# Patient Record
Sex: Male | Born: 1945
Health system: Southern US, Community
[De-identification: ages and names within clinical notes are randomized; demographics above are authoritative.]

## PROBLEM LIST (undated history)

## (undated) DIAGNOSIS — R269 Unspecified abnormalities of gait and mobility: Secondary | ICD-10-CM

## (undated) DIAGNOSIS — M4712 Other spondylosis with myelopathy, cervical region: Secondary | ICD-10-CM

## (undated) DIAGNOSIS — G20 Parkinson's disease: Secondary | ICD-10-CM

## (undated) DIAGNOSIS — G4700 Insomnia, unspecified: Secondary | ICD-10-CM

## (undated) DIAGNOSIS — F32A Depression, unspecified: Secondary | ICD-10-CM

## (undated) DIAGNOSIS — F4310 Post-traumatic stress disorder, unspecified: Secondary | ICD-10-CM

## (undated) DIAGNOSIS — F329 Major depressive disorder, single episode, unspecified: Secondary | ICD-10-CM

## (undated) DIAGNOSIS — M549 Dorsalgia, unspecified: Secondary | ICD-10-CM

## (undated) HISTORY — DX: Unspecified abnormalities of gait and mobility: R26.9

## (undated) HISTORY — DX: Post-traumatic stress disorder, unspecified: F43.10

## (undated) HISTORY — PX: SKIN CANCER EXCISION: SHX779

## (undated) HISTORY — DX: Other spondylosis with myelopathy, cervical region: M47.12

## (undated) HISTORY — PX: OTHER SURGICAL HISTORY: SHX169

## (undated) HISTORY — DX: Insomnia, unspecified: G47.00

## (undated) HISTORY — DX: Dorsalgia, unspecified: M54.9

## (undated) HISTORY — DX: Depression, unspecified: F32.A

## (undated) HISTORY — DX: Parkinson's disease: G20

## (undated) HISTORY — PX: SCALP LESION REMOVAL W/ FLAP AND SKIN GRAFT: SHX2376

## (undated) HISTORY — DX: Major depressive disorder, single episode, unspecified: F32.9

---

## 2003-09-30 ENCOUNTER — Other Ambulatory Visit: Payer: Self-pay

## 2006-12-11 ENCOUNTER — Ambulatory Visit: Payer: Self-pay | Admitting: General Surgery

## 2007-01-29 ENCOUNTER — Ambulatory Visit: Payer: Self-pay | Admitting: Specialist

## 2010-02-28 ENCOUNTER — Ambulatory Visit: Payer: Self-pay | Admitting: Orthopedic Surgery

## 2010-08-13 ENCOUNTER — Ambulatory Visit: Payer: Self-pay | Admitting: General Surgery

## 2010-08-27 ENCOUNTER — Ambulatory Visit: Payer: Self-pay | Admitting: General Surgery

## 2010-08-27 LAB — HM COLONOSCOPY

## 2014-01-06 ENCOUNTER — Encounter: Payer: Self-pay | Admitting: Neurology

## 2014-01-06 ENCOUNTER — Ambulatory Visit (INDEPENDENT_AMBULATORY_CARE_PROVIDER_SITE_OTHER): Payer: Non-veteran care | Admitting: Neurology

## 2014-01-06 VITALS — BP 163/79 | HR 66 | Ht 72.5 in | Wt 207.0 lb

## 2014-01-06 DIAGNOSIS — R269 Unspecified abnormalities of gait and mobility: Secondary | ICD-10-CM

## 2014-01-06 DIAGNOSIS — R209 Unspecified disturbances of skin sensation: Secondary | ICD-10-CM | POA: Diagnosis not present

## 2014-01-06 DIAGNOSIS — M4712 Other spondylosis with myelopathy, cervical region: Secondary | ICD-10-CM

## 2014-01-06 HISTORY — DX: Other spondylosis with myelopathy, cervical region: M47.12

## 2014-01-06 NOTE — Patient Instructions (Signed)

## 2014-01-06 NOTE — Progress Notes (Signed)
Reason for visit: Cervical myelopathy  Darren Thomas. is a 68 y.o. male  History of present illness:  Darren Thomas is a 68 year old right-handed white male with a history of a cervical myelopathy. The patient began having symptoms around 2011 with numbness in the right hand. The patient felt to have carpal tunnel syndrome, and he underwent a decompressive surgery for this without benefit. The sensory symptoms progressed to include all 4 extremities, and he also developed a gait disorder with spasticity in both legs. He eventually was found to have multilevel cervical spine disease, with cord compression and injury at the C4-5 level. The patient underwent a multilevel posterior laminectomy, but following surgery, his gait disorder and sensory symptoms did not resolve. The patient indicates that over time, he has had some gradual progression of some of the numbness in the hands and legs, and he continues to have an occasional shock sensations down the arms and legs. The patient will have episodes where the legs will take out and go into spasms. The patient denies issues controlling the bowels or the bladder, but he does have some chronic constipation issues. The patient has difficulty using his hands because of the numbness. The patient denies any falls, and he does not use a cane or a walker for ambulation. He comes to this office for an evaluation of the persistent symptoms. MRI evaluation done in April 2015 was brought for my review, and it does show evidence of spinal cord injury with high signal intensity at the C4-5 level within the spinal cord. The patient has had good decompression of the spinal canal.  Past Medical History  Diagnosis Date  . Back pain   . Cervical spondylosis with myelopathy 01/06/2014  . Abnormality of gait     Past Surgical History  Procedure Laterality Date  . C-spine      C-3-7 removeed    Family History  Problem Relation Age of Onset  . Cancer Mother   .  Heart Problems Father     Social history:  reports that he has quit smoking. His smoking use included Cigarettes. He smoked 1.00 pack per day. He has never used smokeless tobacco. He reports that he does not drink alcohol or use illicit drugs.  Medications:  No current outpatient prescriptions on file prior to visit.   No current facility-administered medications on file prior to visit.     No Known Allergies  ROS:  Out of a complete 14 system review of symptoms, the patient complains only of the following symptoms, and all other reviewed systems are negative.  Blurred vision Constipation Numbness, weakness  Blood pressure 163/79, pulse 66, height 6' 0.5" (1.842 m), weight 207 lb (93.895 kg).  Physical Exam  General: The patient is alert and cooperative at the time of the examination.  Eyes: Pupils are equal, round, and reactive to light. Discs are flat bilaterally.  Neck: The neck is supple, no carotid bruits are noted.  Respiratory: The respiratory examination is clear.  Cardiovascular: The cardiovascular examination reveals a regular rate and rhythm, no obvious murmurs or rubs are noted.  Skin: Extremities are without significant edema.  Neurologic Exam  Mental status: The patient is alert and oriented x 3 at the time of the examination. The patient has apparent normal recent and remote memory, with an apparently normal attention span and concentration ability.  Cranial nerves: Facial symmetry is not present. There is slight ptosis of the left eye. There is good sensation of  the face to pinprick and soft touch bilaterally. The strength of the facial muscles and the muscles to head turning and shoulder shrug are normal bilaterally. Speech is well enunciated, no aphasia or dysarthria is noted. Extraocular movements are full. Visual fields are full. The tongue is midline, and the patient has symmetric elevation of the soft palate. No obvious hearing deficits are  noted.  Motor: The motor testing reveals 5 over 5 strength of all 4 extremities. Good symmetric motor tone is noted throughout.  Sensory: Sensory testing is intact to pinprick, soft touch, vibration sensation, and position sense on all 4 extremities, with the exception that there is a stocking pattern pinprick sensory deficit in the distal third of the legs bilaterally. Vibration sensation is slightly decreased on the left hand relative to the right, symmetric in the feet. No evidence of extinction is noted.  Coordination: Cerebellar testing reveals good finger-nose-finger and heel-to-shin bilaterally.  Gait and station: Gait is slightly wide-based, with a spastic-type gait, left greater than right lower extremity. Tandem gait is unsteady. Romberg is negative. No drift is seen.  Reflexes: Deep tendon reflexes are symmetric, but the biceps reflexes are depressed bilaterally, triceps reflexes and lower extremity reflexes are elevated bilaterally. Toes are downgoing bilaterally.   Assessment/Plan:  One. Cervical myelopathy  2. Gait disorder  The patient does have evidence of spinal cord injury by MRI of the cervical spine. The patient likely has permanent deficits associated with sensory alteration on all 4 extremities, and spasticity in both legs. A small proportion of individuals will have some progression of symptoms following decompressive surgery. The patient may benefit from a low dose of baclofen for the spasticity in the legs. He does not have any true weakness of the extremities. The patient will undergo blood work looking for other etiologies of his sensory and motor complaints. The patient will followup through this office if needed. He gets his medical care through the [facility].  Jill Alexanders MD 01/08/2014 3:24 PM  [facility] [address] [place], [place] [address]  Phone [phone] Fax [phone]

## 2014-01-12 LAB — COPPER, SERUM: Copper: 88 ug/dL (ref 72–166)

## 2014-01-12 LAB — HIV ANTIBODY (ROUTINE TESTING W REFLEX): HIV-1/HIV-2 Ab: NONREACTIVE

## 2014-01-12 LAB — ANA W/REFLEX: Anti Nuclear Antibody(ANA): NEGATIVE

## 2014-01-12 LAB — HTLV-I/II ANTIBODIES, QUAL.: HTLV I/II Ab: NEGATIVE

## 2014-01-12 LAB — VITAMIN B12: VITAMIN B 12: 1277 pg/mL — AB (ref 211–946)

## 2014-01-12 LAB — LYME, TOTAL AB TEST/REFLEX: Lyme IgG/IgM Ab: <0.91 {ISR} (ref 0.00–0.90)

## 2014-01-12 LAB — RPR: RPR: NONREACTIVE

## 2014-01-12 LAB — ANGIOTENSIN CONVERTING ENZYME: ANGIO CONVERT ENZYME: 40 U/L (ref 14–82)

## 2014-01-12 LAB — METHYLMALONIC ACID, SERUM: METHYLMALONIC ACID: 164 nmol/L (ref 0–378)

## 2014-01-12 NOTE — Progress Notes (Signed)
Quick Note:  I called and gave him results of labs.(unremarkable). He verbalized understanding. ______

## 2014-01-25 ENCOUNTER — Ambulatory Visit: Payer: Non-veteran care | Admitting: Neurology

## 2014-03-29 ENCOUNTER — Encounter: Payer: Self-pay | Admitting: Neurology

## 2014-04-04 ENCOUNTER — Encounter: Payer: Self-pay | Admitting: Neurology

## 2014-05-24 DIAGNOSIS — L821 Other seborrheic keratosis: Secondary | ICD-10-CM | POA: Diagnosis not present

## 2014-05-24 DIAGNOSIS — D1800 Hemangioma unspecified site: Secondary | ICD-10-CM | POA: Diagnosis not present

## 2014-05-24 DIAGNOSIS — Z1283 Encounter for screening for malignant neoplasm of skin: Secondary | ICD-10-CM | POA: Diagnosis not present

## 2014-05-24 DIAGNOSIS — D179 Benign lipomatous neoplasm, unspecified: Secondary | ICD-10-CM | POA: Diagnosis not present

## 2014-05-24 DIAGNOSIS — L814 Other melanin hyperpigmentation: Secondary | ICD-10-CM | POA: Diagnosis not present

## 2014-05-24 DIAGNOSIS — I8310 Varicose veins of unspecified lower extremity with inflammation: Secondary | ICD-10-CM | POA: Diagnosis not present

## 2014-05-24 DIAGNOSIS — Z85828 Personal history of other malignant neoplasm of skin: Secondary | ICD-10-CM | POA: Diagnosis not present

## 2014-05-24 DIAGNOSIS — L578 Other skin changes due to chronic exposure to nonionizing radiation: Secondary | ICD-10-CM | POA: Diagnosis not present

## 2014-06-01 DIAGNOSIS — G629 Polyneuropathy, unspecified: Secondary | ICD-10-CM | POA: Diagnosis not present

## 2014-06-01 DIAGNOSIS — Z13220 Encounter for screening for lipoid disorders: Secondary | ICD-10-CM | POA: Diagnosis not present

## 2014-06-01 DIAGNOSIS — Z136 Encounter for screening for cardiovascular disorders: Secondary | ICD-10-CM | POA: Diagnosis not present

## 2014-06-01 DIAGNOSIS — Z9181 History of falling: Secondary | ICD-10-CM | POA: Diagnosis not present

## 2014-06-01 DIAGNOSIS — Z131 Encounter for screening for diabetes mellitus: Secondary | ICD-10-CM | POA: Diagnosis not present

## 2014-06-01 DIAGNOSIS — Z125 Encounter for screening for malignant neoplasm of prostate: Secondary | ICD-10-CM | POA: Diagnosis not present

## 2014-06-01 DIAGNOSIS — Z1389 Encounter for screening for other disorder: Secondary | ICD-10-CM | POA: Diagnosis not present

## 2014-06-01 DIAGNOSIS — R739 Hyperglycemia, unspecified: Secondary | ICD-10-CM | POA: Diagnosis not present

## 2014-09-12 DIAGNOSIS — J309 Allergic rhinitis, unspecified: Secondary | ICD-10-CM | POA: Diagnosis not present

## 2014-09-12 DIAGNOSIS — H6121 Impacted cerumen, right ear: Secondary | ICD-10-CM | POA: Diagnosis not present

## 2014-11-20 ENCOUNTER — Telehealth: Payer: Self-pay | Admitting: Family Medicine

## 2014-11-20 MED ORDER — FLUTICASONE PROPIONATE 50 MCG/ACT NA SUSP: 1.0000 | Freq: Every day | NASAL | Status: DC

## 2014-11-20 NOTE — Telephone Encounter (Signed)
Requesting 4M supply of Fluticasone sent to [facility].

## 2014-11-20 NOTE — Telephone Encounter (Signed)
Patient would like a 3 month supply sent to mail order pharmacy.

## 2014-11-21 NOTE — Telephone Encounter (Signed)
Refill Request.  

## 2014-11-22 MED ORDER — FLUTICASONE PROPIONATE 50 MCG/ACT NA SUSP: 2.0000 | Freq: Every day | NASAL | Status: DC

## 2014-11-22 NOTE — Addendum Note (Signed)
Addended by: Steele Sizer F on: 11/22/2014 10:03 AM   Modules accepted: Orders

## 2015-05-30 DIAGNOSIS — L812 Freckles: Secondary | ICD-10-CM | POA: Diagnosis not present

## 2015-05-30 DIAGNOSIS — L821 Other seborrheic keratosis: Secondary | ICD-10-CM | POA: Diagnosis not present

## 2015-05-30 DIAGNOSIS — D229 Melanocytic nevi, unspecified: Secondary | ICD-10-CM | POA: Diagnosis not present

## 2015-05-30 DIAGNOSIS — L820 Inflamed seborrheic keratosis: Secondary | ICD-10-CM | POA: Diagnosis not present

## 2015-05-30 DIAGNOSIS — Z85828 Personal history of other malignant neoplasm of skin: Secondary | ICD-10-CM | POA: Diagnosis not present

## 2015-05-30 DIAGNOSIS — L578 Other skin changes due to chronic exposure to nonionizing radiation: Secondary | ICD-10-CM | POA: Diagnosis not present

## 2015-05-30 DIAGNOSIS — Z1283 Encounter for screening for malignant neoplasm of skin: Secondary | ICD-10-CM | POA: Diagnosis not present

## 2015-05-30 DIAGNOSIS — I8312 Varicose veins of left lower extremity with inflammation: Secondary | ICD-10-CM | POA: Diagnosis not present

## 2015-05-30 DIAGNOSIS — I8311 Varicose veins of right lower extremity with inflammation: Secondary | ICD-10-CM | POA: Diagnosis not present

## 2015-09-10 ENCOUNTER — Other Ambulatory Visit: Payer: Self-pay | Admitting: Family Medicine

## 2015-09-11 NOTE — Telephone Encounter (Signed)
Patient requesting refill. 

## 2015-09-25 ENCOUNTER — Encounter: Payer: Commercial Managed Care - HMO | Admitting: Family Medicine

## 2016-02-05 ENCOUNTER — Encounter: Payer: Self-pay | Admitting: Family Medicine

## 2016-02-05 ENCOUNTER — Ambulatory Visit (INDEPENDENT_AMBULATORY_CARE_PROVIDER_SITE_OTHER): Payer: Commercial Managed Care - HMO | Admitting: Family Medicine

## 2016-02-05 VITALS — BP 116/42 | HR 51 | Temp 98.1°F | Wt 201.1 lb

## 2016-02-05 DIAGNOSIS — Z23 Encounter for immunization: Secondary | ICD-10-CM | POA: Diagnosis not present

## 2016-02-05 DIAGNOSIS — R0982 Postnasal drip: Secondary | ICD-10-CM

## 2016-02-05 DIAGNOSIS — J329 Chronic sinusitis, unspecified: Secondary | ICD-10-CM | POA: Diagnosis not present

## 2016-02-05 DIAGNOSIS — J3089 Other allergic rhinitis: Principal | ICD-10-CM

## 2016-02-05 DIAGNOSIS — J309 Allergic rhinitis, unspecified: Secondary | ICD-10-CM

## 2016-02-05 DIAGNOSIS — J302 Other seasonal allergic rhinitis: Secondary | ICD-10-CM | POA: Insufficient documentation

## 2016-02-05 MED ORDER — AMOXICILLIN-POT CLAVULANATE 875-125 MG PO TABS: 1.0000 | ORAL_TABLET | Freq: Two times a day (BID) | ORAL | 0 refills | Status: DC

## 2016-02-05 MED ORDER — B-12 1000 MCG SL SUBL: 1.0000 | SUBLINGUAL_TABLET | Freq: Every day | SUBLINGUAL | 0 refills | Status: DC

## 2016-02-05 MED ORDER — LORATADINE 10 MG PO TABS: 10.0000 mg | ORAL_TABLET | Freq: Every day | ORAL | 11 refills | Status: DC

## 2016-02-05 NOTE — Progress Notes (Signed)
Name: Darren Thomas.   MRN: [id number]    DOB: 04-30-1946   Date:02/05/2016       Progress Note  Subjective  Chief Complaint  Chief Complaint  Patient presents with  . Sinusitis    drainage    HPI  AR: he states he has been using flonase and loratadine every day, however over the past couple of months symptoms are getting worse, he has more post-nasal drainage through the day and has to constant clear his throat, and bothers him at night. The mucus is white, no fever, chills , change in appetite or facial pain   Patient Active Problem List   Diagnosis Date Noted  . Perennial allergic rhinitis with seasonal variation 02/05/2016  . Cervical spondylosis with myelopathy 01/06/2014    Past Surgical History:  Procedure Laterality Date  . C-spine     C-3-7 removeed    Family History  Problem Relation Age of Onset  . Cancer Mother   . Heart Problems Father     Social History   Social History  . Marital status: Married    Spouse name: N/A  . Number of children: 2  . Years of education: GED   Occupational History  . retired    Social History Main Topics  . Smoking status: Former Smoker    Packs/day: 1.00    Types: Cigarettes  . Smokeless tobacco: Never Used  . Alcohol use No     Comment: former  . Drug use: No  . Sexual activity: Not on file   Other Topics Concern  . Not on file   Social History Narrative  . No narrative on file     Current Outpatient Prescriptions:  .  fluticasone (FLONASE) 50 MCG/ACT nasal spray, USE 2 SPRAYS IN EACH NOSTRIL EVERY DAY, Disp: 48 g, Rfl: 1 .  gabapentin (NEURONTIN) 400 MG capsule, Take 3,600 mg by mouth daily. Three 400 mg caps 3 times daily, Disp: , Rfl:  .  Multiple Vitamins-Minerals (COMPLETE MULTIVITAMIN/MINERAL PO), Take 1 tablet by mouth daily., Disp: , Rfl:  .  naproxen sodium (ANAPROX) 220 MG tablet, Take 220 mg by mouth 2 (two) times daily with a meal., Disp: , Rfl:  .  Omega-3 Fatty Acids (FISH OIL) 1200 MG CAPS,  Take 3 capsules by mouth daily., Disp: , Rfl:  .  polyethylene glycol (MIRALAX / GLYCOLAX) packet, Take 17 g by mouth every other day., Disp: , Rfl:  .  Cyanocobalamin (B-12) 1000 MCG SUBL, Place 1 tablet under the tongue daily., Disp: 30 each, Rfl: 0 .  FIBER LAXATIVE 625 MG tablet, Take 1 tablet by mouth daily., Disp: , Rfl:  .  loratadine (CLARITIN) 10 MG tablet, Take 1 tablet (10 mg total) by mouth daily., Disp: 30 tablet, Rfl: 11 .  STOOL SOFTENER 100 MG capsule, Take 100 mg by mouth daily., Disp: , Rfl:   No Known Allergies   ROS  Ten systems reviewed and is negative except as mentioned in HPI   Objective  Vitals:   02/05/16 1547  BP: (!) 116/42  Pulse: (!) 51  Temp: 98.1 F (36.7 C)  SpO2: 96%  Weight: 201 lb 1.6 oz (91.2 kg)    Body mass index is 26.9 kg/m.  Physical Exam  Constitutional: Patient appears well-developed and well-nourished.No distress.  HEENT: head atraumatic, normocephalic, pupils equal and reactive to light, ears normal,  neck supple, throat within normal limits, white post-nasal drainage, no tenderness during percussion of sinus  Cardiovascular: Normal  rate, regular rhythm and normal heart sounds.  No murmur heard. No BLE edema. Pulmonary/Chest: Effort normal and breath sounds normal. No respiratory distress. Abdominal: Soft.  There is no tenderness. Psychiatric: Patient has a normal mood and affect. behavior is normal. Judgment and thought content normal.  PHQ2/9: Depression screen PHQ 2/9 02/05/2016  Decreased Interest 0  Down, Depressed, Hopeless 0  PHQ - 2 Score 0     Fall Risk: Fall Risk  02/05/2016  Falls in the past year? No     Functional Status Survey: Is the patient deaf or have difficulty hearing?: Yes Does the patient have difficulty seeing, even when wearing glasses/contacts?: Yes (glasses) Does the patient have difficulty concentrating, remembering, or making decisions?: Yes Does the patient have difficulty walking or  climbing stairs?: No Does the patient have difficulty dressing or bathing?: No Does the patient have difficulty doing errands alone such as visiting a doctor's office or shopping?: No    Assessment & Plan  1. Perennial allergic rhinitis with seasonal variation  - loratadine (CLARITIN) 10 MG tablet; Take 1 tablet (10 mg total) by mouth daily.  Dispense: 30 tablet; Refill: 11  2. Needs flu shot  - Flu vaccine HIGH DOSE PF  3. Post-nasal drainage  - amoxicillin-clavulanate (AUGMENTIN) 875-125 MG tablet; Take 1 tablet by mouth 2 (two) times daily.  Dispense: 28 tablet; Refill: 0

## 2016-02-08 ENCOUNTER — Telehealth: Payer: Self-pay

## 2016-02-08 NOTE — Telephone Encounter (Signed)
Patient called stating since taking Augmentin he has been experiencing Diarrhea. Just wanted advise on what to do?

## 2016-02-08 NOTE — Telephone Encounter (Signed)
Try probiotics and if no improvement we will change antibiotics

## 2016-05-14 LAB — BASIC METABOLIC PANEL
BUN: 22 mg/dL — AB (ref 4–21)
CREATININE: 1 mg/dL (ref 0.6–1.3)
Glucose: 79 mg/dL
Potassium: 4.7 mmol/L (ref 3.4–5.3)
SODIUM: 141 mmol/L (ref 137–147)

## 2016-05-14 LAB — HEPATIC FUNCTION PANEL
ALK PHOS: 75 U/L (ref 25–125)
ALT: 13 U/L (ref 10–40)
AST: 15 U/L (ref 14–40)
Bilirubin, Total: 0.7 mg/dL

## 2016-05-14 LAB — CBC AND DIFFERENTIAL
HEMATOCRIT: 44 % (ref 41–53)
Hemoglobin: 15.1 g/dL (ref 13.5–17.5)
PLATELETS: 166 10*3/uL (ref 150–399)
WBC: 5.4 10^3/mL

## 2016-05-14 LAB — TSH: TSH: 1.48 u[IU]/mL (ref 0.41–5.90)

## 2016-05-14 LAB — PSA: PSA: 0.6

## 2016-06-06 ENCOUNTER — Encounter: Payer: Self-pay | Admitting: Family Medicine

## 2016-06-06 ENCOUNTER — Ambulatory Visit (INDEPENDENT_AMBULATORY_CARE_PROVIDER_SITE_OTHER): Payer: Commercial Managed Care - HMO | Admitting: Family Medicine

## 2016-06-06 VITALS — BP 114/58 | HR 81 | Temp 97.9°F | Resp 18 | Ht 72.0 in | Wt 190.1 lb

## 2016-06-06 DIAGNOSIS — R634 Abnormal weight loss: Secondary | ICD-10-CM

## 2016-06-06 DIAGNOSIS — R0982 Postnasal drip: Secondary | ICD-10-CM

## 2016-06-06 DIAGNOSIS — J302 Other seasonal allergic rhinitis: Secondary | ICD-10-CM | POA: Diagnosis not present

## 2016-06-06 DIAGNOSIS — M4712 Other spondylosis with myelopathy, cervical region: Secondary | ICD-10-CM | POA: Diagnosis not present

## 2016-06-06 DIAGNOSIS — J3089 Other allergic rhinitis: Secondary | ICD-10-CM | POA: Diagnosis not present

## 2016-06-06 MED ORDER — AZITHROMYCIN 500 MG PO TABS: 500.0000 mg | ORAL_TABLET | Freq: Every day | ORAL | 0 refills | Status: DC

## 2016-06-06 MED ORDER — LORATADINE 10 MG PO TABS: 10.0000 mg | ORAL_TABLET | Freq: Every day | ORAL | 3 refills | Status: DC

## 2016-06-06 MED ORDER — FLUTICASONE PROPIONATE 50 MCG/ACT NA SUSP: 2.0000 | Freq: Every day | NASAL | 1 refills | Status: DC

## 2016-06-06 NOTE — Progress Notes (Signed)
Name: Darren Thomas.   MRN: [id number]    DOB: 09/05/1945   Date:06/06/2016       Progress Note  Subjective  Chief Complaint  Chief Complaint  Patient presents with  . Sinus Problem    Patient states he is having the same symptoms as in September, Post nasal drainage, coughing-clear mucus, congestion.   . Weight Loss    Would like to discuss weight loss- has lost 11 pounds without trying    HPI  Post-nasal drainage:  He was seen in September 2017 with allergic rhinitis symptoms but severe post-nasal drainage, he was given Augmentin but he was only able to take 3 days because it caused diarrhea. He states symptoms improved slightly but much worse over the past month. He feels like he is choking on his post-nasal drainage, no facial pressure or fever, no taste sensation change. He has been using allergy medication as prescribed.   Weight loss: he states he has lost weight, unintentionally over the past 4 months. He states he had been stable at 200 lbs weight for over one year and his weight has dropped 11 lbs since last visit in 01/2016. He noticed a change in his pant and shirt size. He went to the [place] this month and had multiple labs done. He denies change in appetite,change in bowel movements, no change in moles. He has been off gabapentin for the past month because it was causing swelling, but he states pain is not much different. He was given Cymbalta but felt like it was causing swelling on his throat , so he stopped medication.    Patient Active Problem List   Diagnosis Date Noted  . Perennial allergic rhinitis with seasonal variation 02/05/2016  . Cervical spondylosis with myelopathy 01/06/2014    Past Surgical History:  Procedure Laterality Date  . C-spine     C-3-7 removeed    Family History  Problem Relation Age of Onset  . Cancer Mother   . Heart Problems Father     Social History   Social History  . Marital status: Married    Spouse name: N/A  . Number of  children: 2  . Years of education: GED   Occupational History  . retired    Social History Main Topics  . Smoking status: Former Smoker    Packs/day: 1.00    Types: Cigarettes  . Smokeless tobacco: Never Used  . Alcohol use No     Comment: former  . Drug use: No  . Sexual activity: Not on file   Other Topics Concern  . Not on file   Social History Narrative  . No narrative on file     Current Outpatient Prescriptions:  .  Cyanocobalamin (B-12) 1000 MCG SUBL, Place 1 tablet under the tongue daily., Disp: 30 each, Rfl: 0 .  fluticasone (FLONASE) 50 MCG/ACT nasal spray, Place 2 sprays into both nostrils daily., Disp: 48 g, Rfl: 1 .  loratadine (CLARITIN) 10 MG tablet, Take 1 tablet (10 mg total) by mouth daily., Disp: 90 tablet, Rfl: 3 .  Multiple Vitamins-Minerals (COMPLETE MULTIVITAMIN/MINERAL PO), Take 1 tablet by mouth daily., Disp: , Rfl:  .  naproxen sodium (ANAPROX) 220 MG tablet, Take 220 mg by mouth 2 (two) times daily with a meal., Disp: , Rfl:  .  Omega-3 Fatty Acids (FISH OIL) 1200 MG CAPS, Take 3 capsules by mouth daily., Disp: , Rfl:  .  polyethylene glycol (MIRALAX / GLYCOLAX) packet, Take 17 g by mouth  every other day., Disp: , Rfl:  .  azithromycin (ZITHROMAX) 500 MG tablet, Take 1 tablet (500 mg total) by mouth daily., Disp: 3 tablet, Rfl: 0  Allergies  Allergen Reactions  . Augmentin [Amoxicillin-Pot Clavulanate] Diarrhea     ROS  Ten systems reviewed and is negative except as mentioned in HPI   Objective  Vitals:   06/06/16 1550  BP: (!) 114/58  Pulse: 81  Resp: 18  Temp: 97.9 F (36.6 C)  TempSrc: Oral  SpO2: 97%  Weight: 190 lb 1.6 oz (86.2 kg)  Height: 6' (1.829 m)    Body mass index is 25.78 kg/m.  Physical Exam  Constitutional: Patient appears well-developed and well-nourished.  No distress.  HEENT: head atraumatic, normocephalic, pupils equal and reactive to light, ears normal TM bilaterally, boggy turbinates, no tenderness  during percussion of sinus , neck supple, throat within normal limits, post-nasal drainage - white in color  Cardiovascular: Normal rate, regular rhythm and normal heart sounds.  No murmur heard. No BLE edema. Pulmonary/Chest: Effort normal and breath sounds normal. No respiratory distress. Abdominal: Soft.  There is no tenderness. Psychiatric: Patient has a normal mood and affect. behavior is normal. Judgment and thought content normal.  PHQ2/9: Depression screen [facility] 2/9 06/06/2016 02/05/2016  Decreased Interest 0 0  Down, Depressed, Hopeless 0 0  PHQ - 2 Score 0 0    Fall Risk: Fall Risk  06/06/2016 02/05/2016  Falls in the past year? No No     Functional Status Survey: Is the patient deaf or have difficulty hearing?: Yes (bilateral hearing aids) Does the patient have difficulty seeing, even when wearing glasses/contacts?: No Does the patient have difficulty concentrating, remembering, or making decisions?: No Does the patient have difficulty walking or climbing stairs?: No Does the patient have difficulty dressing or bathing?: No Does the patient have difficulty doing errands alone such as visiting a doctor's office or shopping?: No    Assessment & Plan  1. Post-nasal drainage  - azithromycin (ZITHROMAX) 500 MG tablet; Take 1 tablet (500 mg total) by mouth daily.  Dispense: 3 tablet; Refill: 0  2. Perennial allergic rhinitis with seasonal variation  - loratadine (CLARITIN) 10 MG tablet; Take 1 tablet (10 mg total) by mouth daily.  Dispense: 90 tablet; Refill: 3 - fluticasone (FLONASE) 50 MCG/ACT nasal spray; Place 2 sprays into both nostrils daily.  Dispense: 48 g; Refill: 1  3. Abnormal weight loss  He went to [place] this month and had multiple labs done, advised to return for follow up with a copy of his labs so we can decide what other labs/evaluation he needs  4. Cervical spondylosis with myelopathy  He is off gabapentin and cymbalta, he has numbness on both hands and he  has a daily pain 5-6/10 on average, no change since he stopped medication

## 2016-06-11 DIAGNOSIS — L908 Other atrophic disorders of skin: Secondary | ICD-10-CM | POA: Diagnosis not present

## 2016-06-11 DIAGNOSIS — N6009 Solitary cyst of unspecified breast: Secondary | ICD-10-CM | POA: Diagnosis not present

## 2016-06-11 DIAGNOSIS — L578 Other skin changes due to chronic exposure to nonionizing radiation: Secondary | ICD-10-CM | POA: Diagnosis not present

## 2016-06-11 DIAGNOSIS — C44212 Basal cell carcinoma of skin of right ear and external auricular canal: Secondary | ICD-10-CM | POA: Diagnosis not present

## 2016-06-11 DIAGNOSIS — L814 Other melanin hyperpigmentation: Secondary | ICD-10-CM | POA: Diagnosis not present

## 2016-06-20 ENCOUNTER — Encounter: Payer: Self-pay | Admitting: Family Medicine

## 2016-06-20 ENCOUNTER — Ambulatory Visit (INDEPENDENT_AMBULATORY_CARE_PROVIDER_SITE_OTHER): Payer: Commercial Managed Care - HMO | Admitting: Family Medicine

## 2016-06-20 VITALS — BP 132/68 | HR 90 | Temp 98.1°F | Resp 16 | Ht 72.0 in | Wt 190.9 lb

## 2016-06-20 DIAGNOSIS — R634 Abnormal weight loss: Secondary | ICD-10-CM

## 2016-06-20 DIAGNOSIS — D692 Other nonthrombocytopenic purpura: Secondary | ICD-10-CM

## 2016-06-20 DIAGNOSIS — G4700 Insomnia, unspecified: Secondary | ICD-10-CM | POA: Insufficient documentation

## 2016-06-20 DIAGNOSIS — F419 Anxiety disorder, unspecified: Secondary | ICD-10-CM | POA: Diagnosis not present

## 2016-06-20 DIAGNOSIS — G4709 Other insomnia: Secondary | ICD-10-CM

## 2016-06-20 DIAGNOSIS — R0982 Postnasal drip: Secondary | ICD-10-CM | POA: Diagnosis not present

## 2016-06-20 DIAGNOSIS — F4310 Post-traumatic stress disorder, unspecified: Secondary | ICD-10-CM | POA: Insufficient documentation

## 2016-06-20 DIAGNOSIS — M4712 Other spondylosis with myelopathy, cervical region: Secondary | ICD-10-CM | POA: Diagnosis not present

## 2016-06-20 NOTE — Progress Notes (Signed)
Name: Darren Thomas.   MRN: [id number]    DOB: 03/11/1946   Date:06/20/2016       Progress Note  Subjective  Chief Complaint  Chief Complaint  Patient presents with  . Follow-up    2 week   . Weight Loss    Weight Unchanged from last visit.  . Insomnia    Only taking 1 Trazodone nightly, has not increased dosage up to 2 pills yet. Still waking up periodically throughout the night but not as bad as before medication.    HPI  Insomnia and anxiety: he is going to the [place] and was started on trazodone. He states he is only getting up at most twice during the night, he is getting evaluated by the [facility] to find out if he has PTSD  Weight loss: stable since last visit a couple of weeks ago, he brought labs from [place], and he had a normal CBC, TSH, comp panel and PSA. It may be secondary to anxiety also. Discussed high protein diet for now and frequent snacks.   Post-nasal drainage: doing better since he took Tri-pack and is taking Loratadine and using nasal steroid.    Patient Active Problem List   Diagnosis Date Noted  . Insomnia 06/20/2016  . Anxiety disorder 06/20/2016  . Perennial allergic rhinitis with seasonal variation 02/05/2016  . Cervical spondylosis with myelopathy 01/06/2014    Past Surgical History:  Procedure Laterality Date  . C-spine     C-3-7 removeed    Family History  Problem Relation Age of Onset  . Cancer Mother   . Heart Problems Father     Social History   Social History  . Marital status: Married    Spouse name: N/A  . Number of children: 2  . Years of education: GED   Occupational History  . retired    Social History Main Topics  . Smoking status: Former Smoker    Packs/day: 1.00    Types: Cigarettes  . Smokeless tobacco: Never Used  . Alcohol use No     Comment: former  . Drug use: No  . Sexual activity: Not on file   Other Topics Concern  . Not on file   Social History Narrative  . No narrative on file     Current Outpatient  Prescriptions:  .  Cyanocobalamin (B-12) 1000 MCG SUBL, Place 1 tablet under the tongue daily., Disp: 30 each, Rfl: 0 .  fluticasone (FLONASE) 50 MCG/ACT nasal spray, Place 2 sprays into both nostrils daily., Disp: 48 g, Rfl: 1 .  loratadine (CLARITIN) 10 MG tablet, Take 1 tablet (10 mg total) by mouth daily., Disp: 90 tablet, Rfl: 3 .  Multiple Vitamins-Minerals (COMPLETE MULTIVITAMIN/MINERAL PO), Take 1 tablet by mouth daily., Disp: , Rfl:  .  naproxen sodium (ANAPROX) 220 MG tablet, Take 220 mg by mouth 2 (two) times daily with a meal., Disp: , Rfl:  .  Omega-3 Fatty Acids (FISH OIL) 1200 MG CAPS, Take 3 capsules by mouth daily., Disp: , Rfl:  .  polyethylene glycol (MIRALAX / GLYCOLAX) packet, Take 17 g by mouth every other day., Disp: , Rfl:  .  traZODone (DESYREL) 50 MG tablet, Take 50-100 mg by mouth at bedtime as needed. for sleep, Disp: , Rfl: 0  Allergies  Allergen Reactions  . Augmentin [Amoxicillin-Pot Clavulanate] Diarrhea     ROS  Constitutional: Negative for fever or weight change.  Respiratory: Negative for cough and shortness of breath.   Cardiovascular: Negative for  chest pain or palpitations.  Gastrointestinal: Negative for abdominal pain, no bowel changes.  Musculoskeletal: Positive for gait problem but no joint swelling.  Skin: Negative for rash.  Neurological: Negative for dizziness or headache.  No other specific complaints in a complete review of systems (except as listed in HPI above).   Objective  Vitals:   06/20/16 1531  BP: 132/68  Pulse: 90  Resp: 16  Temp: 98.1 F (36.7 C)  TempSrc: Oral  SpO2: 97%  Weight: 190 lb 14.4 oz (86.6 kg)  Height: 6' (1.829 m)    Body mass index is 25.89 kg/m.  Physical Exam  Constitutional: Patient appears well-developed   No distress.  HEENT: head atraumatic, normocephalic, pupils equal and reactive to light, ears normal TM bilaterally, boggy turbinates,  Neck has decrease rom,  throat within normal  limits Cardiovascular: Normal rate, regular rhythm and normal heart sounds.  No murmur heard. No BLE edema. Pulmonary/Chest: Effort normal and breath sounds normal. No respiratory distress. Abdominal: Soft.  There is no tenderness. Psychiatric: Patient has a normal mood and affect. behavior is normal. Judgment and thought content normal. Skin: bruise on both arms  Recent Results (from the past 2160 hour(s))  CBC and differential     Status: None   Collection Time: 05/14/16 12:00 AM  Result Value Ref Range   Hemoglobin 15.1 13.5 - 17.5 g/dL   HCT 44 41 - 53 %   Platelets 166 150 - 399 K/L   WBC 5.4 34^9/ZP  Basic metabolic panel     Status: Abnormal   Collection Time: 05/14/16 12:00 AM  Result Value Ref Range   Glucose 79 mg/dL   BUN 22 (A) 4 - 21 mg/dL   Creatinine 1.0 0.6 - 1.3 mg/dL   Potassium 4.7 3.4 - 5.3 mmol/L   Sodium 141 137 - 147 mmol/L  Hepatic function panel     Status: None   Collection Time: 05/14/16 12:00 AM  Result Value Ref Range   Alkaline Phosphatase 75 25 - 125 U/L   ALT 13 10 - 40 U/L   AST 15 14 - 40 U/L   Bilirubin, Total 0.7 mg/dL  PSA     Status: None   Collection Time: 05/14/16 12:00 AM  Result Value Ref Range   PSA 0.6   TSH     Status: None   Collection Time: 05/14/16 12:00 AM  Result Value Ref Range   TSH 1.48 0.41 - 5.90 uIU/mL     PHQ2/9: Depression screen [facility] 2/9 06/06/2016 02/05/2016  Decreased Interest 0 0  Down, Depressed, Hopeless 0 0  PHQ - 2 Score 0 0     Fall Risk: Fall Risk  06/06/2016 02/05/2016  Falls in the past year? No No     Assessment & Plan  1. Other insomnia  [facility] and [facility] dose of Trazodone to 100 mg if needed.   2. Cervical spondylosis with myelopathy  Off gabapentin and has daily pain and paresthesia, he stopped medication because it was not helping with symptoms   3. Abnormal weight loss  Stable now, and labs reviewed   4. Post-nasal drainage  Improved with  antibiotics, continue allergy medication   5. Anxiety disorder, unspecified type  Getting evaluated for PTSD   6. Senile purpura ([place])  Reassurance

## 2016-11-05 ENCOUNTER — Other Ambulatory Visit: Payer: Self-pay | Admitting: Family Medicine

## 2016-11-05 DIAGNOSIS — J302 Other seasonal allergic rhinitis: Secondary | ICD-10-CM

## 2016-11-05 DIAGNOSIS — J3089 Other allergic rhinitis: Principal | ICD-10-CM

## 2016-11-05 NOTE — Telephone Encounter (Signed)
Patient requesting refill of Flonase to [facility].

## 2016-11-07 ENCOUNTER — Ambulatory Visit (INDEPENDENT_AMBULATORY_CARE_PROVIDER_SITE_OTHER): Payer: Medicare HMO | Admitting: Family Medicine

## 2016-11-07 ENCOUNTER — Encounter: Payer: Self-pay | Admitting: Family Medicine

## 2016-11-07 VITALS — BP 118/78 | HR 62 | Temp 98.2°F | Resp 16 | Ht 72.0 in | Wt 188.9 lb

## 2016-11-07 DIAGNOSIS — Z23 Encounter for immunization: Secondary | ICD-10-CM

## 2016-11-07 DIAGNOSIS — J3089 Other allergic rhinitis: Secondary | ICD-10-CM

## 2016-11-07 DIAGNOSIS — G4709 Other insomnia: Secondary | ICD-10-CM

## 2016-11-07 DIAGNOSIS — K5909 Other constipation: Secondary | ICD-10-CM | POA: Diagnosis not present

## 2016-11-07 DIAGNOSIS — R0982 Postnasal drip: Secondary | ICD-10-CM

## 2016-11-07 DIAGNOSIS — D692 Other nonthrombocytopenic purpura: Secondary | ICD-10-CM

## 2016-11-07 DIAGNOSIS — M4712 Other spondylosis with myelopathy, cervical region: Secondary | ICD-10-CM | POA: Diagnosis not present

## 2016-11-07 DIAGNOSIS — F419 Anxiety disorder, unspecified: Secondary | ICD-10-CM

## 2016-11-07 DIAGNOSIS — J302 Other seasonal allergic rhinitis: Secondary | ICD-10-CM

## 2016-11-07 NOTE — Progress Notes (Signed)
Name: Darren Thomas.   MRN: [id number]    DOB: 13-Mar-1946   Date:11/07/2016       Progress Note  Subjective  Chief Complaint  Chief Complaint  Patient presents with  . Nasal Congestion    Patient is having nasal drainage, dry throat and has to clear his throat 4-5x daily with white phlegm. Last medication that was prescribed gave him diarrhea and had to stop it.     HPI   Insomnia and anxiety: he is going to the [place] and was started on trazodone. He states he is only getting up at most twice during the night, he is getting evaluated by the [facility] to find out if he has PTSD  Weight loss: now weight has been stable past 4 months. He states at home 182 lbs- 184 lbs. Labs done and normal.   Post-nasal drainage: he states chronic problems, saw Dr. Pryor Ochoa in the past, taking Loratadine and using nasal steroid, every time he comes in he wants antibiotics, advised to follow up with Dr. Pryor Ochoa again. Antibiotics do not seem to clear his symptoms   Cervical spondylosis with  Myelopathy: he had surgery in May 2011, but neuropathy got worse after surgery, he tried multiple medications: Cymbalta, high dose Gabapentin and Lyrica, but symptoms did not improve and he stopped all medications. He states does not affect his strength but affects his balance at times and coordination.   Patient Active Problem List   Diagnosis Date Noted  . Chronic constipation 11/07/2016  . Insomnia 06/20/2016  . PTSD (post-traumatic stress disorder) 06/20/2016  . Perennial allergic rhinitis with seasonal variation 02/05/2016  . Cervical spondylosis with myelopathy 01/06/2014    Past Surgical History:  Procedure Laterality Date  . C-spine     C-3-7 removeed    Family History  Problem Relation Age of Onset  . Cancer Mother   . Heart Problems Father     Social History   Social History  . Marital status: Married    Spouse name: N/A  . Number of children: 2  . Years of education: GED   Occupational History   . retired    Social History Main Topics  . Smoking status: Former Smoker    Packs/day: 1.00    Types: Cigarettes  . Smokeless tobacco: Never Used  . Alcohol use No     Comment: former  . Drug use: No  . Sexual activity: Not on file   Other Topics Concern  . Not on file   Social History Narrative  . No narrative on file     Current Outpatient Prescriptions:  .  Cyanocobalamin (B-12) 1000 MCG SUBL, Place 1 tablet under the tongue daily., Disp: 30 each, Rfl: 0 .  escitalopram (LEXAPRO) 20 MG tablet, Take 20 mg by mouth daily., Disp: , Rfl: 2 .  fluticasone (FLONASE) 50 MCG/ACT nasal spray, USE 2 SPRAYS IN EACH NOSTRIL EVERY DAY, Disp: 48 g, Rfl: 1 .  loratadine (CLARITIN) 10 MG tablet, Take 1 tablet (10 mg total) by mouth daily., Disp: 90 tablet, Rfl: 3 .  Multiple Vitamins-Minerals (COMPLETE MULTIVITAMIN/MINERAL PO), Take 1 tablet by mouth daily., Disp: , Rfl:  .  naproxen sodium (ANAPROX) 220 MG tablet, Take 220 mg by mouth 2 (two) times daily with a meal., Disp: , Rfl:  .  Omega-3 Fatty Acids (FISH OIL) 1200 MG CAPS, Take 3 capsules by mouth daily., Disp: , Rfl:  .  polyethylene glycol (MIRALAX / GLYCOLAX) packet, Take 17 g by  mouth every other day., Disp: , Rfl:  .  traZODone (DESYREL) 50 MG tablet, Take 50-100 mg by mouth at bedtime as needed. for sleep, Disp: , Rfl: 0  Allergies  Allergen Reactions  . Augmentin [Amoxicillin-Pot Clavulanate] Diarrhea     ROS  Constitutional: Negative for fever or weight change.  Respiratory: Positive for occasional cough - from post-nasal drainage but no  shortness of breath.   Cardiovascular: Negative for chest pain or palpitations.  Gastrointestinal: Negative for abdominal pain, no bowel changes.  Musculoskeletal: Positive  for gait problem but no joint swelling.  Skin: Negative for rash.  Neurological: Negative for dizziness or headache.  No other specific complaints in a complete review of systems (except as listed in HPI  above).  Objective  Vitals:   11/07/16 1134  BP: 118/78  Pulse: 62  Resp: 16  Temp: 98.2 F (36.8 C)  TempSrc: Oral  SpO2: 96%  Weight: 188 lb 14.4 oz (85.7 kg)  Height: 6' (1.829 m)    Body mass index is 25.62 kg/m.  Physical Exam  Constitutional: Patient appears well-developed and well-nourished. No distress.  HEENT: head atraumatic, normocephalic, pupils equal and reactive to light,  neck supple, throat within normal limits Cardiovascular: Normal rate, regular rhythm and normal heart sounds.  No murmur heard. No BLE edema. Pulmonary/Chest: Effort normal and breath sounds normal. No respiratory distress. Abdominal: Soft.  There is no tenderness. Psychiatric: Patient has a normal mood and affect. behavior is normal. Judgment and thought content normal. Skin: upper arm purpura and stasis dermatitis lower leg Muscular skeletal: strong grip but has hand atrophy   PHQ2/9: Depression screen [facility] 2/9 06/06/2016 02/05/2016  Decreased Interest 0 0  Down, Depressed, Hopeless 0 0  PHQ - 2 Score 0 0     Fall Risk: Fall Risk  11/07/2016 06/06/2016 02/05/2016  Falls in the past year? No No No     Functional Status Survey: Is the patient deaf or have difficulty hearing?: No Does the patient have difficulty seeing, even when wearing glasses/contacts?: No Does the patient have difficulty concentrating, remembering, or making decisions?: No Does the patient have difficulty walking or climbing stairs?: No Does the patient have difficulty dressing or bathing?: No Does the patient have difficulty doing errands alone such as visiting a doctor's office or shopping?: No   Assessment & Plan  1. Cervical spondylosis with myelopathy  He stopped Gabapentin because it was not working, he continues to have numbness/tingling on hands and feet, with pain all the time. He has tried Cymbalta in the past also   2. Senile purpura (HCC)  stable  3. Perennial allergic rhinitis with seasonal  variation  He asks for antibiotics every time he comes in, advised to continue allergy medication   4. Anxiety disorder, unspecified type  [facility]  5. Other insomnia  Controlled with mediation  6. Chronic constipation  Doing well with Miralax   7. Need for pneumococcal vaccine  - Pneumococcal conjugate vaccine 13-valent IM  8. Post-nasal drainage  We will hold off on antibiotics at this time, go back to Dr. Pryor Ochoa   - Ambulatory referral to ENT

## 2016-12-08 DIAGNOSIS — J320 Chronic maxillary sinusitis: Secondary | ICD-10-CM | POA: Diagnosis not present

## 2016-12-08 DIAGNOSIS — J342 Deviated nasal septum: Secondary | ICD-10-CM | POA: Diagnosis not present

## 2016-12-08 DIAGNOSIS — H6123 Impacted cerumen, bilateral: Secondary | ICD-10-CM | POA: Diagnosis not present

## 2016-12-08 DIAGNOSIS — J301 Allergic rhinitis due to pollen: Secondary | ICD-10-CM | POA: Diagnosis not present

## 2016-12-19 ENCOUNTER — Ambulatory Visit: Payer: Commercial Managed Care - HMO | Admitting: Family Medicine

## 2016-12-31 DIAGNOSIS — J342 Deviated nasal septum: Secondary | ICD-10-CM | POA: Diagnosis not present

## 2016-12-31 DIAGNOSIS — J340 Abscess, furuncle and carbuncle of nose: Secondary | ICD-10-CM | POA: Diagnosis not present

## 2017-05-15 ENCOUNTER — Ambulatory Visit
Admission: RE | Admit: 2017-05-15 | Discharge: 2017-05-15 | Disposition: A | Discharge status: Home / Self Care | Payer: Medicare HMO | Source: Ambulatory Visit | Attending: Family Medicine | Admitting: Family Medicine

## 2017-05-15 ENCOUNTER — Ambulatory Visit (INDEPENDENT_AMBULATORY_CARE_PROVIDER_SITE_OTHER): Payer: Medicare HMO

## 2017-05-15 ENCOUNTER — Ambulatory Visit: Payer: Medicare HMO | Admitting: Family Medicine

## 2017-05-15 ENCOUNTER — Encounter: Payer: Medicare HMO | Admitting: Family Medicine

## 2017-05-15 ENCOUNTER — Encounter: Payer: Self-pay | Admitting: Family Medicine

## 2017-05-15 VITALS — BP 102/60 | HR 60 | Temp 98.0°F | Resp 16 | Ht 72.0 in | Wt 180.4 lb

## 2017-05-15 VITALS — BP 102/60 | HR 60 | Temp 98.0°F | Resp 12 | Ht 72.0 in | Wt 180.4 lb

## 2017-05-15 DIAGNOSIS — R739 Hyperglycemia, unspecified: Secondary | ICD-10-CM

## 2017-05-15 DIAGNOSIS — K5909 Other constipation: Secondary | ICD-10-CM | POA: Diagnosis not present

## 2017-05-15 DIAGNOSIS — F4310 Post-traumatic stress disorder, unspecified: Secondary | ICD-10-CM | POA: Diagnosis not present

## 2017-05-15 DIAGNOSIS — R634 Abnormal weight loss: Secondary | ICD-10-CM | POA: Diagnosis not present

## 2017-05-15 DIAGNOSIS — R1311 Dysphagia, oral phase: Secondary | ICD-10-CM | POA: Diagnosis not present

## 2017-05-15 DIAGNOSIS — D692 Other nonthrombocytopenic purpura: Secondary | ICD-10-CM

## 2017-05-15 DIAGNOSIS — R9431 Abnormal electrocardiogram [ECG] [EKG]: Secondary | ICD-10-CM | POA: Diagnosis not present

## 2017-05-15 DIAGNOSIS — M4712 Other spondylosis with myelopathy, cervical region: Secondary | ICD-10-CM

## 2017-05-15 DIAGNOSIS — Z87891 Personal history of nicotine dependence: Secondary | ICD-10-CM

## 2017-05-15 DIAGNOSIS — D696 Thrombocytopenia, unspecified: Secondary | ICD-10-CM | POA: Diagnosis not present

## 2017-05-15 DIAGNOSIS — E785 Hyperlipidemia, unspecified: Secondary | ICD-10-CM

## 2017-05-15 DIAGNOSIS — R64 Cachexia: Secondary | ICD-10-CM | POA: Diagnosis not present

## 2017-05-15 DIAGNOSIS — Z79899 Other long term (current) drug therapy: Secondary | ICD-10-CM | POA: Diagnosis not present

## 2017-05-15 DIAGNOSIS — Z0000 Encounter for general adult medical examination without abnormal findings: Secondary | ICD-10-CM | POA: Diagnosis not present

## 2017-05-15 NOTE — Patient Instructions (Signed)
Darren Thomas , Thank you for taking time to come for your Medicare Wellness Visit. I appreciate your ongoing commitment to your health goals. Please review the following plan we discussed and let me know if I can assist you in the future.   Screening recommendations/referrals: Colonoscopy: Completed colonoscopy 08/27/10. Repeat every 10 years. Lung: I will send a message to Darren Estelle, RN for this screening. She will call you if you qualify for this screening. Hepatitis C Screening: Completed 03/31/12 HIV Screening: Completed 01/06/14   Vision/Dental Exams: Recommended yearly ophthalmology/optometry visit for glaucoma screening and checkup Recommended yearly dental visit for hygiene and checkup  Vaccinations: Influenza vaccine: Declined. Please provide a copy of your immunization record. Pneumococcal vaccine: Completed PCV13 11/07/16 and PPSV23 02/20/10 Tdap vaccine: Declined. Please call your insurance company to determine your out of pocket expense. You may also receive this vaccine at your local pharmacy or Health Dept. Shingles vaccine: Completed Zostavax 08/12/12. Declined Shingrix. Please call your insurance company to determine your out of pocket expense. You may also receive this vaccine at your local pharmacy or Health Dept.  Advanced directives: Please bring a copy of your POA (Power of Attorney) and/or Living Will to your next appointment.   Conditions/risks identified: Recommend to drink at least 6-8 8oz glasses of water per day  Next appointment: You are scheduled to see Dr. Ancil Boozer on 05/19/17 @ 9:20am.   Please schedule your Annual Wellness Visit with your Nurse Health Advisor in one year.  Preventive Care 35 Years and Older, Male Preventive care refers to lifestyle choices and visits with your health care provider that can promote health and wellness. What does preventive care include?  A yearly physical exam. This is also called an annual well check.  Dental exams once or  twice a year.  Routine eye exams. Ask your health care provider how often you should have your eyes checked.  Personal lifestyle choices, including:  Daily care of your teeth and gums.  Regular physical activity.  Eating a healthy diet.  Avoiding tobacco and drug use.  Limiting alcohol use.  Practicing safe sex.  Taking low doses of aspirin every day.  Taking vitamin and mineral supplements as recommended by your health care provider. What happens during an annual well check? The services and screenings done by your health care provider during your annual well check will depend on your age, overall health, lifestyle risk factors, and family history of disease. Counseling  Your health care provider may ask you questions about your:  Alcohol use.  Tobacco use.  Drug use.  Emotional well-being.  Home and relationship well-being.  Sexual activity.  Eating habits.  History of falls.  Memory and ability to understand (cognition).  Work and work Statistician. Screening  You may have the following tests or measurements:  Height, weight, and BMI.  Blood pressure.  Lipid and cholesterol levels. These may be checked every 5 years, or more frequently if you are over 59 years old.  Skin check.  Lung cancer screening. You may have this screening every year starting at age 36 if you have a 30-pack-year history of smoking and currently smoke or have quit within the past 15 years.  Fecal occult blood test (FOBT) of the stool. You may have this test every year starting at age 83.  Flexible sigmoidoscopy or colonoscopy. You may have a sigmoidoscopy every 5 years or a colonoscopy every 10 years starting at age 18.  Prostate cancer screening. Recommendations will vary depending on  your family history and other risks.  Hepatitis C blood test.  Hepatitis B blood test.  Sexually transmitted disease (STD) testing.  Diabetes screening. This is done by checking your blood  sugar (glucose) after you have not eaten for a while (fasting). You may have this done every 1-3 years.  Abdominal aortic aneurysm (AAA) screening. You may need this if you are a current or former smoker.  Osteoporosis. You may be screened starting at age 43 if you are at high risk. Talk with your health care provider about your test results, treatment options, and if necessary, the need for more tests. Vaccines  Your health care provider may recommend certain vaccines, such as:  Influenza vaccine. This is recommended every year.  Tetanus, diphtheria, and acellular pertussis (Tdap, Td) vaccine. You may need a Td booster every 10 years.  Zoster vaccine. You may need this after age 62.  Pneumococcal 13-valent conjugate (PCV13) vaccine. One dose is recommended after age 18.  Pneumococcal polysaccharide (PPSV23) vaccine. One dose is recommended after age 80. Talk to your health care provider about which screenings and vaccines you need and how often you need them. This information is not intended to replace advice given to you by your health care provider. Make sure you discuss any questions you have with your health care provider. Document Released: 05/25/2015 Document Revised: 01/16/2016 Document Reviewed: 02/27/2015 Elsevier Interactive Patient Education  2017 Day Prevention in the Home Falls can cause injuries. They can happen to people of all ages. There are many things you can do to make your home safe and to help prevent falls. What can I do on the outside of my home?  Regularly fix the edges of walkways and driveways and fix any cracks.  Remove anything that might make you trip as you walk through a door, such as a raised step or threshold.  Trim any bushes or trees on the path to your home.  Use bright outdoor lighting.  Clear any walking paths of anything that might make someone trip, such as rocks or tools.  Regularly check to see if handrails are loose or  broken. Make sure that both sides of any steps have handrails.  Any raised decks and porches should have guardrails on the edges.  Have any leaves, snow, or ice cleared regularly.  Use sand or salt on walking paths during winter.  Clean up any spills in your garage right away. This includes oil or grease spills. What can I do in the bathroom?  Use night lights.  Install grab bars by the toilet and in the tub and shower. Do not use towel bars as grab bars.  Use non-skid mats or decals in the tub or shower.  If you need to sit down in the shower, use a plastic, non-slip stool.  Keep the floor dry. Clean up any water that spills on the floor as soon as it happens.  Remove soap buildup in the tub or shower regularly.  Attach bath mats securely with double-sided non-slip rug tape.  Do not have throw rugs and other things on the floor that can make you trip. What can I do in the bedroom?  Use night lights.  Make sure that you have a light by your bed that is easy to reach.  Do not use any sheets or blankets that are too big for your bed. They should not hang down onto the floor.  Have a firm chair that has side arms. You  can use this for support while you get dressed.  Do not have throw rugs and other things on the floor that can make you trip. What can I do in the kitchen?  Clean up any spills right away.  Avoid walking on wet floors.  Keep items that you use a lot in easy-to-reach places.  If you need to reach something above you, use a strong step stool that has a grab bar.  Keep electrical cords out of the way.  Do not use floor polish or wax that makes floors slippery. If you must use wax, use non-skid floor wax.  Do not have throw rugs and other things on the floor that can make you trip. What can I do with my stairs?  Do not leave any items on the stairs.  Make sure that there are handrails on both sides of the stairs and use them. Fix handrails that are  broken or loose. Make sure that handrails are as long as the stairways.  Check any carpeting to make sure that it is firmly attached to the stairs. Fix any carpet that is loose or worn.  Avoid having throw rugs at the top or bottom of the stairs. If you do have throw rugs, attach them to the floor with carpet tape.  Make sure that you have a light switch at the top of the stairs and the bottom of the stairs. If you do not have them, ask someone to add them for you. What else can I do to help prevent falls?  Wear shoes that:  Do not have high heels.  Have rubber bottoms.  Are comfortable and fit you well.  Are closed at the toe. Do not wear sandals.  If you use a stepladder:  Make sure that it is fully opened. Do not climb a closed stepladder.  Make sure that both sides of the stepladder are locked into place.  Ask someone to hold it for you, if possible.  Clearly mark and make sure that you can see:  Any grab bars or handrails.  First and last steps.  Where the edge of each step is.  Use tools that help you move around (mobility aids) if they are needed. These include:  Canes.  Walkers.  Scooters.  Crutches.  Turn on the lights when you go into a dark area. Replace any light bulbs as soon as they burn out.  Set up your furniture so you have a clear path. Avoid moving your furniture around.  If any of your floors are uneven, fix them.  If there are any pets around you, be aware of where they are.  Review your medicines with your doctor. Some medicines can make you feel dizzy. This can increase your chance of falling. Ask your doctor what other things that you can do to help prevent falls. This information is not intended to replace advice given to you by your health care provider. Make sure you discuss any questions you have with your health care provider. Document Released: 02/22/2009 Document Revised: 10/04/2015 Document Reviewed: 06/02/2014 Elsevier  Interactive Patient Education  2017 [employer].

## 2017-05-15 NOTE — Progress Notes (Signed)
Name: Darren Thomas.   MRN: [id number]    DOB: 1946/02/16   Date:05/15/2017       Progress Note  Subjective  Chief Complaint  Chief Complaint  Patient presents with  . Weight Loss  . Follow-up    HPI  Weight loss: he has been gradually losing weight, over the past year he has lost another 10 lbs. He denies any cough, SOB or change in bowel movements. He has noticed dysphagia only when he swallows large pills. He thinks secondary to having difficulty looking up ( from neck problems). Not dysphagia for liquids or solids. He has been more depressed and is seeing psychiatrist. Explained that it may be a factor on his weight loss. He would like to find out if there is any other causes for his weight loss.   Constipation: doing better lately, no straining or blood in stools.   Cervical myelopathy: he still has atrophy on hands, difficulty with his gait and has fallen once over the past year but states he tripped and does not want referral for PT.  He is seeing specialist through [facility]. He has daily pain but states medications have not helped him in the past    Patient Active Problem List   Diagnosis Date Noted  . Chronic constipation 11/07/2016  . Insomnia 06/20/2016  . PTSD (post-traumatic stress disorder) 06/20/2016  . Perennial allergic rhinitis with seasonal variation 02/05/2016  . Cervical spondylosis with myelopathy 01/06/2014    Past Surgical History:  Procedure Laterality Date  . C-spine     C-3-7 removeed  . SCALP LESION REMOVAL W/ FLAP AND SKIN GRAFT      Family History  Problem Relation Age of Onset  . Cancer Mother   . Heart Problems Father   . Asthma Brother     Social History   Socioeconomic History  . Marital status: Married    Spouse name: Derl Barrow  . Number of children: 2  . 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 Highest education level: 8th grade  Social Needs  . Financial resource strain: Not hard at all  . Food insecurity - worry: Never true  .  Food insecurity - inability: Never true  . Transportation needs - medical: No  . Transportation needs - non-medical: No  Occupational History  . Occupation: retired  Tobacco Use  . Smoking status: Former Smoker    Packs/day: 1.00    Years: 20.00    Pack years: 20.00    Types: Cigarettes    Last attempt to quit: 1973    Years since quitting: 46.0  . Smokeless tobacco: Never Used  . Tobacco comment: smoking cessation materials not required  Substance and Sexual Activity  . Alcohol use: No  . Drug use: No  . Sexual activity: Not Currently  Other Topics Concern  . Not on file  Social History Narrative  . Not on file     Current Outpatient Medications:  .  buPROPion (WELLBUTRIN XL) 300 MG 24 hr tablet, Take 300 mg by mouth daily., Disp: , Rfl:  .  Cyanocobalamin (B-12) 1000 MCG SUBL, Place 1 tablet under the tongue daily., Disp: 30 each, Rfl: 0 .  escitalopram (LEXAPRO) 20 MG tablet, Take 20 mg by mouth daily., Disp: , Rfl: 2 .  fluticasone (FLONASE) 50 MCG/ACT nasal spray, USE 2 SPRAYS IN EACH NOSTRIL EVERY DAY, Disp: 48 g, Rfl: 1 .  loratadine (CLARITIN) 10 MG tablet, Take 1 tablet (10 mg total) by mouth  daily., Disp: 90 tablet, Rfl: 3 .  Multiple Vitamins-Minerals (COMPLETE MULTIVITAMIN/MINERAL PO), Take 1 tablet by mouth daily., Disp: , Rfl:  .  naproxen sodium (ANAPROX) 220 MG tablet, Take 220 mg by mouth 2 (two) times daily with a meal., Disp: , Rfl:  .  Omega-3 Fatty Acids (FISH OIL) 1200 MG CAPS, Take 3 capsules by mouth daily., Disp: , Rfl:  .  polyethylene glycol (MIRALAX / GLYCOLAX) packet, Take 17 g by mouth every other day., Disp: , Rfl:  .  prazosin (MINIPRESS) 1 MG capsule, Take 5 mg by mouth at bedtime. Take 3 capsules at bedtime, Disp: , Rfl:  .  traZODone (DESYREL) 50 MG tablet, Take 50-100 mg by mouth at bedtime as needed. for sleep, Disp: , Rfl: 0  Allergies  Allergen Reactions  . Augmentin [Amoxicillin-Pot Clavulanate] Diarrhea     ROS  Constitutional:  Negative for fever , positive for weight change.  Respiratory: Negative for cough and shortness of breath.   Cardiovascular: Negative for chest pain or palpitations.  Gastrointestinal: Negative for abdominal pain, no bowel changes.  Musculoskeletal: Postive for gait problem - from cervical myelopathy  or joint swelling.  Skin: Negative for rash.  Neurological: Negative for dizziness or headache.  No other specific complaints in a complete review of systems (except as listed in HPI above).  Objective   Vitals:   05/15/17 1216  BP: 102/60  Pulse: 60  Resp: 16  Temp: 98 F (36.7 C)  TempSrc: Oral  SpO2: 98%  Weight: 180 lb 6.4 oz (81.8 kg)  Height: 6' (1.829 m)    Body mass index is 24.47 kg/m.  Physical Exam  Constitutional: Patient appears well-developed No distress.  HEENT: head atraumatic, normocephalic, pupils equal and reactive to light, right lazy eye, no thyromegaly, throat within normal limits Cardiovascular: Normal rate, regular rhythm and normal heart sounds.  No murmur heard. No BLE edema. Pulmonary/Chest: Effort normal and breath sounds normal. No respiratory distress. Abdominal: Soft.  There is no tenderness. Psychiatric: Patient has a normal mood and affect. behavior is normal. Judgment and thought content normal. Muscular Skeletal: slow gait, decrease rom of neck, atrophy both hands.   PHQ2/9: Depression screen [facility] 2/9 05/15/2017 06/06/2016 02/05/2016  Decreased Interest 1 0 0  Down, Depressed, Hopeless 1 0 0  PHQ - 2 Score 2 0 0  Altered sleeping 1 - -  Tired, decreased energy 1 - -  Change in appetite 1 - -  Feeling bad or failure about yourself  1 - -  Trouble concentrating 1 - -  Moving slowly or fidgety/restless 1 - -  Suicidal thoughts 1 - -  PHQ-9 Score 9 - -  Difficult doing work/chores Somewhat difficult - -   Seeing psychiatrist.    Fall Risk: Fall Risk  05/15/2017 11/07/2016 06/06/2016 02/05/2016  Falls in the past year? Yes No No No  Number  falls in past yr: 1 - - -  Injury with Fall? No - - -  Risk for fall due to : Impaired balance/gait;Impaired mobility;Impaired vision - - -  Risk for fall due to: Comment shuffling gait; wears eyeglasses - - -  Follow up Falls evaluation completed;Education provided;Falls prevention discussed - - -     Assessment & Plan  1. Weight loss  He wants to have evaluation for weight loss, including cancer screen - TSH - CBC with Differential/Platelet - COMPLETE METABOLIC PANEL WITH GFR - HIV antibody - PSA - C-reactive protein - Sedimentation rate - DG  Chest 2 View; Future  2. Senile purpura (HCC)  stable  3. Hyperglycemia  - Hemoglobin A1c  4. Dyslipidemia  - Lipid panel  5. PTSD (post-traumatic stress disorder)  Seeing a psychiatrist from [place]   6. Chronic constipation  better  7. History of tobacco use  - DG Chest 2 View; Future  8. Oral phase dysphagia  - SLP modified barium swallow; Future  9. Cervical spondylosis with myelopathy  Spinal cord injury disorder, through the [place]

## 2017-05-15 NOTE — Progress Notes (Signed)
Pt has had weight loss of approx. 2-3 pounds per week over the last few months. Noticed pt is down 8 pounds since 10/2016. Pt expressed concerns with both weight loss and nutritional intake. Denies N/V/D. Discussed pt concerns with Dr. Ancil Boozer. Agreed to see pt today for further evaluation.

## 2017-05-15 NOTE — Progress Notes (Signed)
Subjective:   Darren Thomas. is a 72 y.o. male who presents for Medicare Annual/Subsequent preventive examination.  Review of Systems:  N/A Cardiac Risk Factors include: advanced age (>80men, >2 women);dyslipidemia;male gender     Objective:    Vitals: BP 102/60 (BP Location: Left Arm, Patient Position: Sitting, Cuff Size: Normal)   Pulse 60   Temp 98 F (36.7 C) (Oral)   Resp 12   Ht 6' (1.829 m)   Wt 180 lb 6.4 oz (81.8 kg)   BMI 24.47 kg/m   Body mass index is 24.47 kg/m.  Advanced Directives 05/15/2017 11/07/2016 02/05/2016  Does Patient Have a Medical Advance Directive? Yes No No  Type of Paramedic of [place];Living will - -  Copy of [place] in Chart? No - copy requested - -   Pt has been provided with the "MOST" and "DNR" documents for his review. Pt has been advised that he will only need to complete the document that is most appropriate to suit his health care wishes. Once completed, pt has been advised to return the appropriate document to the office for the physician to review and sign. Verbalized acceptance and understanding.  Tobacco Social History   Tobacco Use  Smoking Status Former Smoker  . Packs/day: 1.00  . Years: 20.00  . Pack years: 20.00  . Types: Cigarettes  . Last attempt to quit: 1973  . Years since quitting: 46.0  Smokeless Tobacco Never Used  Tobacco Comment   smoking cessation materials not required     Counseling given: No Comment: smoking cessation materials not required   Clinical Intake:  Pre-visit preparation completed: Yes  Pain : No/denies pain  BMI - recorded: 24.47 Nutritional Status: BMI of 19-24  Normal Nutritional Risks: Has the patient had any N/V/D within the last 2 months?  No Does the patient have any non-healing wounds?  No Has the patient had any unintentional weight loss or weight gain?  Yes. Unintentional weight loss (Pt noticed decrease in weight around 2-3  pounds every week; loss of appetite) Diabetes: No  How often do you need to have someone help you when you read instructions, pamphlets, or other written materials from your doctor or pharmacy?: 1 - Never  Interpreter Needed?: No  Information entered by :: Darren Pickles, LPN  Past Medical History:  Diagnosis Date  . Abnormality of gait   . Back pain   . Cervical spondylosis with myelopathy 01/06/2014  . Insomnia   . PTSD (post-traumatic stress disorder)    Past Surgical History:  Procedure Laterality Date  . C-spine     C-3-7 removeed  . SCALP LESION REMOVAL W/ FLAP AND SKIN GRAFT     Family History  Problem Relation Age of Onset  . Cancer Mother   . Heart Problems Father   . Asthma Brother    Social History   Socioeconomic History  . Marital status: Married    Spouse name: Darren Thomas  . Number of children: 2  . 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 Highest education level: 8th grade  Social Needs  . Financial resource strain: Not hard at all  . Food insecurity - worry: Never true  . Food insecurity - inability: Never true  . Transportation needs - medical: No  . Transportation needs - non-medical: No  Occupational History  . Occupation: retired  Tobacco Use  . Smoking status: Former Smoker    Packs/day: 1.00    Years:  20.00    Pack years: 20.00    Types: Cigarettes    Last attempt to quit: 1973    Years since quitting: 46.0  . Smokeless tobacco: Never Used  . Tobacco comment: smoking cessation materials not required  Substance and Sexual Activity  . Alcohol use: No  . Drug use: No  . Sexual activity: Not Currently  Other Topics Concern  . None  Social History Narrative  . None    Outpatient Encounter Medications as of 05/15/2017  Medication Sig  . buPROPion (WELLBUTRIN XL) 300 MG 24 hr tablet Take 300 mg by mouth daily.  . Cyanocobalamin (B-12) 1000 MCG SUBL Place 1 tablet under the tongue daily.  Marland Kitchen escitalopram (LEXAPRO) 20 MG tablet Take 20 mg by  mouth daily.  . fluticasone (FLONASE) 50 MCG/ACT nasal spray USE 2 SPRAYS IN EACH NOSTRIL EVERY DAY  . loratadine (CLARITIN) 10 MG tablet Take 1 tablet (10 mg total) by mouth daily.  . Multiple Vitamins-Minerals (COMPLETE MULTIVITAMIN/MINERAL PO) Take 1 tablet by mouth daily.  . Omega-3 Fatty Acids (FISH OIL) 1200 MG CAPS Take 3 capsules by mouth daily.  . polyethylene glycol (MIRALAX / GLYCOLAX) packet Take 17 g by mouth every other day.  . prazosin (MINIPRESS) 1 MG capsule Take 5 mg by mouth at bedtime. Take 3 capsules at bedtime  . traZODone (DESYREL) 50 MG tablet Take 50-100 mg by mouth at bedtime as needed. for sleep  . naproxen sodium (ANAPROX) 220 MG tablet Take 220 mg by mouth 2 (two) times daily with a meal.   No facility-administered encounter medications on file as of 05/15/2017.     Activities of Daily Living In your present state of health, do you have any difficulty performing the following activities: 05/15/2017 11/07/2016  Hearing? N N  Comment wears bilateral hearing aids -  Vision? N N  Comment wears eyeglasses -  Difficulty concentrating or making decisions? Y N  Comment short term memory loss -  Walking or climbing stairs? Y N  Comment shuffling gait -  Dressing or bathing? N N  Doing errands, shopping? N N  Preparing Food and eating ? N -  Comment wears full set upper and lower dentures -  Using the Toilet? N -  In the past six months, have you accidently leaked urine? N -  Do you have problems with loss of bowel control? N -  Managing your Medications? Y -  Comment wife puts medications in pill box -  Managing your Finances? N -  Housekeeping or managing your Housekeeping? N -  Some recent data might be hidden    Patient Care Team: Darren Sizer, MD as PCP - General (Family Medicine)   Assessment:   This is a routine wellness examination for [name].  Exercise Activities and Dietary recommendations Current Exercise Habits: Structured exercise class, Type of  exercise: walking, Time (Minutes): 60, Frequency (Times/Week): 5, Weekly Exercise (Minutes/Week): 300, Intensity: Mild, Exercise limited by: None identified  Goals    . DIET - INCREASE WATER INTAKE     Recommend to drink at least 6-8 8oz glasses of water per day       Fall Risk Fall Risk  05/15/2017 11/07/2016 06/06/2016 02/05/2016  Falls in the past year? Yes No No No  Number falls in past yr: 1 - - -  Injury with Fall? No - - -  Risk for fall due to : Impaired balance/gait;Impaired mobility;Impaired vision - - -  Risk for fall due to: Comment  shuffling gait; wears eyeglasses - - -  Follow up Falls evaluation completed;Education provided;Falls prevention discussed - - -   Is the patient's home free of loose throw rugs in walkways, pet beds, electrical cords, etc?   yes      Grab bars in the bathroom? yes  Denies use of a shower chair      Handrails on the stairs?   yes, exterior stairs only      Adequate lighting?   yes   Denies use of a cane, walker or w/c. Denies use of an elevated toilet seat.  Timed Get Up and Go Performed: Yes. Pt ambulated 10 feet within 20 sec. Gait slow and steady, slight shuffling when he walks. No use of an assistive device. No intervention required at this time.   Depression Screen PHQ 2/9 Scores 05/15/2017 06/06/2016 02/05/2016  PHQ - 2 Score 2 0 0  PHQ- 9 Score 9 - -    Cognitive Function     6CIT Screen 05/15/2017  What Year? 0 points  What month? 0 points  What time? 0 points  Count back from 20 0 points  Months in reverse 0 points  Repeat phrase 6 points  Total Score 6    Immunization History  Administered Date(s) Administered  . Influenza, High Dose Seasonal PF 02/05/2016  . Influenza-Unspecified 02/27/2014  . Pneumococcal Conjugate-13 11/07/2016  . Pneumococcal Polysaccharide-23 02/20/2010  . Zoster 08/12/2012    Qualifies for Shingles Vaccine? No. Completed Zostavax 08/12/12. Due for Shingrix vaccine. Education has been provided regarding  the importance of this vaccine. Pt has been advised to call his insurance company to determine his out of pocket expense. Advised he/she may also receive this vaccine at his local pharmacy or Health Dept. Verbalized acceptance and understanding.  Due for Tdap vaccine. Declined my offer to administer today. Education has been provided regarding the importance of this vaccine but still declined. Pt has been advised to call his insurance company to determine his out of pocket expense. Advised he may also receive this vaccine at his local pharmacy or Health Dept. Verbalized acceptance and understanding.  Due for flu vaccine. Declined my offer to administer today. Pt states he has received this vaccine at [facility]. Advised to provide a copy of his vaccination record. Verbalized acceptance and understanding.  Screening Tests Health Maintenance  Topic Date Due  . INFLUENZA VACCINE  12/10/2016  . TETANUS/TDAP  05/15/2018 (Originally 07/30/1964)  . COLONOSCOPY  08/26/2020  . Hepatitis C Screening  Completed  . PNA vac Low Risk Adult  Completed   Cancer Screenings: Lung: Low Dose CT Chest recommended if Age 68-80 years, 30 pack-year currently smoking OR have quit w/in 15years. Patient does qualify. An Epic message has been sent to Burgess Estelle, RN (Oncology Nurse Navigator) regarding the possible need for this exam. Raquel Sarna will review the patient's chart and review all previous imaging, then will collaborate with radiology to determine if the patient truly qualifies for the exam. If the patient qualifies, Raquel Sarna will order the Low Dose CT of the chest and will call the patient to schedule the exam.  Colorectal: Completed colonoscopy 08/27/10. Repeat every 10 years.  Additional Screenings: HIV Screening: Completed 01/06/14 Hepatitis C Screening: Completed 03/31/12    Plan:   In preparation for this patient's Medicare Annual Wellness visit, I have personally reviewed the following information in the  patient's chart:  A. Medical and Surgical History B. Office visits, Hospitalizations and ER visits within the last 12 months  C. Radiology, Laboratory and Pathological Reports (including those records in [facility]) D. Health Maintenance for accuracy and any attached, scanned or relevant reports or letters E.  Immunization History F.  Upcoming referrals and appointments G. Advance directives and Code Status  I have personally addressed the Medicare Wellness Questionnaire with the patient and have noted and/or up dated the following information:  A.  Current medications (including OTC medications) B. Medication Allergies C. Medical and Surgical History D.  Physicians on the patient's care team [place] Physical activity G. Functional ability and status H. Nutritional status I. Risk for fall and preventative measures (including TUG test) J. Use of alcohol, tobacco or illicit drugs  K.          Screenings such as hearing, vision, cognitive and depression L. Advance Directives and Code Status M. Realistic Patient Goals N. Financial and Social strains, if any  In addition, I have reviewed and discussed with the patient, certain preventive protocols, quality metrics, and best practice recommendations. To ensure quality care and proper  continuity of care, any concerns that arose during the Medicare Wellness visit were addressed with the patient's physician. A written personalized care plan for preventive services as well as general preventive health recommendations were provided to patient.  Signed,  Aleatha Borer, LPN  I have reviewed this encounter including the documentation in this note and/or discussed this patient with the provider, Tasheema Perrone LPN. I am certifying that I agree with the content of this note as supervising physician.  Darren Sizer, MD [facility] 05/15/2017, 1:24 PM

## 2017-05-16 LAB — HEMOGLOBIN A1C
EAG (MMOL/L): 5 (calc)
Hgb A1c MFr Bld: 4.8 % of total Hgb (ref <5.7)
Mean Plasma Glucose: 91 (calc)

## 2017-05-16 LAB — CBC WITH DIFFERENTIAL/PLATELET
BASOS ABS: 32 {cells}/uL (ref 0–200)
BASOS PCT: 0.7 %
EOS PCT: 0.7 %
Eosinophils Absolute: 32 cells/uL (ref 15–500)
HEMATOCRIT: 39.8 % (ref 38.5–50.0)
HEMOGLOBIN: 13.5 g/dL (ref 13.2–17.1)
LYMPHS ABS: 833 {cells}/uL — AB (ref 850–3900)
MCH: 29.8 pg (ref 27.0–33.0)
MCHC: 33.9 g/dL (ref 32.0–36.0)
MCV: 87.9 fL (ref 80.0–100.0)
MPV: 11.8 fL (ref 7.5–12.5)
Monocytes Relative: 6.8 %
NEUTROS ABS: 3299 {cells}/uL (ref 1500–7800)
Neutrophils Relative %: 73.3 %
Platelets: 136 10*3/uL — ABNORMAL LOW (ref 140–400)
RBC: 4.53 10*6/uL (ref 4.20–5.80)
RDW: 13.2 % (ref 11.0–15.0)
Total Lymphocyte: 18.5 %
WBC mixed population: 306 cells/uL (ref 200–950)
WBC: 4.5 10*3/uL (ref 3.8–10.8)

## 2017-05-16 LAB — COMPLETE METABOLIC PANEL WITH GFR
AG Ratio: 1.6 (calc) (ref 1.0–2.5)
ALBUMIN MSPROF: 3.9 g/dL (ref 3.6–5.1)
ALKALINE PHOSPHATASE (APISO): 63 U/L (ref 40–115)
ALT: 9 U/L (ref 9–46)
AST: 14 U/L (ref 10–35)
BUN: 21 mg/dL (ref 7–25)
CALCIUM: 9 mg/dL (ref 8.6–10.3)
CO2: 32 mmol/L (ref 20–32)
CREATININE: 1.08 mg/dL (ref 0.70–1.18)
Chloride: 105 mmol/L (ref 98–110)
GFR, EST AFRICAN AMERICAN: 78 mL/min/{1.73_m2} (ref >=60)
GFR, EST NON AFRICAN AMERICAN: 68 mL/min/{1.73_m2} (ref >=60)
GLOBULIN: 2.4 g/dL (ref 1.9–3.7)
Glucose, Bld: 73 mg/dL (ref 65–99)
Potassium: 4.7 mmol/L (ref 3.5–5.3)
Sodium: 142 mmol/L (ref 135–146)
TOTAL PROTEIN: 6.3 g/dL (ref 6.1–8.1)
Total Bilirubin: 0.6 mg/dL (ref 0.2–1.2)

## 2017-05-16 LAB — C-REACTIVE PROTEIN: CRP: 0.8 mg/L (ref <8.0)

## 2017-05-16 LAB — TSH: TSH: 1.01 m[IU]/L (ref 0.40–4.50)

## 2017-05-16 LAB — LIPID PANEL
CHOLESTEROL: 143 mg/dL (ref <200)
HDL: 51 mg/dL (ref >40)
LDL CHOLESTEROL (CALC): 77 mg/dL
Non-HDL Cholesterol (Calc): 92 mg/dL (calc) (ref <130)
TRIGLYCERIDES: 66 mg/dL (ref <150)
Total CHOL/HDL Ratio: 2.8 (calc) (ref <5.0)

## 2017-05-16 LAB — SEDIMENTATION RATE: Sed Rate: 2 mm/h (ref 0–20)

## 2017-05-16 LAB — HIV ANTIBODY (ROUTINE TESTING W REFLEX): HIV 1&2 Ab, 4th Generation: NONREACTIVE

## 2017-05-16 LAB — PSA: PSA: 0.5 ng/mL (ref <=4.0)

## 2017-05-19 ENCOUNTER — Ambulatory Visit: Payer: Medicare HMO

## 2017-05-19 ENCOUNTER — Telehealth: Payer: Self-pay | Admitting: *Deleted

## 2017-05-19 ENCOUNTER — Ambulatory Visit (INDEPENDENT_AMBULATORY_CARE_PROVIDER_SITE_OTHER): Payer: Medicare HMO | Admitting: Family Medicine

## 2017-05-19 ENCOUNTER — Encounter: Payer: Self-pay | Admitting: Family Medicine

## 2017-05-19 VITALS — BP 110/64 | HR 72 | Temp 97.9°F | Resp 16 | Ht 72.0 in | Wt 177.3 lb

## 2017-05-19 DIAGNOSIS — F4310 Post-traumatic stress disorder, unspecified: Secondary | ICD-10-CM | POA: Diagnosis not present

## 2017-05-19 DIAGNOSIS — Z0000 Encounter for general adult medical examination without abnormal findings: Secondary | ICD-10-CM

## 2017-05-19 DIAGNOSIS — Z0001 Encounter for general adult medical examination with abnormal findings: Secondary | ICD-10-CM | POA: Diagnosis not present

## 2017-05-19 DIAGNOSIS — R64 Cachexia: Secondary | ICD-10-CM | POA: Diagnosis not present

## 2017-05-19 DIAGNOSIS — D696 Thrombocytopenia, unspecified: Secondary | ICD-10-CM

## 2017-05-19 DIAGNOSIS — R9431 Abnormal electrocardiogram [ECG] [EKG]: Secondary | ICD-10-CM | POA: Diagnosis not present

## 2017-05-19 NOTE — Telephone Encounter (Signed)
Received referral for low dose lung cancer screening CT scan. Message left at phone number listed in EMR for patient to call me back to facilitate scheduling scan.  

## 2017-05-19 NOTE — Patient Instructions (Signed)
Premier shakes You can also add protein bars and snacks between meals.

## 2017-05-19 NOTE — Progress Notes (Signed)
Name: Darren Thomas.   MRN: [id number]    DOB: 06-25-1945   Date:05/19/2017       Progress Note  Subjective  Chief Complaint  Chief Complaint  Patient presents with  . Annual Exam    HPI  Patient presents for annual CPE and follow up weight loss and lab results  Diet: eats small meals, but states appetite has not changed much recently, but losing weigh.  Exercise: not very active because of neuropathy  Thrombocytopenia: he has senile purpura but denies recent epistaxis, gum bleeding or blood in stools   Cachexia: he has lost almost 20 lbs in about 18 months. He was about 230 lbs years ago, he initially changed his diet and was not worried, but has been gradually been losing weight since. He states not as hungry but not sure why weight is dropping. Lost 3 lbs in the past week. Reviewed labs with him, discussed that he needs better control of his depression, PTSD ( goes to the [place]), increase caloric intake - boost shakes, protein bars, cheese, yogurt and return in one month for follow up   IPSS Questionnaire (AUA-7): Over the past month.   1)  How often have you had a sensation of not emptying your bladder completely after you finish urinating?  0 - Not at all  2)  How often have you had to urinate again less than two hours after you finished urinating? 1 - Less than 1 time in 5  3)  How often have you found you stopped and started again several times when you urinated?  0 - Not at all  4) How difficult have you found it to postpone urination?  3 - About half the time  5) How often have you had a weak urinary stream?  0 - Not at all  6) How often have you had to push or strain to begin urination?  0 - Not at all  7) How many times did you most typically get up to urinate from the time you went to bed until the time you got up in the morning?  0 - None  Total score:  0-7 mildly symptomatic   8-19 moderately symptomatic   20-35 severely symptomatic   Depression:  Depression screen  [facility] 2/9 05/15/2017 06/06/2016 02/05/2016  Decreased Interest 1 0 0  Down, Depressed, Hopeless 1 0 0  PHQ - 2 Score 2 0 0  Altered sleeping 1 - -  Tired, decreased energy 1 - -  Change in appetite 1 - -  Feeling bad or failure about yourself  1 - -  Trouble concentrating 1 - -  Moving slowly or fidgety/restless 1 - -  Suicidal thoughts 1 - -  PHQ-9 Score 9 - -  Difficult doing work/chores Somewhat difficult - -    Hypertension: BP Readings from Last 3 Encounters:  05/19/17 110/64  05/15/17 102/60  05/15/17 102/60    Obesity: Wt Readings from Last 3 Encounters:  05/19/17 177 lb 4.8 oz (80.4 kg)  05/15/17 180 lb 6.4 oz (81.8 kg)  05/15/17 180 lb 6.4 oz (81.8 kg)   BMI Readings from Last 3 Encounters:  05/19/17 24.05 kg/m  05/15/17 24.47 kg/m  05/15/17 24.47 kg/m     Lipids:  Lab Results  Component Value Date   CHOL 143 05/15/2017   Lab Results  Component Value Date   HDL 51 05/15/2017   No results found for: [facility] Lab Results  Component Value Date  TRIG 66 05/15/2017   Lab Results  Component Value Date   CHOLHDL 2.8 05/15/2017   No results found for: LDLDIRECT Glucose:  Glucose, Bld  Date Value Ref Range Status  05/15/2017 73 65 - 99 mg/dL Final    Comment:    .            Fasting reference interval .       Colorectal cancer: due for repeat in 2022  Prostate cancer: enlarged prostate on CT done 2012 , negative PSA  Lab Results  Component Value Date   PSA 0.5 05/15/2017   PSA 0.6 05/14/2016   Lung cancer:  Low Dose CT Chest recommended if Age 74-80 years, 30 pack-year currently smoking OR have quit w/in 15years. Patient does not qualify.   AAA:  The USPSTF recommends one-time screening with ultrasonography in men ages 75 to 52 years who have ever smoked He had CT abdomen in 2012 negative for aneurysm   Aspirin: taking  ECG:  Today    Patient Active Problem List   Diagnosis Date Noted  . Chronic constipation 11/07/2016  . Insomnia  06/20/2016  . PTSD (post-traumatic stress disorder) 06/20/2016  . Perennial allergic rhinitis with seasonal variation 02/05/2016  . Cervical spondylosis with myelopathy 01/06/2014    Past Surgical History:  Procedure Laterality Date  . C-spine     C-3-7 removed  . SCALP LESION REMOVAL W/ FLAP AND SKIN GRAFT      Family History  Problem Relation Age of Onset  . Cervical cancer Mother   . Heart Problems Father   . Asthma Brother   . Emphysema Brother        was a smoker    Social History   Socioeconomic History  . Marital status: Married    Spouse name: Derl Barrow  . Number of children: 2  . 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 Highest education level: 8th grade  Social Needs  . Financial resource strain: Not hard at all  . Food insecurity - worry: Never true  . Food insecurity - inability: Never true  . Transportation needs - medical: No  . Transportation needs - non-medical: No  Occupational History  . Occupation: retired  Tobacco Use  . Smoking status: Former Smoker    Packs/day: 1.00    Years: 20.00    Pack years: 20.00    Types: Cigarettes    Last attempt to quit: 1973    Years since quitting: 46.0  . Smokeless tobacco: Never Used  . Tobacco comment: smoking cessation materials not required  Substance and Sexual Activity  . Alcohol use: No  . Drug use: No  . Sexual activity: Not Currently  Other Topics Concern  . Not on file  Social History Narrative  . Not on file     Current Outpatient Medications:  .  buPROPion (WELLBUTRIN XL) 300 MG 24 hr tablet, Take 300 mg by mouth daily., Disp: , Rfl:  .  Cyanocobalamin (B-12) 1000 MCG SUBL, Place 1 tablet under the tongue daily., Disp: 30 each, Rfl: 0 .  escitalopram (LEXAPRO) 20 MG tablet, Take 20 mg by mouth daily., Disp: , Rfl: 2 .  fluticasone (FLONASE) 50 MCG/ACT nasal spray, USE 2 SPRAYS IN EACH NOSTRIL EVERY DAY, Disp: 48 g, Rfl: 1 .  loratadine (CLARITIN) 10 MG tablet, Take 1 tablet (10 mg total)  by mouth daily., Disp: 90 tablet, Rfl: 3 .  Multiple Vitamins-Minerals (COMPLETE MULTIVITAMIN/MINERAL PO), Take 1 tablet by mouth daily.,  Disp: , Rfl:  .  naproxen sodium (ANAPROX) 220 MG tablet, Take 220 mg by mouth 2 (two) times daily with a meal., Disp: , Rfl:  .  Omega-3 Fatty Acids (FISH OIL) 1200 MG CAPS, Take 3 capsules by mouth daily., Disp: , Rfl:  .  polyethylene glycol (MIRALAX / GLYCOLAX) packet, Take 17 g by mouth every other day., Disp: , Rfl:  .  prazosin (MINIPRESS) 1 MG capsule, Take 5 mg by mouth at bedtime. Take 3 capsules at bedtime, Disp: , Rfl:  .  traZODone (DESYREL) 50 MG tablet, Take 50-100 mg by mouth at bedtime as needed. for sleep, Disp: , Rfl: 0  Allergies  Allergen Reactions  . Augmentin [Amoxicillin-Pot Clavulanate] Diarrhea     ROS   Constitutional: Negative for fever, positive for weight change.  Respiratory: Positive  for intermittent cough but no  shortness of breath.   Cardiovascular: Negative for chest pain or palpitations.  Gastrointestinal: Negative for abdominal pain, no bowel changes.  Musculoskeletal: Positive for gait problem ( secondary to neuropathy) or joint swelling.  Skin: Negative for rash.  Neurological: Negative for dizziness or headache.  No other specific complaints in a complete review of systems (except as listed in HPI above).   Objective  Vitals:   05/19/17 0941  BP: 110/64  Pulse: 72  Resp: 16  Temp: 97.9 F (36.6 C)  TempSrc: Oral  SpO2: 96%  Weight: 177 lb 4.8 oz (80.4 kg)  Height: 6' (1.829 m)    Body mass index is 24.05 kg/m.  Physical Exam  Constitutional: Patient appears well-developed and thin. No distress.  HENT: Head: Normocephalic and atraumatic. Ears: B TMs ok, no erythema or effusion; Nose: Nose normal. Mouth/Throat: Oropharynx is clear and moist. No oropharyngeal exudate.  Eyes: Conjunctivae and EOM are normal. Pupils are equal, round, and reactive to light. No scleral icterus.  Neck: decrease  rom, difficulty with extension and rotation. No JVD present. No thyromegaly present.  Cardiovascular: Normal rate, regular rhythm and normal heart sounds.  No murmur heard. No BLE edema. Pulmonary/Chest: Effort normal and breath sounds normal. No respiratory distress. Abdominal: Soft. Bowel sounds are normal, no distension. There is no tenderness. no masses MALE GENITALIA: Normal descended testes bilaterally, no masses palpated, no hernias, no lesions, no discharge RECTAL: Prostate normal size and consistency, no rectal masses or hemorrhoids Musculoskeletal: Normal range of motion, no joint effusions. No gross deformities Neurological: he is alert and oriented to person, place, and time. No cranial nerve deficit. Coordination, speech are normal. He has severe atrophy on both hands He has slow gait  Skin: Skin is warm and dry. He has purpura on upper extremity  Psychiatric: Patient has a normal mood and affect. behavior is normal. Judgment and thought content normal.  Recent Results (from the past 2160 hour(s))  TSH     Status: None   Collection Time: 05/15/17 12:19 PM  Result Value Ref Range   TSH 1.01 0.40 - 4.50 mIU/L  CBC with Differential/Platelet     Status: Abnormal   Collection Time: 05/15/17 12:19 PM  Result Value Ref Range   WBC 4.5 3.8 - 10.8 Thousand/uL   RBC 4.53 4.20 - 5.80 Million/uL   Hemoglobin 13.5 13.2 - 17.1 g/dL   HCT 39.8 38.5 - 50.0 %   MCV 87.9 80.0 - 100.0 fL   MCH 29.8 27.0 - 33.0 pg   MCHC 33.9 32.0 - 36.0 g/dL   RDW 13.2 11.0 - 15.0 %   Platelets 136 (  L) 140 - 400 Thousand/uL   MPV 11.8 7.5 - 12.5 fL   Neutro Abs 3,299 1,500 - 7,800 cells/uL   Lymphs Abs 833 (L) 850 - 3,900 cells/uL   WBC mixed population 306 200 - 950 cells/uL   Eosinophils Absolute 32 15 - 500 cells/uL   Basophils Absolute 32 0 - 200 cells/uL   Neutrophils Relative % 73.3 %   Total Lymphocyte 18.5 %   Monocytes Relative 6.8 %   Eosinophils Relative 0.7 %   Basophils Relative 0.7 %   COMPLETE METABOLIC PANEL WITH GFR     Status: None   Collection Time: 05/15/17 12:19 PM  Result Value Ref Range   Glucose, Bld 73 65 - 99 mg/dL    Comment: .            Fasting reference interval .    BUN 21 7 - 25 mg/dL   Creat 1.08 0.70 - 1.18 mg/dL    Comment: For patients >38 years of age, the reference limit for Creatinine is approximately 13% higher for people identified as African-American. .    GFR, Est Non African American 68 > OR = 60 mL/min/1.35m2   GFR, Est African American 78 > OR = 60 mL/min/1.45m2   BUN/Creatinine Ratio NOT APPLICABLE 6 - 22 (calc)   Sodium 142 135 - 146 mmol/L   Potassium 4.7 3.5 - 5.3 mmol/L   Chloride 105 98 - 110 mmol/L   CO2 32 20 - 32 mmol/L   Calcium 9.0 8.6 - 10.3 mg/dL   Total Protein 6.3 6.1 - 8.1 g/dL   Albumin 3.9 3.6 - 5.1 g/dL   Globulin 2.4 1.9 - 3.7 g/dL (calc)   AG Ratio 1.6 1.0 - 2.5 (calc)   Total Bilirubin 0.6 0.2 - 1.2 mg/dL   Alkaline phosphatase (APISO) 63 40 - 115 U/L   AST 14 10 - 35 U/L   ALT 9 9 - 46 U/L  HIV antibody     Status: None   Collection Time: 05/15/17 12:19 PM  Result Value Ref Range   HIV 1&2 Ab, 4th Generation NON-REACTIVE NON-REACTI    Comment: HIV-1 antigen and HIV-1/HIV-2 antibodies were not detected. There is no laboratory evidence of HIV infection. Marland Kitchen PLEASE NOTE: This information has been disclosed to you from records whose confidentiality may be protected by state law.  If your state requires such protection, then the state law prohibits you from making any further disclosure of the information without the specific written consent of the person to whom it pertains, or as otherwise permitted by law. A general authorization for the release of medical or other information is NOT sufficient for this purpose. . For additional information please refer to http://education.[facility].com/faq/FAQ106 (This link is being provided for informational/ educational purposes only.) . Marland Kitchen The  performance of this assay has not been clinically validated in patients less than 33 years old. .   Hemoglobin A1c     Status: None   Collection Time: 05/15/17 12:19 PM  Result Value Ref Range   Hgb A1c MFr Bld 4.8 <5.7 % of total Hgb    Comment: For the purpose of screening for the presence of diabetes: . <5.7%       Consistent with the absence of diabetes 5.7-6.4%    Consistent with increased risk for diabetes             (prediabetes) > or =6.5%  Consistent with diabetes . This assay result is consistent with a decreased risk  of diabetes. . Currently, no consensus exists regarding use of hemoglobin A1c for diagnosis of diabetes in children. . According to American Diabetes Association (ADA) guidelines, hemoglobin A1c <7.0% represents optimal control in non-pregnant diabetic patients. Different metrics may apply to specific patient populations.  Standards of Medical Care in Diabetes(ADA). .    Mean Plasma Glucose 91 (calc)   eAG (mmol/L) 5.0 (calc)  Lipid panel     Status: None   Collection Time: 05/15/17 12:19 PM  Result Value Ref Range   Cholesterol 143 <200 mg/dL   HDL 51 >40 mg/dL   Triglycerides 66 <150 mg/dL   LDL Cholesterol (Calc) 77 mg/dL (calc)    Comment: Reference range: <100 . Desirable range <100 mg/dL for primary prevention;   <70 mg/dL for patients with CHD or diabetic patients  with > or = 2 CHD risk factors. Marland Kitchen LDL-C is now calculated using the Martin-Hopkins  calculation, which is a validated novel method providing  better accuracy than the Friedewald equation in the  estimation of LDL-C.  Cresenciano Genre et al. Annamaria Helling. 1093;235(57): 2061-2068  (http://education.[facility].com/faq/FAQ164)    Total CHOL/HDL Ratio 2.8 <5.0 (calc)   Non-HDL Cholesterol (Calc) 92 <130 mg/dL (calc)    Comment: For patients with diabetes plus 1 major ASCVD risk  factor, treating to a non-HDL-C goal of <100 mg/dL  (LDL-C of <70 mg/dL) is considered a therapeutic   option.   PSA     Status: None   Collection Time: 05/15/17 12:19 PM  Result Value Ref Range   PSA 0.5 < OR = 4.0 ng/mL    Comment: The total PSA value from this assay system is  standardized against the WHO standard. The test  result will be approximately 20% lower when compared  to the equimolar-standardized total PSA (Beckman  Coulter). Comparison of serial PSA results should be  interpreted with this fact in mind. . This test was performed using the Siemens  chemiluminescent method. Values obtained from  different assay methods cannot be used interchangeably. PSA levels, regardless of value, should not be interpreted as absolute evidence of the presence or absence of disease.   C-reactive protein     Status: None   Collection Time: 05/15/17 12:19 PM  Result Value Ref Range   CRP 0.8 <8.0 mg/L  Sedimentation rate     Status: None   Collection Time: 05/15/17 12:19 PM  Result Value Ref Range   Sed Rate 2 0 - 20 mm/h      PHQ2/9: Depression screen [facility] 2/9 05/15/2017 06/06/2016 02/05/2016  Decreased Interest 1 0 0  Down, Depressed, Hopeless 1 0 0  PHQ - 2 Score 2 0 0  Altered sleeping 1 - -  Tired, decreased energy 1 - -  Change in appetite 1 - -  Feeling bad or failure about yourself  1 - -  Trouble concentrating 1 - -  Moving slowly or fidgety/restless 1 - -  Suicidal thoughts 1 - -  PHQ-9 Score 9 - -  Difficult doing work/chores Somewhat difficult - -     Fall Risk: Fall Risk  05/15/2017 11/07/2016 06/06/2016 02/05/2016  Falls in the past year? Yes No No No  Number falls in past yr: 1 - - -  Injury with Fall? No - - -  Risk for fall due to : Impaired balance/gait;Impaired mobility;Impaired vision - - -  Risk for fall due to: Comment shuffling gait; wears eyeglasses - - -  Follow up Falls evaluation completed;Education provided;Falls prevention discussed - - -  Assessment & Plan  1. Annual physical exam  Discussed importance of 150 minutes of physical  activity weekly, eat two servings of fish weekly, eat one serving of tree nuts ( cashews, pistachios, pecans, almonds.Marland Kitchen) every other day, eat 6 servings of fruit/vegetables daily and drink plenty of water and avoid sweet beverages.   2. Cachexia ([place])  He lost another 3 lbs in the past week, reviewed labs with him today, explained he needs to increase caloric intake, explained it may also been from depression   3. PTSD (post-traumatic stress disorder)  Keep follow at [facility]   4. Thrombocytopenia ([place])  Recheck labs on his next visit    5. EKG abnormality  - Ambulatory referral to Cardiology Left bundle branch block - new onset on EKG - he will check to see if he can be seen at the [facility] instead

## 2017-06-02 ENCOUNTER — Telehealth: Payer: Self-pay | Admitting: *Deleted

## 2017-06-02 NOTE — Telephone Encounter (Signed)
Received referral for low dose lung cancer screening CT scan. Message left at phone number listed in EMR for patient to call me back to facilitate scheduling scan.  

## 2017-06-11 ENCOUNTER — Encounter: Payer: Self-pay | Admitting: *Deleted

## 2017-06-15 ENCOUNTER — Telehealth: Payer: Self-pay | Admitting: *Deleted

## 2017-06-15 DIAGNOSIS — Z87891 Personal history of nicotine dependence: Secondary | ICD-10-CM

## 2017-06-15 DIAGNOSIS — R634 Abnormal weight loss: Secondary | ICD-10-CM

## 2017-06-15 NOTE — Telephone Encounter (Signed)
Received referral for initial lung cancer screening scan. Contacted patient and obtained smoking history, which includes a quite date of > 15 years ago. Discussed current eligibility that requires smoking history within 15 years for lung cancer screening CT scan. Patient verbalizes understanding.

## 2017-06-15 NOTE — Addendum Note (Signed)
Addended by: Saunders Glance A on: 06/15/2017 03:41 PM   Modules accepted: Orders

## 2017-06-19 ENCOUNTER — Ambulatory Visit (INDEPENDENT_AMBULATORY_CARE_PROVIDER_SITE_OTHER): Payer: Medicare HMO | Admitting: Family Medicine

## 2017-06-19 ENCOUNTER — Encounter: Payer: Self-pay | Admitting: Family Medicine

## 2017-06-19 VITALS — BP 100/60 | HR 72 | Temp 98.3°F | Resp 16 | Ht 72.0 in | Wt 177.2 lb

## 2017-06-19 DIAGNOSIS — M2040 Other hammer toe(s) (acquired), unspecified foot: Secondary | ICD-10-CM | POA: Diagnosis not present

## 2017-06-19 DIAGNOSIS — D696 Thrombocytopenia, unspecified: Secondary | ICD-10-CM

## 2017-06-19 DIAGNOSIS — R64 Cachexia: Secondary | ICD-10-CM | POA: Diagnosis not present

## 2017-06-19 DIAGNOSIS — J3089 Other allergic rhinitis: Secondary | ICD-10-CM | POA: Diagnosis not present

## 2017-06-19 DIAGNOSIS — J302 Other seasonal allergic rhinitis: Secondary | ICD-10-CM | POA: Diagnosis not present

## 2017-06-19 MED ORDER — FLUTICASONE PROPIONATE 50 MCG/ACT NA SUSP: 2.0000 | Freq: Every day | NASAL | 1 refills | Status: DC

## 2017-06-19 NOTE — Progress Notes (Signed)
Name: Darren Thomas.   MRN: [id number]    DOB: 03-05-1946   Date:06/19/2017       Progress Note  Subjective  Chief Complaint  Chief Complaint  Patient presents with  . Follow-up    1 month F/U  . Weight Loss    Has been drinking Premier shakes daily and still the same weight    HPI  Weight loss: he has been gradually losing weight, over the past year he has lost another 10 lbs. He denies any cough, SOB or change in bowel movements. He has noticed dysphagia only when he swallows large pills. He thinks secondary to having difficulty looking up ( from neck problems). Not dysphagia for liquids or solids ( I will make sure he gets barium swallow done this time) . He has been more depressed and is seeing psychiatrist. Explained that it may be a factor on his weight loss. Labs and CXR normal, he will have CT chest done at the [place]. Taking protein shakes but no weight gain in one month, but has not lost any weight either.   Hammer toes: he states long history of hammer toes, but over the past few months, he is noticing corn formation and pain under 3rd toe.   Cervical myelopathy: he still has atrophy on hands, difficulty with his gait and has fallen once over the past year but states he tripped and does not want referral for PT.  He is seeing specialist through [facility]. He has daily pain but states medications have not helped him in the past He walks slowly   Patient Active Problem List   Diagnosis Date Noted  . Hammer toe, acquired 06/19/2017  . Chronic constipation 11/07/2016  . Insomnia 06/20/2016  . PTSD (post-traumatic stress disorder) 06/20/2016  . Perennial allergic rhinitis with seasonal variation 02/05/2016  . Cervical spondylosis with myelopathy 01/06/2014    Past Surgical History:  Procedure Laterality Date  . C-spine     C-3-7 removed  . SCALP LESION REMOVAL W/ FLAP AND SKIN GRAFT      Family History  Problem Relation Age of Onset  . Cervical cancer Mother   . Heart  Problems Father   . Asthma Brother   . Emphysema Brother        was a smoker    Social History   Socioeconomic History  . Marital status: Married    Spouse name: Derl Barrow  . Number of children: 2  . 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 Highest education level: 8th grade  Social Needs  . Financial resource strain: Not hard at all  . Food insecurity - worry: Never true  . Food insecurity - inability: Never true  . Transportation needs - medical: No  . Transportation needs - non-medical: No  Occupational History  . Occupation: retired  Tobacco Use  . Smoking status: Former Smoker    Packs/day: 1.00    Years: 20.00    Pack years: 20.00    Types: Cigarettes    Last attempt to quit: 1973    Years since quitting: 46.1  . Smokeless tobacco: Never Used  . Tobacco comment: smoking cessation materials not required  Substance and Sexual Activity  . Alcohol use: No  . Drug use: No  . Sexual activity: Not Currently  Other Topics Concern  . Not on file  Social History Narrative  . Not on file     Current Outpatient Medications:  .  buPROPion (WELLBUTRIN XL) 300 MG  24 hr tablet, Take 300 mg by mouth daily., Disp: , Rfl:  .  Cyanocobalamin (B-12) 1000 MCG SUBL, Place 1 tablet under the tongue daily., Disp: 30 each, Rfl: 0 .  escitalopram (LEXAPRO) 20 MG tablet, Take 20 mg by mouth daily., Disp: , Rfl: 2 .  fluticasone (FLONASE) 50 MCG/ACT nasal spray, USE 2 SPRAYS IN EACH NOSTRIL EVERY DAY, Disp: 48 g, Rfl: 1 .  loratadine (CLARITIN) 10 MG tablet, Take 1 tablet (10 mg total) by mouth daily., Disp: 90 tablet, Rfl: 3 .  Multiple Vitamins-Minerals (COMPLETE MULTIVITAMIN/MINERAL PO), Take 1 tablet by mouth daily., Disp: , Rfl:  .  naproxen sodium (ANAPROX) 220 MG tablet, Take 220 mg by mouth 2 (two) times daily with a meal., Disp: , Rfl:  .  Omega-3 Fatty Acids (FISH OIL) 1200 MG CAPS, Take 3 capsules by mouth daily., Disp: , Rfl:  .  polyethylene glycol (MIRALAX / GLYCOLAX)  packet, Take 17 g by mouth every other day., Disp: , Rfl:  .  prazosin (MINIPRESS) 1 MG capsule, Take 5 mg by mouth at bedtime. Take 3 capsules at bedtime, Disp: , Rfl:  .  traZODone (DESYREL) 50 MG tablet, Take 50-100 mg by mouth at bedtime as needed. for sleep, Disp: , Rfl: 0  Allergies  Allergen Reactions  . Augmentin [Amoxicillin-Pot Clavulanate] Diarrhea     ROS  Constitutional: Negative for fever ,weight stable over the past month Respiratory: Negative for cough and shortness of breath.   Cardiovascular: Negative for chest pain or palpitations.  Gastrointestinal: Negative for abdominal pain, no bowel changes.  Musculoskeletal: Postive for gait problem - from cervical myelopathy  or joint swelling.  Skin: Negative for rash.  Neurological: Negative for dizziness or headache.  No other specific complaints in a complete review of systems (except as listed in HPI above).    Objective  Vitals:   06/19/17 1055  BP: 100/60  Pulse: 72  Resp: 16  Temp: 98.3 F (36.8 C)  TempSrc: Oral  SpO2: 99%  Weight: 177 lb 3.2 oz (80.4 kg)  Height: 6' (1.829 m)    Body mass index is 24.03 kg/m.  Physical Exam  Constitutional: Patient appears malnourished, but no distress.  HEENT: head atraumatic, normocephalic, pupils equal and reactive to light, right lazy eye, no thyromegaly, throat within normal limits. Strabismus.  Cardiovascular: Normal rate, regular rhythm and normal heart sounds.  No murmur heard. No BLE edema. Pulmonary/Chest: Effort normal and breath sounds normal. No respiratory distress. Abdominal: Soft.  There is no tenderness. Psychiatric: Patient has a normal mood and affect. behavior is normal. Judgment and thought content normal. Skin: hammer toe on 2nd, 3rd, 4th toes right foot, corn formation, and tender/loose 3rd  toe nail Muscular Skeletal: slow gait, decrease rom of neck, atrophy both hands    Recent Results (from the past 2160 hour(s))  TSH     Status:  None   Collection Time: 05/15/17 12:19 PM  Result Value Ref Range   TSH 1.01 0.40 - 4.50 mIU/L  CBC with Differential/Platelet     Status: Abnormal   Collection Time: 05/15/17 12:19 PM  Result Value Ref Range   WBC 4.5 3.8 - 10.8 Thousand/uL   RBC 4.53 4.20 - 5.80 Million/uL   Hemoglobin 13.5 13.2 - 17.1 g/dL   HCT 39.8 38.5 - 50.0 %   MCV 87.9 80.0 - 100.0 fL   MCH 29.8 27.0 - 33.0 pg   MCHC 33.9 32.0 - 36.0 g/dL   RDW 13.2 11.0 -  15.0 %   Platelets 136 (L) 140 - 400 Thousand/uL   MPV 11.8 7.5 - 12.5 fL   Neutro Abs 3,299 1,500 - 7,800 cells/uL   Lymphs Abs 833 (L) 850 - 3,900 cells/uL   WBC mixed population 306 200 - 950 cells/uL   Eosinophils Absolute 32 15 - 500 cells/uL   Basophils Absolute 32 0 - 200 cells/uL   Neutrophils Relative % 73.3 %   Total Lymphocyte 18.5 %   Monocytes Relative 6.8 %   Eosinophils Relative 0.7 %   Basophils Relative 0.7 %  COMPLETE METABOLIC PANEL WITH GFR     Status: None   Collection Time: 05/15/17 12:19 PM  Result Value Ref Range   Glucose, Bld 73 65 - 99 mg/dL    Comment: .            Fasting reference interval .    BUN 21 7 - 25 mg/dL   Creat 1.08 0.70 - 1.18 mg/dL    Comment: For patients >40 years of age, the reference limit for Creatinine is approximately 13% higher for people identified as African-American. .    GFR, Est Non African American 68 > OR = 60 mL/min/1.10m2   GFR, Est African American 78 > OR = 60 mL/min/1.43m2   BUN/Creatinine Ratio NOT APPLICABLE 6 - 22 (calc)   Sodium 142 135 - 146 mmol/L   Potassium 4.7 3.5 - 5.3 mmol/L   Chloride 105 98 - 110 mmol/L   CO2 32 20 - 32 mmol/L   Calcium 9.0 8.6 - 10.3 mg/dL   Total Protein 6.3 6.1 - 8.1 g/dL   Albumin 3.9 3.6 - 5.1 g/dL   Globulin 2.4 1.9 - 3.7 g/dL (calc)   AG Ratio 1.6 1.0 - 2.5 (calc)   Total Bilirubin 0.6 0.2 - 1.2 mg/dL   Alkaline phosphatase (APISO) 63 40 - 115 U/L   AST 14 10 - 35 U/L   ALT 9 9 - 46 U/L  HIV antibody     Status: None   Collection  Time: 05/15/17 12:19 PM  Result Value Ref Range   HIV 1&2 Ab, 4th Generation NON-REACTIVE NON-REACTI    Comment: HIV-1 antigen and HIV-1/HIV-2 antibodies were not detected. There is no laboratory evidence of HIV infection. Marland Kitchen PLEASE NOTE: This information has been disclosed to you from records whose confidentiality may be protected by state law.  If your state requires such protection, then the state law prohibits you from making any further disclosure of the information without the specific written consent of the person to whom it pertains, or as otherwise permitted by law. A general authorization for the release of medical or other information is NOT sufficient for this purpose. . For additional information please refer to http://education.[facility].com/faq/FAQ106 (This link is being provided for informational/ educational purposes only.) . Marland Kitchen The performance of this assay has not been clinically validated in patients less than 55 years old. .   Hemoglobin A1c     Status: None   Collection Time: 05/15/17 12:19 PM  Result Value Ref Range   Hgb A1c MFr Bld 4.8 <5.7 % of total Hgb    Comment: For the purpose of screening for the presence of diabetes: . <5.7%       Consistent with the absence of diabetes 5.7-6.4%    Consistent with increased risk for diabetes             (prediabetes) > or =6.5%  Consistent with diabetes . This assay result is  consistent with a decreased risk of diabetes. . Currently, no consensus exists regarding use of hemoglobin A1c for diagnosis of diabetes in children. . According to American Diabetes Association (ADA) guidelines, hemoglobin A1c <7.0% represents optimal control in non-pregnant diabetic patients. Different metrics may apply to specific patient populations.  Standards of Medical Care in Diabetes(ADA). .    Mean Plasma Glucose 91 (calc)   eAG (mmol/L) 5.0 (calc)  Lipid panel     Status: None   Collection Time: 05/15/17 12:19  PM  Result Value Ref Range   Cholesterol 143 <200 mg/dL   HDL 51 >40 mg/dL   Triglycerides 66 <150 mg/dL   LDL Cholesterol (Calc) 77 mg/dL (calc)    Comment: Reference range: <100 . Desirable range <100 mg/dL for primary prevention;   <70 mg/dL for patients with CHD or diabetic patients  with > or = 2 CHD risk factors. Marland Kitchen LDL-C is now calculated using the Martin-Hopkins  calculation, which is a validated novel method providing  better accuracy than the Friedewald equation in the  estimation of LDL-C.  Cresenciano Genre et al. Annamaria Helling. 4235;361(44): 2061-2068  (http://education.[facility].com/faq/FAQ164)    Total CHOL/HDL Ratio 2.8 <5.0 (calc)   Non-HDL Cholesterol (Calc) 92 <130 mg/dL (calc)    Comment: For patients with diabetes plus 1 major ASCVD risk  factor, treating to a non-HDL-C goal of <100 mg/dL  (LDL-C of <70 mg/dL) is considered a therapeutic  option.   PSA     Status: None   Collection Time: 05/15/17 12:19 PM  Result Value Ref Range   PSA 0.5 < OR = 4.0 ng/mL    Comment: The total PSA value from this assay system is  standardized against the WHO standard. The test  result will be approximately 20% lower when compared  to the equimolar-standardized total PSA (Beckman  Coulter). Comparison of serial PSA results should be  interpreted with this fact in mind. . This test was performed using the Siemens  chemiluminescent method. Values obtained from  different assay methods cannot be used interchangeably. PSA levels, regardless of value, should not be interpreted as absolute evidence of the presence or absence of disease.   C-reactive protein     Status: None   Collection Time: 05/15/17 12:19 PM  Result Value Ref Range   CRP 0.8 <8.0 mg/L  Sedimentation rate     Status: None   Collection Time: 05/15/17 12:19 PM  Result Value Ref Range   Sed Rate 2 0 - 20 mm/h      PHQ2/9: Depression screen [facility] 2/9 05/15/2017 06/06/2016 02/05/2016  Decreased Interest 1 0 0   Down, Depressed, Hopeless 1 0 0  PHQ - 2 Score 2 0 0  Altered sleeping 1 - -  Tired, decreased energy 1 - -  Change in appetite 1 - -  Feeling bad or failure about yourself  1 - -  Trouble concentrating 1 - -  Moving slowly or fidgety/restless 1 - -  Suicidal thoughts 1 - -  PHQ-9 Score 9 - -  Difficult doing work/chores Somewhat difficult - -     Fall Risk: Fall Risk  05/15/2017 11/07/2016 06/06/2016 02/05/2016  Falls in the past year? Yes No No No  Number falls in past yr: 1 - - -  Injury with Fall? No - - -  Risk for fall due to : Impaired balance/gait;Impaired mobility;Impaired vision - - -  Risk for fall due to: Comment shuffling gait; wears eyeglasses - - -  Follow up  Falls evaluation completed;Education provided;Falls prevention discussed - - -     Assessment & Plan  1. Cachexia ([place])  CXR showed granuloma, he states he will try to get CT chest done at the [place] since he did not qualify for the low dose CT scan   2. Thrombocytopenia ([place])  Recheck CBC  3. Perennial allergic rhinitis with seasonal variation  - fluticasone (FLONASE) 50 MCG/ACT nasal spray; Place 2 sprays into both nostrils daily.  Dispense: 48 g; Refill: 1  4. Hammer toe, acquired  Causing pain, loose nail and tender on 3rd toe he states he will see podiatrist at the [facility], may need debridement follow by surgery

## 2017-06-19 NOTE — Patient Instructions (Addendum)
Weight loss, discussed Remeron with patient during his visit, he will discuss it with his psychiatrist

## 2017-07-09 ENCOUNTER — Ambulatory Visit: Payer: Medicare HMO | Admitting: Cardiovascular Disease

## 2017-08-17 ENCOUNTER — Ambulatory Visit (INDEPENDENT_AMBULATORY_CARE_PROVIDER_SITE_OTHER): Payer: Medicare HMO | Admitting: Family Medicine

## 2017-08-17 ENCOUNTER — Encounter: Payer: Self-pay | Admitting: Family Medicine

## 2017-08-17 VITALS — BP 120/70 | HR 79 | Resp 16 | Ht 72.0 in | Wt 183.0 lb

## 2017-08-17 DIAGNOSIS — F330 Major depressive disorder, recurrent, mild: Secondary | ICD-10-CM

## 2017-08-17 DIAGNOSIS — J3089 Other allergic rhinitis: Secondary | ICD-10-CM

## 2017-08-17 DIAGNOSIS — J302 Other seasonal allergic rhinitis: Secondary | ICD-10-CM

## 2017-08-17 DIAGNOSIS — D692 Other nonthrombocytopenic purpura: Secondary | ICD-10-CM | POA: Diagnosis not present

## 2017-08-17 DIAGNOSIS — R64 Cachexia: Secondary | ICD-10-CM

## 2017-08-17 DIAGNOSIS — D696 Thrombocytopenia, unspecified: Secondary | ICD-10-CM | POA: Diagnosis not present

## 2017-08-17 LAB — CBC
HCT: 39.6 % (ref 38.5–50.0)
HEMOGLOBIN: 13.8 g/dL (ref 13.2–17.1)
MCH: 30.3 pg (ref 27.0–33.0)
MCHC: 34.8 g/dL (ref 32.0–36.0)
MCV: 87 fL (ref 80.0–100.0)
MPV: 11.4 fL (ref 7.5–12.5)
PLATELETS: 179 10*3/uL (ref 140–400)
RBC: 4.55 10*6/uL (ref 4.20–5.80)
RDW: 13.8 % (ref 11.0–15.0)
WBC: 5.7 10*3/uL (ref 3.8–10.8)

## 2017-08-17 NOTE — Progress Notes (Signed)
Name: Darren Thomas.   MRN: [id number]    DOB: April 11, 1946   Date:08/17/2017       Progress Note  Subjective  Chief Complaint  Chief Complaint  Patient presents with  . Weight Check  . Post-Traumatic Stress Disorder    HPI  Cachexia: he gained some weight since last visit, he states he is taking protein supplementation   Depression Major: seeing psychiatrist, now on lamotrigine twice daily, still has PTSD, he denies crying spells or suicidal thoughts. Compliant with medication. He states memory is not as good/lack of focus  Senile purpura: stable.   Perennial allergic rhinitis: he states still has symptoms, post-nasal drainage, he states nasal spray and loratadine, seems to help  Thrombocytopenia: we will recheck labs, he has senile purpura   Patient Active Problem List   Diagnosis Date Noted  . Hammer toe, acquired 06/19/2017  . Chronic constipation 11/07/2016  . Insomnia 06/20/2016  . PTSD (post-traumatic stress disorder) 06/20/2016  . Perennial allergic rhinitis with seasonal variation 02/05/2016  . Cervical spondylosis with myelopathy 01/06/2014    Past Surgical History:  Procedure Laterality Date  . C-spine     C-3-7 removed  . SCALP LESION REMOVAL W/ FLAP AND SKIN GRAFT      Family History  Problem Relation Age of Onset  . Cervical cancer Mother   . Heart Problems Father   . Asthma Brother   . Emphysema Brother        was a smoker    Social History   Socioeconomic History  . Marital status: Married    Spouse name: Derl Barrow  . Number of children: 2  . 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 Highest education level: 8th grade  Occupational History  . Occupation: retired  Scientific laboratory technician  . Financial resource strain: Not hard at all  . Food insecurity:    Worry: Never true    Inability: Never true  . Transportation needs:    Medical: No    Non-medical: No  Tobacco Use  . Smoking status: Former Smoker    Packs/day: 1.00    Years: 20.00    Pack  years: 20.00    Types: Cigarettes    Last attempt to quit: 1973    Years since quitting: 46.2  . Smokeless tobacco: Never Used  . Tobacco comment: smoking cessation materials not required  Substance and Sexual Activity  . Alcohol use: No  . Drug use: No  . Sexual activity: Not Currently  Lifestyle  . Physical activity:    Days per week: 5 days    Minutes per session: 60 min  . Stress: Only a little  Relationships  . Social connections:    Talks on phone: Once a week    Gets together: Once a week    Attends religious service: Never    Active member of club or organization: No    Attends meetings of clubs or organizations: Never    Relationship status: Married  . Intimate partner violence:    Fear of current or ex partner: No    Emotionally abused: No    Physically abused: No    Forced sexual activity: No  Other Topics Concern  . Not on file  Social History Narrative  . Not on file     Current Outpatient Medications:  .  buPROPion (WELLBUTRIN XL) 300 MG 24 hr tablet, Take 300 mg by mouth daily., Disp: , Rfl:  .  Cyanocobalamin (B-12) 1000 MCG SUBL,  Place 1 tablet under the tongue daily., Disp: 30 each, Rfl: 0 .  escitalopram (LEXAPRO) 20 MG tablet, Take 20 mg by mouth daily., Disp: , Rfl: 2 .  fluticasone (FLONASE) 50 MCG/ACT nasal spray, Place 2 sprays into both nostrils daily., Disp: 48 g, Rfl: 1 .  loratadine (CLARITIN) 10 MG tablet, Take 1 tablet (10 mg total) by mouth daily., Disp: 90 tablet, Rfl: 3 .  Multiple Vitamins-Minerals (COMPLETE MULTIVITAMIN/MINERAL PO), Take 1 tablet by mouth daily., Disp: , Rfl:  .  naproxen sodium (ANAPROX) 220 MG tablet, Take 220 mg by mouth 2 (two) times daily with a meal., Disp: , Rfl:  .  Omega-3 Fatty Acids (FISH OIL) 1200 MG CAPS, Take 3 capsules by mouth daily., Disp: , Rfl:  .  polyethylene glycol (MIRALAX / GLYCOLAX) packet, Take 17 g by mouth every other day., Disp: , Rfl:  .  traZODone (DESYREL) 50 MG tablet, Take 50-100 mg by  mouth at bedtime as needed. for sleep, Disp: , Rfl: 0 .  lamoTRIgine (LAMICTAL) 25 MG tablet, Take 1 tablet by mouth 2 (two) times daily., Disp: , Rfl: 0  Allergies  Allergen Reactions  . Augmentin [Amoxicillin-Pot Clavulanate] Diarrhea     ROS  Constitutional: Negative for fever , positive for mild  weight change.  Respiratory: Positive for post-nasal drainage and  Cough, denies  shortness of breath.   Cardiovascular: Negative for chest pain or palpitations.  Gastrointestinal: Negative for abdominal pain, no bowel changes.  Musculoskeletal: Negative for gait problem or joint swelling.  Skin: Negative for rash.  Neurological: Negative for dizziness or headache.  No other specific complaints in a complete review of systems (except as listed in HPI above).  Objective  Vitals:   08/17/17 1128  BP: 120/70  Pulse: 79  Resp: 16  SpO2: 98%  Weight: 183 lb (83 kg)  Height: 6' (1.829 m)    Body mass index is 24.82 kg/m.  Physical Exam  Constitutional: Patient appears well-developed and very thin, has muscle atrophy on arms, temporal wasting, No distress.  HEENT: head atraumatic, normocephalic, pupils equal and reactive to light, neck has decrease extension, throat within normal limits Cardiovascular: Normal rate, regular rhythm and normal heart sounds.  No murmur heard. No BLE edema. Pulmonary/Chest: Effort normal and breath sounds normal. No respiratory distress. Abdominal: Soft.  There is no tenderness. Psychiatric: Patient has a normal mood and affect. behavior is normal. Judgment and thought content normal.  PHQ2/9: Depression screen [facility] 2/9 05/15/2017 06/06/2016 02/05/2016  Decreased Interest 1 0 0  Down, Depressed, Hopeless 1 0 0  PHQ - 2 Score 2 0 0  Altered sleeping 1 - -  Tired, decreased energy 1 - -  Change in appetite 1 - -  Feeling bad or failure about yourself  1 - -  Trouble concentrating 1 - -  Moving slowly or fidgety/restless 1 - -  Suicidal thoughts 1 - -   PHQ-9 Score 9 - -  Difficult doing work/chores Somewhat difficult - -     Fall Risk: Fall Risk  08/17/2017 05/15/2017 11/07/2016 06/06/2016 02/05/2016  Falls in the past year? No Yes No No No  Number falls in past yr: - 1 - - -  Injury with Fall? - No - - -  Risk for fall due to : - Impaired balance/gait;Impaired mobility;Impaired vision - - -  Risk for fall due to: Comment - shuffling gait; wears eyeglasses - - -  Follow up - Falls evaluation completed;Education provided;Falls prevention  discussed - - -      Functional Status Survey: Is the patient deaf or have difficulty hearing?: No Does the patient have difficulty seeing, even when wearing glasses/contacts?: No Does the patient have difficulty concentrating, remembering, or making decisions?: No Does the patient have difficulty walking or climbing stairs?: Yes Does the patient have difficulty dressing or bathing?: No Does the patient have difficulty doing errands alone such as visiting a doctor's office or shopping?: No  Assessment & Plan  1. Cachexia ([place])  Taking protein supplementation, gaining a little weight now  2. Senile purpura ([facility])  stable  3. Perennial allergic rhinitis with seasonal variation  Still has symptoms, using nasal spray .   4. Thrombocytopenia ([facility])  - CBC  5. Mild episode of recurrent major depressive disorder [facility])  Seeing psychiatrist.

## 2017-10-06 ENCOUNTER — Ambulatory Visit (INDEPENDENT_AMBULATORY_CARE_PROVIDER_SITE_OTHER): Payer: Medicare HMO | Admitting: Family Medicine

## 2017-10-06 ENCOUNTER — Encounter: Payer: Self-pay | Admitting: Family Medicine

## 2017-10-06 VITALS — BP 102/58 | HR 70 | Temp 98.0°F | Resp 16 | Ht 72.0 in | Wt 181.3 lb

## 2017-10-06 DIAGNOSIS — R1311 Dysphagia, oral phase: Secondary | ICD-10-CM | POA: Diagnosis not present

## 2017-10-06 DIAGNOSIS — D692 Other nonthrombocytopenic purpura: Secondary | ICD-10-CM

## 2017-10-06 DIAGNOSIS — L97521 Non-pressure chronic ulcer of other part of left foot limited to breakdown of skin: Secondary | ICD-10-CM

## 2017-10-06 DIAGNOSIS — L97511 Non-pressure chronic ulcer of other part of right foot limited to breakdown of skin: Secondary | ICD-10-CM

## 2017-10-06 DIAGNOSIS — F339 Major depressive disorder, recurrent, unspecified: Secondary | ICD-10-CM

## 2017-10-06 DIAGNOSIS — R64 Cachexia: Secondary | ICD-10-CM | POA: Diagnosis not present

## 2017-10-06 DIAGNOSIS — E46 Unspecified protein-calorie malnutrition: Secondary | ICD-10-CM | POA: Insufficient documentation

## 2017-10-06 NOTE — Patient Instructions (Signed)
High-Protein and High-Calorie Diet Eating high-protein and high-calorie foods can help you to gain weight, heal after an injury, and recover after an illness or surgery. What is my plan? The specific amount of daily protein and calories you need depends on:  Your body weight.  The reason this diet is recommended for you.  Generally, a high-protein, high-calorie diet involves:  Eating 250-500 extra calories each day.  Making sure that 10-35% of your daily calories come from protein.  Talk to your health care provider about how much protein and how many calories you need each day. Follow the diet as directed by your health care provider. What do I need to know about this diet?  Ask your health care provider if you should take a nutritional supplement.  Try to eat six small meals each day instead of three large meals.  Eat a balanced diet, including one food that is high in protein at each meal.  Keep nutritious snacks handy, such as nuts, trail mixes, dried fruit, and yogurt.  If you have kidney disease or diabetes, eating too much protein may put extra stress on your kidneys. Talk to your health care provider if you have either of those conditions. What are some high-protein foods? Grains Quinoa. Bulgur wheat. Vegetables Soybeans. Peas. Meats and Other Protein Sources Beef, pork, and poultry. Fish and seafood. Eggs. Tofu. Textured vegetable protein (TVP). Peanut butter. Nuts and seeds. Dried beans. Protein powders. Dairy Whole milk. Whole-milk yogurt. Powdered milk. Cheese. Yahoo. Eggnog. Beverages High-protein supplement drinks. Soy milk. Other Protein bars. The items listed above may not be a complete list of recommended foods or beverages. Contact your dietitian for more options. What are some high-calorie foods? Grains Pasta. Quick breads. Muffins. Pancakes. Ready-to-eat cereal. Vegetables Vegetables cooked in oil or butter. Fried potatoes. Fruits Dried  fruit. Fruit leather. Canned fruit in syrup. Fruit juice. Avocados. Meats and Other Protein Sources Peanut butter. Nuts and seeds. Dairy Heavy cream. Whipped cream. Cream cheese. Sour cream. Ice cream. Custard. Pudding. Beverages Meal-replacement beverages. Nutrition shakes. Fruit juice. Sugar-sweetened soft drinks. Condiments Salad dressing. Mayonnaise. Alfredo sauce. Fruit preserves or jelly. Honey. Syrup. Sweets/Desserts Cake. Cookies. Pie. Pastries. Candy bars. Chocolate. Fats and Oils Butter or margarine. Oil. Gravy. Other Meal-replacement bars. The items listed above may not be a complete list of recommended foods or beverages. Contact your dietitian for more options. What are some tips for including high-protein and high-calorie foods in my diet?  Add whole milk, half-and-half, or heavy cream to cereal, pudding, soup, or hot cocoa.  Add whole milk to instant breakfast drinks.  Add peanut butter to oatmeal or smoothies.  Add powdered milk to baked goods, smoothies, or milkshakes.  Add powdered milk, cream, or butter to mashed potatoes.  Add cheese to cooked vegetables.  Make whole-milk yogurt parfaits. Top them with granola, fruit, or nuts.  Add cottage cheese to your fruit.  Add avocados, cheese, or both to sandwiches or salads.  Add meat, poultry, or seafood to rice, pasta, casseroles, salads, and soups.  Use mayonnaise when making egg salad, chicken salad, or tuna salad.  Use peanut butter as a topping for pretzels, celery, or crackers.  Add beans to casseroles, dips, and spreads.  Add pureed beans to sauces and soups.  Replace calorie-free drinks with calorie-containing drinks, such as milk and fruit juice. This information is not intended to replace advice given to you by your health care provider. Make sure you discuss any questions you have with your health  care provider. Document Released: 04/28/2005 Document Revised: 10/04/2015 Document Reviewed:  10/11/2013 Elsevier Interactive Patient Education  [employer].

## 2017-10-06 NOTE — Progress Notes (Signed)
Name: Darren Thomas.   MRN: [id number]    DOB: [birth year]   Date:10/06/2017       Progress Note  Subjective  Chief Complaint  Chief Complaint  Patient presents with  . Toe Pain    [facility] and they referred him to a Podiatrist that cut off his 2 foot callus on both feet 3rd toes. His right toe has been hurting him since then. Thinks it is infected      HPI  Chronic foot ulcer: he does not have DM or PVD, he has hammer toes, and ulceration started months ago, he states has to use apparatus to keep toe off the floor otherwise it causes a lot of pain, no oozing at this time, no fever or chills. Left side is healing but right is not getting better. He is noticing some redness. He would like to see someone locally and is waiting to have [place] approval, advised to go within the next couple of weeks.   Major Depression/PTSD: still not in remission, he goes to psychiatrist at the [facility] and is compliant with follow up, denies suicidal thoughts or ideation  Senile purpura: stable on both arms  Cachexia: trying to gain weight. He is trying to eat larger portions, drinking protein shakes but still lost 2 lbs since last visit. He  has been gradually losing weight, over the past 18 months he has lost another 12 lbs. He denies any cough, SOB or change in bowel movements. He has noticed dysphagia only when he swallows large pills. He thinks secondary to having difficulty looking up ( from neck problems). Not dysphagia for liquids or solids . He has been more depressed and is seeing psychiatrist. Explained that it may be a factor on his weight loss. Labs and CXR normal, he will have CT chest done at the [place]. Taking protein shakes but still losing weight.    Patient Active Problem List   Diagnosis Date Noted  . Major depression, recurrent, chronic ([place]) 10/06/2017  . Malnutrition ([place]) 10/06/2017  . Hammer toe, acquired 06/19/2017  . Chronic constipation 11/07/2016  .  Insomnia 06/20/2016  . PTSD (post-traumatic stress disorder) 06/20/2016  . Perennial allergic rhinitis with seasonal variation 02/05/2016  . Cervical spondylosis with myelopathy 01/06/2014    Past Surgical History:  Procedure Laterality Date  . C-spine     C-3-7 removed  . SCALP LESION REMOVAL W/ FLAP AND SKIN GRAFT      Family History  Problem Relation Age of Onset  . Cervical cancer Mother   . Heart Problems Father   . Asthma Brother   . Emphysema Brother        was a smoker    Social History   Socioeconomic History  . Marital status: Married    Spouse name: Derl Barrow  . Number of children: 2  . 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 Highest education level: 8th grade  Occupational History  . Occupation: retired  Scientific laboratory technician  . Financial resource strain: Not hard at all  . Food insecurity:    Worry: Never true    Inability: Never true  . Transportation needs:    Medical: No    Non-medical: No  Tobacco Use  . Smoking status: Former Smoker    Packs/day: 1.00    Years: 20.00    Pack years: 20.00    Types: Cigarettes    Last attempt to quit: 1973  Years since quitting: 46.4  . Smokeless tobacco: Never Used  . Tobacco comment: smoking cessation materials not required  Substance and Sexual Activity  . Alcohol use: No  . Drug use: No  . Sexual activity: Not Currently  Lifestyle  . Physical activity:    Days per week: 5 days    Minutes per session: 60 min  . Stress: Only a little  Relationships  . Social connections:    Talks on phone: Once a week    Gets together: Once a week    Attends religious service: Never    Active member of club or organization: No    Attends meetings of clubs or organizations: Never    Relationship status: Married  . Intimate partner violence:    Fear of current or ex partner: No    Emotionally abused: No    Physically abused: No    Forced sexual activity: No  Other Topics Concern  . Not on file  Social History  Narrative  . Not on file     Current Outpatient Medications:  .  buPROPion (WELLBUTRIN XL) 300 MG 24 hr tablet, Take 300 mg by mouth daily., Disp: , Rfl:  .  Cyanocobalamin (B-12) 1000 MCG SUBL, Place 1 tablet under the tongue daily., Disp: 30 each, Rfl: 0 .  escitalopram (LEXAPRO) 20 MG tablet, Take 20 mg by mouth daily., Disp: , Rfl: 2 .  fluticasone (FLONASE) 50 MCG/ACT nasal spray, Place 2 sprays into both nostrils daily., Disp: 48 g, Rfl: 1 .  lamoTRIgine (LAMICTAL) 25 MG tablet, Take 1 tablet by mouth 2 (two) times daily., Disp: , Rfl: 0 .  loratadine (CLARITIN) 10 MG tablet, Take 1 tablet (10 mg total) by mouth daily., Disp: 90 tablet, Rfl: 3 .  Multiple Vitamins-Minerals (COMPLETE MULTIVITAMIN/MINERAL PO), Take 1 tablet by mouth daily., Disp: , Rfl:  .  naproxen sodium (ANAPROX) 220 MG tablet, Take 220 mg by mouth 2 (two) times daily with a meal., Disp: , Rfl:  .  Omega-3 Fatty Acids (FISH OIL) 1200 MG CAPS, Take 3 capsules by mouth daily., Disp: , Rfl:  .  polyethylene glycol (MIRALAX / GLYCOLAX) packet, Take 17 g by mouth every other day., Disp: , Rfl:  .  traZODone (DESYREL) 50 MG tablet, Take 50-100 mg by mouth at bedtime as needed. for sleep, Disp: , Rfl: 0  Allergies  Allergen Reactions  . Augmentin [Amoxicillin-Pot Clavulanate] Diarrhea     ROS  Constitutional: Negative for fever or significant  weight change.  Respiratory: Negative for cough and shortness of breath.   Cardiovascular: Negative for chest pain or palpitations.  Gastrointestinal: Negative for abdominal pain, no bowel changes.  Musculoskeletal: positive for gait problem but no joint swelling.  Skin: Negative for rash.  Neurological: Negative for dizziness or headache.  No other specific complaints in a complete review of systems (except as listed in HPI above).  Objective  Vitals:   10/06/17 0918  BP: (!) 102/58  Pulse: 70  Resp: 16  Temp: 98 F (36.7 C)  TempSrc: Oral  SpO2: 96%  Weight: 181  lb 4.8 oz (82.2 kg)  Height: 6' (1.829 m)    Body mass index is 24.59 kg/m.  Physical Exam  Constitutional: Patient appears  frail.  No distress.  HEENT: head atraumatic, normocephalic, pupils equal and reactive to light,neck supple, throat within normal limits Cardiovascular: Normal rate, regular rhythm and normal heart sounds.  No murmur heard. No BLE edema. Pulmonary/Chest: Effort normal and breath sounds  normal. No respiratory distress. Muscular skeletal: neck has decrease rom, difficulty looking up, also has hammer toes on both feet, normal distal pulses, ulceration worse on distal aspect of right third toe and also on distal part of 3rd left toe. No oozing. Ulceration is larger on right side Abdominal: Soft.  There is no tenderness. Psychiatric: Patient has a normal mood and affect. behavior is normal. Judgment and thought content normal.  Recent Results (from the past 2160 hour(s))  CBC     Status: None   Collection Time: 08/17/17 12:36 PM  Result Value Ref Range   WBC 5.7 3.8 - 10.8 Thousand/uL   RBC 4.55 4.20 - 5.80 Million/uL   Hemoglobin 13.8 13.2 - 17.1 g/dL   HCT 39.6 38.5 - 50.0 %   MCV 87.0 80.0 - 100.0 fL   MCH 30.3 27.0 - 33.0 pg   MCHC 34.8 32.0 - 36.0 g/dL   RDW 13.8 11.0 - 15.0 %   Platelets 179 140 - 400 Thousand/uL   MPV 11.4 7.5 - 12.5 fL    PHQ2/9: Depression screen [facility] 2/9 10/06/2017 05/15/2017 06/06/2016 02/05/2016  Decreased Interest 1 1 0 0  Down, Depressed, Hopeless 2 1 0 0  PHQ - 2 Score 3 2 0 0  Altered sleeping 1 1 - -  Tired, decreased energy 1 1 - -  Change in appetite 0 1 - -  Feeling bad or failure about yourself  0 1 - -  Trouble concentrating 0 1 - -  Moving slowly or fidgety/restless 1 1 - -  Suicidal thoughts 1 1 - -  PHQ-9 Score 7 9 - -  Difficult doing work/chores Somewhat difficult Somewhat difficult - -     Fall Risk: Fall Risk  10/06/2017 08/17/2017 05/15/2017 11/07/2016 06/06/2016  Falls in the past year? No No Yes No No  Number  falls in past yr: - - 1 - -  Injury with Fall? - - No - -  Risk for fall due to : - - Impaired balance/gait;Impaired mobility;Impaired vision - -  Risk for fall due to: Comment - - shuffling gait; wears eyeglasses - -  Follow up - - Falls evaluation completed;Education provided;Falls prevention discussed - -    Functional Status Survey: Is the patient deaf or have difficulty hearing?: Yes(wears hearing aids ) Does the patient have difficulty seeing, even when wearing glasses/contacts?: No Does the patient have difficulty concentrating, remembering, or making decisions?: Yes Does the patient have difficulty walking or climbing stairs?: Yes Does the patient have difficulty dressing or bathing?: No Does the patient have difficulty doing errands alone such as visiting a doctor's office or shopping?: No    Assessment & Plan  1. Cachexia ([place])  Discussed protein shakes   2. Senile purpura ([facility])  On both arms   3. Chronic ulcer of toe of right foot, limited to breakdown of skin ([place])  Currently seeing podiatrist at the [facility] but applying for choice program and is trying to see someone locally, he continues to have pain, had debridement done at the [place] about one month ago .   4. Chronic ulcer of left foot limited to breakdown of skin ([facility])  Better than right side   5. Major depression, recurrent, chronic ([place])  Keep follow up with psychiatrist.   6. Oral phase dysphagia  - DG Esophagus; Future

## 2017-10-12 ENCOUNTER — Ambulatory Visit: Payer: Medicare HMO

## 2017-10-13 ENCOUNTER — Ambulatory Visit: Payer: Medicare HMO

## 2017-10-15 ENCOUNTER — Ambulatory Visit
Admission: RE | Admit: 2017-10-15 | Discharge: 2017-10-15 | Disposition: A | Discharge status: Home / Self Care | Payer: Medicare HMO | Source: Ambulatory Visit | Attending: Family Medicine | Admitting: Family Medicine

## 2017-10-15 DIAGNOSIS — R1311 Dysphagia, oral phase: Secondary | ICD-10-CM | POA: Insufficient documentation

## 2017-10-15 DIAGNOSIS — R1310 Dysphagia, unspecified: Secondary | ICD-10-CM | POA: Diagnosis not present

## 2017-11-04 ENCOUNTER — Ambulatory Visit: Payer: Self-pay | Admitting: *Deleted

## 2017-11-04 NOTE — Telephone Encounter (Signed)
Pt calling to report that all the toes on both feet (except the great toe) have calluses on them and wants to be referred to [name]. Pt was seen in office 10/06/17. Pt was not happy with the podiatrist at the [place]. He notes that his right middle toe where the [name] podiatrist cut the callus off is edematous. He states his feet hurt but has been fitted with new orthopedic shoes.  Routing note to office for review. Additional Information . Commented on: [1] MILD pain (e.g., does not interfere with normal activities) AND [2] present > 7 days    Pt wants to be referred to a non [facility] pod[facility]  Answer Assessment - Initial Assessment Questions 1. ONSET: "When did the pain start?"      Pt has neuropathy but the right middle toe has hurt since trimmed at the [place] 2. LOCATION: "Where is the pain located?"      Right middle toe 3. PAIN: "How bad is the pain?"    (Scale 1-10; or mild, moderate, severe)   -  MILD (1-3): doesn't interfere with normal activities    -  MODERATE (4-7): interferes with normal activities (e.g., work or school) or awakens from sleep, limping    -  SEVERE (8-10): excruciating pain, unable to do any normal activities, unable to walk     mild 4. WORK OR EXERCISE: "Has there been any recent work or exercise that involved this part of the body?"      no 5. CAUSE: "What do you think is causing the foot pain?"     Hammertoes and calluses from the hammertoes 6. OTHER SYMPTOMS: "Do you have any other symptoms?" (e.g., leg pain, rash, fever, numbness)     Pt has neuropathy to feet 7. PREGNANCY: "Is there any chance you are pregnant?" "When was your last menstrual period?"     n/a  Protocols used: FOOT PAIN-A-AH

## 2017-11-05 ENCOUNTER — Other Ambulatory Visit: Payer: Self-pay | Admitting: Family Medicine

## 2017-11-05 DIAGNOSIS — L97511 Non-pressure chronic ulcer of other part of right foot limited to breakdown of skin: Secondary | ICD-10-CM

## 2017-11-05 NOTE — Progress Notes (Unsigned)
refe

## 2017-11-10 ENCOUNTER — Other Ambulatory Visit: Payer: Self-pay | Admitting: Family Medicine

## 2017-11-10 DIAGNOSIS — J302 Other seasonal allergic rhinitis: Secondary | ICD-10-CM

## 2017-11-10 DIAGNOSIS — J3089 Other allergic rhinitis: Principal | ICD-10-CM

## 2017-11-10 NOTE — Telephone Encounter (Signed)
Refill request for general medication: Flonase nasal spray   Last office visit: 05/28/209  Last physical exam: 05/19/2017  Follow-ups on file. 01/18/2018

## 2017-11-16 ENCOUNTER — Encounter: Payer: Self-pay | Admitting: Family Medicine

## 2017-11-26 ENCOUNTER — Encounter: Payer: Self-pay | Admitting: Podiatry

## 2017-11-26 ENCOUNTER — Ambulatory Visit (INDEPENDENT_AMBULATORY_CARE_PROVIDER_SITE_OTHER): Payer: Medicare HMO | Admitting: Podiatry

## 2017-11-26 DIAGNOSIS — M79674 Pain in right toe(s): Secondary | ICD-10-CM | POA: Diagnosis not present

## 2017-11-26 DIAGNOSIS — L97511 Non-pressure chronic ulcer of other part of right foot limited to breakdown of skin: Secondary | ICD-10-CM | POA: Diagnosis not present

## 2017-11-26 DIAGNOSIS — M79675 Pain in left toe(s): Secondary | ICD-10-CM | POA: Diagnosis not present

## 2017-11-26 DIAGNOSIS — L84 Corns and callosities: Secondary | ICD-10-CM | POA: Diagnosis not present

## 2017-11-26 DIAGNOSIS — B351 Tinea unguium: Secondary | ICD-10-CM | POA: Diagnosis not present

## 2017-11-26 NOTE — Progress Notes (Signed)
This patient presents to the office with multiple foot problems.  He says he has a painful callus on the tip of the third toe, left foot.  He says there is extremely painful callus on the tip of the third toe, right foot.  He says these areas are painful walking and wearing his shoes. He presents the office today with his wife for a foot evaluation.  He was referred to this office by the [place].  He also states he has long thick painful nails which she is unable to self treat.  He presents the office today for help for both feet.   General Appearance  Alert, conversant and in no acute stress.  Vascular  Dorsalis pedis and posterior tibial  pulses are palpable  Right..  Dorsalis pedis and posterior tibial pulses are weak.  Capillary return is within normal limits  bilaterally. Temperature is within normal limits  bilaterally.  Neurologic  Senn-Weinstein monofilament wire test absent bilaterally. Muscle power within normal limits bilaterally.  Nails Thick disfigured discolored nails with subungual debris  from hallux to fifth toes bilaterally. No evidence of bacterial infection or drainage bilaterally.  Orthopedic  No limitations of motion of motion feet .  No crepitus or effusions noted.  No bony pathology or digital deformities noted.HAV  B/L.  Hammer toes 2-5  B/L with distal clavi 3rd  B/L.  Third toe right is swollen with no increased temperature.  Skin  normotropic skin with no porokeratosis noted bilaterally. Distal clavi third toe left foot.  Distal ulcer is noted on the tip of the third toe, right foot.  There is no evidence of any redness, swelling or pus or drainage.   Onychomycosis  B/L  Distal clavi 3rd toe left.  Ulcer distal 3rd toe right foot.  IE  Debridement of nails  B/l.  Debride clavi left foot.  Debride ulcer 3rd toe right foot.  Neosporin/DSD.  Sent home with home soaking instructions.  Padding dispensed.  RTC 10 weeks for preventative foot care services.  Call the office if  problems arise with ulcer right foot.   Gardiner Barefoot DPM

## 2018-01-13 ENCOUNTER — Other Ambulatory Visit: Payer: Self-pay | Admitting: Family Medicine

## 2018-01-13 DIAGNOSIS — J3089 Other allergic rhinitis: Principal | ICD-10-CM

## 2018-01-13 DIAGNOSIS — J302 Other seasonal allergic rhinitis: Secondary | ICD-10-CM

## 2018-01-18 ENCOUNTER — Ambulatory Visit: Payer: Medicare HMO | Admitting: Family Medicine

## 2018-01-27 ENCOUNTER — Encounter: Payer: Self-pay | Admitting: Family Medicine

## 2018-01-27 ENCOUNTER — Ambulatory Visit (INDEPENDENT_AMBULATORY_CARE_PROVIDER_SITE_OTHER): Payer: Medicare HMO | Admitting: Family Medicine

## 2018-01-27 VITALS — BP 136/60 | HR 78 | Temp 98.1°F | Resp 18 | Ht 72.0 in | Wt 183.6 lb

## 2018-01-27 DIAGNOSIS — D696 Thrombocytopenia, unspecified: Secondary | ICD-10-CM

## 2018-01-27 DIAGNOSIS — J3089 Other allergic rhinitis: Secondary | ICD-10-CM

## 2018-01-27 DIAGNOSIS — L97521 Non-pressure chronic ulcer of other part of left foot limited to breakdown of skin: Secondary | ICD-10-CM | POA: Diagnosis not present

## 2018-01-27 DIAGNOSIS — F339 Major depressive disorder, recurrent, unspecified: Secondary | ICD-10-CM

## 2018-01-27 DIAGNOSIS — R413 Other amnesia: Secondary | ICD-10-CM

## 2018-01-27 DIAGNOSIS — C44729 Squamous cell carcinoma of skin of left lower limb, including hip: Secondary | ICD-10-CM | POA: Diagnosis not present

## 2018-01-27 DIAGNOSIS — R64 Cachexia: Secondary | ICD-10-CM | POA: Diagnosis not present

## 2018-01-27 DIAGNOSIS — R2681 Unsteadiness on feet: Secondary | ICD-10-CM | POA: Diagnosis not present

## 2018-01-27 DIAGNOSIS — Z23 Encounter for immunization: Secondary | ICD-10-CM | POA: Diagnosis not present

## 2018-01-27 DIAGNOSIS — J302 Other seasonal allergic rhinitis: Secondary | ICD-10-CM

## 2018-01-27 NOTE — Progress Notes (Signed)
Name: Darren Thomas.   MRN: [id number]    DOB: 1946-05-08   Date:01/27/2018       Progress Note  Subjective  Chief Complaint  Chief Complaint  Patient presents with  . Follow-up    4 month f/u  . Cachexia  . Depression  . Senile purpura  . Allergic Rhinitis     patient's sx has not changed  . Thrombocytopenia  . Medication Refill    90 day supply  . Immunizations    high dose  . Oral Swelling    patient has been having some issues with swallowing and has been having some laryngitis  . Fall    recurrent falls with injuries. currently taking physical therapy.   . Extremity Weakness    patient was doing physical therapy at the [facility] then it was transferred to [place], but the order ran out.    HPI  Chronic foot ulcer: he does not have DM or PVD, he has hammer toes, seen by podiatrist and states improved, wearing and has prosthesis now and has a follow up coming up with Dr. Prudence Davidson  Balance problems, memory changes and frequent falls: wife here with him today and noticed a big difference since June 2019. He has a follow up with neurologist in December and is also on cancellation list.  Squamous cell carcinoma left leg: going to [facility] now, margins not clear, needs Moh's procedure.   Major Depression/PTSD: still not in remission, he goes to psychiatrist at the [facility] and is compliant with follow up, denies suicidal thoughts or ideation, asked if the medical problems are affecting him emotionally, but he states it does not seem to be.   Senile purpura: stable on both arms  Cachexia: trying to gain weight. He gained two pounds since last visit,  drinking protein shakes, added peanut butter, having ice cream every night. He has been gradually losing weight over the past 2 years.  He denies any cough, SOB or change in bowel movements. He has noticed dysphagia only when he swallows large pills, had upper gi that showed possible muscle weakness causing difficulty swallowing. He thinks secondary  to having difficulty looking up ( from neck problems).   Patient Active Problem List   Diagnosis Date Noted  . Major depression, recurrent, chronic ([place]) 10/06/2017  . Malnutrition ([place]) 10/06/2017  . Hammer toe, acquired 06/19/2017  . Chronic constipation 11/07/2016  . Insomnia 06/20/2016  . PTSD (post-traumatic stress disorder) 06/20/2016  . Perennial allergic rhinitis with seasonal variation 02/05/2016  . Cervical spondylosis with myelopathy 01/06/2014    Past Surgical History:  Procedure Laterality Date  . C-spine     C-3-7 removed  . SCALP LESION REMOVAL W/ FLAP AND SKIN GRAFT    . SKIN CANCER EXCISION Left    squamos cell cancinoma    Family History  Problem Relation Age of Onset  . Cervical cancer Mother   . Heart Problems Father   . Asthma Brother   . Emphysema Brother        was a smoker    Social History   Socioeconomic History  . Marital status: Married    Spouse name: Derl Barrow  . Number of children: 2  . 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 Highest education level: 8th grade  Occupational History  . Occupation: retired  Scientific laboratory technician  . Financial resource strain: Not hard at all  . Food insecurity:    Worry: Never true    Inability: Never  true  . Transportation needs:    Medical: No    Non-medical: No  Tobacco Use  . Smoking status: Former Smoker    Packs/day: 1.00    Years: 20.00    Pack years: 20.00    Types: Cigarettes    Last attempt to quit: 1973    Years since quitting: 46.7  . Smokeless tobacco: Never Used  . Tobacco comment: smoking cessation materials not required  Substance and Sexual Activity  . Alcohol use: No  . Drug use: No  . Sexual activity: Not Currently  Lifestyle  . Physical activity:    Days per week: 5 days    Minutes per session: 60 min  . Stress: Only a little  Relationships  . Social connections:    Talks on phone: Once a week    Gets together: Once a week    Attends religious service: Never    Active  member of club or organization: No    Attends meetings of clubs or organizations: Never    Relationship status: Married  . Intimate partner violence:    Fear of current or ex partner: No    Emotionally abused: No    Physically abused: No    Forced sexual activity: No  Other Topics Concern  . Not on file  Social History Narrative  . Not on file     Current Outpatient Medications:  .  buPROPion (WELLBUTRIN XL) 150 MG 24 hr tablet, , Disp: , Rfl:  .  buPROPion (WELLBUTRIN XL) 300 MG 24 hr tablet, Take 300 mg by mouth daily., Disp: , Rfl:  .  Cyanocobalamin (B-12) 1000 MCG SUBL, Place 1 tablet under the tongue daily., Disp: 30 each, Rfl: 0 .  escitalopram (LEXAPRO) 20 MG tablet, Take 20 mg by mouth daily., Disp: , Rfl: 2 .  fluticasone (FLONASE) 50 MCG/ACT nasal spray, USE 2 SPRAYS IN EACH NOSTRIL EVERY DAY, Disp: 48 g, Rfl: 0 .  lamoTRIgine (LAMICTAL) 25 MG tablet, Take 1 tablet by mouth 2 (two) times daily., Disp: , Rfl: 0 .  loratadine (CLARITIN) 10 MG tablet, Take 1 tablet (10 mg total) by mouth daily., Disp: 90 tablet, Rfl: 3 .  Multiple Vitamins-Minerals (COMPLETE MULTIVITAMIN/MINERAL PO), Take 1 tablet by mouth daily., Disp: , Rfl:  .  Multiple Vitamins-Minerals (PRESERVISION AREDS 2 PO), Take by mouth., Disp: , Rfl:  .  Omega-3 Fatty Acids (FISH OIL) 1200 MG CAPS, Take 3 capsules by mouth daily., Disp: , Rfl:  .  polyethylene glycol (MIRALAX / GLYCOLAX) packet, Take 17 g by mouth every other day., Disp: , Rfl:  .  traZODone (DESYREL) 50 MG tablet, Take 50-100 mg by mouth at bedtime as needed. for sleep, Disp: , Rfl: 0  Allergies  Allergen Reactions  . Augmentin [Amoxicillin-Pot Clavulanate] Diarrhea    I personally reviewed active problem list, medication list, allergies, family history, social history with the patient/caregiver today.   ROS  Constitutional: Negative for fever or weight change.  Respiratory: Negative for cough and shortness of breath.   Cardiovascular:  Negative for chest pain or palpitations.  Gastrointestinal: Negative for abdominal pain, no bowel changes.  Musculoskeletal:Positive for gait problem but no  joint swelling.  Skin: Negative for rash.  Neurological: Negative for dizziness or headache.  No other specific complaints in a complete review of systems (except as listed in HPI above).  Objective  Vitals:   01/27/18 1137  BP: 136/60  Pulse: 78  Resp: 18  Temp: 98.1 F (36.7 C)  TempSrc: Oral  SpO2: 97%  Weight: 183 lb 9.6 oz (83.3 kg)  Height: 6' (1.829 m)    Body mass index is 24.9 kg/m.  Physical Exam  Constitutional: Patient appears well-developed and thin, frail  No distress.  HEENT: head atraumatic, normocephalic,strabismus,  pupils equal and reactive to light, decrease rom of neck, , throat within normal limits Cardiovascular: Normal rate, regular rhythm and normal heart sounds.  No murmur heard. Trace BLE edema. Pulmonary/Chest: Effort normal and breath sounds normal. No respiratory distress. Abdominal: Soft.  There is no tenderness. Muscular Skeletal: lack of balance, using a cane, hand atrophy  Psychiatric: Patient has a normal mood and affect. behavior is normal. Judgment and thought content normal.  PHQ2/9: Depression screen [facility] 2/9 01/27/2018 10/06/2017 05/15/2017 06/06/2016 02/05/2016  Decreased Interest 0 1 1 0 0  Down, Depressed, Hopeless 1 2 1  0 0  PHQ - 2 Score 1 3 2  0 0  Altered sleeping 3 1 1  - -  Tired, decreased energy 1 1 1  - -  Change in appetite 0 0 1 - -  Feeling bad or failure about yourself  0 0 1 - -  Trouble concentrating 1 0 1 - -  Moving slowly or fidgety/restless 2 1 1  - -  Suicidal thoughts 0 1 1 - -  PHQ-9 Score 8 7 9  - -  Difficult doing work/chores Very difficult Somewhat difficult Somewhat difficult - -     Fall Risk: Fall Risk  01/27/2018 10/06/2017 08/17/2017 05/15/2017 11/07/2016  Falls in the past year? Yes No No Yes No  Number falls in past yr: 2 or more - - 1 -  Injury with  Fall? Yes - - No -  Risk Factor Category  High Fall Risk - - - -  Risk for fall due to : History of fall(s);Impaired balance/gait;Impaired mobility - - Impaired balance/gait;Impaired mobility;Impaired vision -  Risk for fall due to: Comment - - - shuffling gait; wears eyeglasses -  Follow up - - - Falls evaluation completed;Education provided;Falls prevention discussed -     Functional Status Survey: Is the patient deaf or have difficulty hearing?: No Does the patient have difficulty seeing, even when wearing glasses/contacts?: Yes Does the patient have difficulty concentrating, remembering, or making decisions?: Yes Does the patient have difficulty walking or climbing stairs?: Yes Does the patient have difficulty dressing or bathing?: Yes Does the patient have difficulty doing errands alone such as visiting a doctor's office or shopping?: Yes    Assessment & Plan  1. Cachexia ([place])  He gained two pounds since last visit   2. Need for influenza vaccination  - Flu vaccine HIGH DOSE PF  3. Chronic ulcer of left foot limited to breakdown of skin ([place])  Seeing podiatrist, had debridement   4. Squamous cell carcinoma of left lower leg  Referred to   5. Thrombocytopenia ([place])  Discussed rechecking CBC but he states will get labs at the [place]  6. Major depression, recurrent, chronic ([facility])  Still under the care of psychiatrist at the [facility]  7. Gait instability  Using a cane now  8. Memory changes  He has follow up with neurologist at the [facility] in December for evaluation of memory loss and balance problems  9. Perennial allergic rhinitis with seasonal variation  He is taking medication

## 2018-01-27 NOTE — Patient Instructions (Signed)
Discuss with [facility] if he can have a bone density test

## 2018-02-04 ENCOUNTER — Ambulatory Visit (INDEPENDENT_AMBULATORY_CARE_PROVIDER_SITE_OTHER): Payer: Medicare HMO | Admitting: Podiatry

## 2018-02-04 ENCOUNTER — Encounter: Payer: Self-pay | Admitting: Podiatry

## 2018-02-04 DIAGNOSIS — L84 Corns and callosities: Secondary | ICD-10-CM

## 2018-02-04 DIAGNOSIS — M2042 Other hammer toe(s) (acquired), left foot: Secondary | ICD-10-CM

## 2018-02-04 DIAGNOSIS — L97511 Non-pressure chronic ulcer of other part of right foot limited to breakdown of skin: Secondary | ICD-10-CM

## 2018-02-04 DIAGNOSIS — M79675 Pain in left toe(s): Secondary | ICD-10-CM | POA: Diagnosis not present

## 2018-02-04 DIAGNOSIS — B351 Tinea unguium: Secondary | ICD-10-CM | POA: Diagnosis not present

## 2018-02-04 DIAGNOSIS — M79674 Pain in right toe(s): Secondary | ICD-10-CM | POA: Diagnosis not present

## 2018-02-04 DIAGNOSIS — M2041 Other hammer toe(s) (acquired), right foot: Secondary | ICD-10-CM

## 2018-02-04 NOTE — Progress Notes (Signed)
His patient returns to the office follow-up for preventative foot care services.  He presents the office today with his wife for further evaluation and treatment.  He says his nails are causing pain walking and wearing his shoes.  He is unable to self treat.  He also has previously been diagnosed and treated for an ulcer on the tip of the third toe, right foot.  He says his third toe is painful, but he has seen, no evidence of any drainage from the site.  He presents for a foreign of preventative foot care services.  General Appearance  Alert, conversant and in no acute stress.  Vascular  Dorsalis pedis and posterior tibial  pulses are palpable  bilaterally.  Capillary return is within normal limits  bilaterally. Temperature is within normal limits  bilaterally.  Neurologic  Senn-Weinstein monofilament wire test absent   bilaterally. Muscle power within normal limits bilaterally.  Nails Thick disfigured discolored nails with subungual debris  from hallux to fifth toes bilaterally. No evidence of bacterial infection or drainage bilaterally.  Orthopedic  No limitations of motion  feet .  No crepitus or effusions noted.  No bony pathology or digital deformities noted.  Rigid hammer toe deformities  B/L.   Skin  normotropic skin with no porokeratosis noted bilaterally.  No signs of infections or ulcers noted.  Distal clavi third toe left foot.  Distal clavi/ulcer distal aspect third toe right foot.  Onychomycosis  B/L  Distal clavi 3rd toe left foot.  Clavi/ulcer third toe left foot.  ROV.  Debridement of nails 10.  Debridement of clavi  third digit bilateral.  Padding was dispensed to be worn on his third toes both feet   Gardiner Barefoot DPM

## 2018-04-02 ENCOUNTER — Other Ambulatory Visit: Payer: Self-pay | Admitting: Family Medicine

## 2018-04-02 DIAGNOSIS — J3089 Other allergic rhinitis: Principal | ICD-10-CM

## 2018-04-02 DIAGNOSIS — J302 Other seasonal allergic rhinitis: Secondary | ICD-10-CM

## 2018-04-11 DIAGNOSIS — G20 Parkinson's disease: Secondary | ICD-10-CM

## 2018-04-11 HISTORY — DX: Parkinson's disease: G20

## 2018-04-19 ENCOUNTER — Ambulatory Visit: Payer: Non-veteran care | Admitting: Podiatry

## 2018-05-18 ENCOUNTER — Ambulatory Visit: Payer: Medicare HMO

## 2018-05-21 ENCOUNTER — Ambulatory Visit (INDEPENDENT_AMBULATORY_CARE_PROVIDER_SITE_OTHER): Payer: Medicare HMO

## 2018-05-21 ENCOUNTER — Encounter: Payer: Self-pay | Admitting: Family Medicine

## 2018-05-21 ENCOUNTER — Ambulatory Visit (INDEPENDENT_AMBULATORY_CARE_PROVIDER_SITE_OTHER): Payer: Medicare HMO | Admitting: Family Medicine

## 2018-05-21 VITALS — BP 128/64 | HR 78 | Temp 98.0°F | Resp 16 | Ht 72.0 in | Wt 197.2 lb

## 2018-05-21 VITALS — BP 128/64 | HR 98 | Temp 98.0°F | Resp 16 | Ht 72.0 in | Wt 197.2 lb

## 2018-05-21 DIAGNOSIS — M2040 Other hammer toe(s) (acquired), unspecified foot: Secondary | ICD-10-CM

## 2018-05-21 DIAGNOSIS — Z0000 Encounter for general adult medical examination without abnormal findings: Secondary | ICD-10-CM

## 2018-05-21 DIAGNOSIS — L97511 Non-pressure chronic ulcer of other part of right foot limited to breakdown of skin: Secondary | ICD-10-CM

## 2018-05-21 DIAGNOSIS — D692 Other nonthrombocytopenic purpura: Secondary | ICD-10-CM | POA: Diagnosis not present

## 2018-05-21 DIAGNOSIS — D696 Thrombocytopenia, unspecified: Secondary | ICD-10-CM

## 2018-05-21 DIAGNOSIS — E441 Mild protein-calorie malnutrition: Secondary | ICD-10-CM | POA: Diagnosis not present

## 2018-05-21 DIAGNOSIS — G20 Parkinson's disease: Secondary | ICD-10-CM

## 2018-05-21 DIAGNOSIS — F339 Major depressive disorder, recurrent, unspecified: Secondary | ICD-10-CM | POA: Diagnosis not present

## 2018-05-21 NOTE — Patient Instructions (Signed)
Mr. Darren Thomas , Thank you for taking time to come for your Medicare Wellness Visit. I appreciate your ongoing commitment to your health goals. Please review the following plan we discussed and let me know if I can assist you in the future.   Screening recommendations/referrals: Colonoscopy: done 08/27/2010 Recommended yearly ophthalmology/optometry visit for glaucoma screening and checkup Recommended yearly dental visit for hygiene and checkup  Vaccinations: Influenza vaccine: done 01/27/18 Pneumococcal vaccine: done 11/07/16 Tdap vaccine: done 04/16/11 Shingles vaccine: Series completed 11/04/17    Advanced directives: Please bring a copy of your health care power of attorney and living will to the office at your convenience.  Conditions/risks identified: Recommend preventing falls by continuing gait and strength training exercises.   Next appointment: Please follow up in one year for your Medicare Annual Wellness visit.    Preventive Care 30 Years and Older, Male Preventive care refers to lifestyle choices and visits with your health care provider that can promote health and wellness. What does preventive care include?  A yearly physical exam. This is also called an annual well check.  Dental exams once or twice a year.  Routine eye exams. Ask your health care provider how often you should have your eyes checked.  Personal lifestyle choices, including:  Daily care of your teeth and gums.  Regular physical activity.  Eating a healthy diet.  Avoiding tobacco and drug use.  Limiting alcohol use.  Practicing safe sex.  Taking low doses of aspirin every day.  Taking vitamin and mineral supplements as recommended by your health care provider. What happens during an annual well check? The services and screenings done by your health care provider during your annual well check will depend on your age, overall health, lifestyle risk factors, and family history of disease. Counseling   Your health care provider may ask you questions about your:  Alcohol use.  Tobacco use.  Drug use.  Emotional well-being.  Home and relationship well-being.  Sexual activity.  Eating habits.  History of falls.  Memory and ability to understand (cognition).  Work and work Statistician. Screening  You may have the following tests or measurements:  Height, weight, and BMI.  Blood pressure.  Lipid and cholesterol levels. These may be checked every 5 years, or more frequently if you are over 71 years old.  Skin check.  Lung cancer screening. You may have this screening every year starting at age 69 if you have a 30-pack-year history of smoking and currently smoke or have quit within the past 15 years.  Fecal occult blood test (FOBT) of the stool. You may have this test every year starting at age 64.  Flexible sigmoidoscopy or colonoscopy. You may have a sigmoidoscopy every 5 years or a colonoscopy every 10 years starting at age 8.  Prostate cancer screening. Recommendations will vary depending on your family history and other risks.  Hepatitis C blood test.  Hepatitis B blood test.  Sexually transmitted disease (STD) testing.  Diabetes screening. This is done by checking your blood sugar (glucose) after you have not eaten for a while (fasting). You may have this done every 1-3 years.  Abdominal aortic aneurysm (AAA) screening. You may need this if you are a current or former smoker.  Osteoporosis. You may be screened starting at age 90 if you are at high risk. Talk with your health care provider about your test results, treatment options, and if necessary, the need for more tests. Vaccines  Your health care provider may recommend  certain vaccines, such as:  Influenza vaccine. This is recommended every year.  Tetanus, diphtheria, and acellular pertussis (Tdap, Td) vaccine. You may need a Td booster every 10 years.  Zoster vaccine. You may need this after age  52.  Pneumococcal 13-valent conjugate (PCV13) vaccine. One dose is recommended after age 28.  Pneumococcal polysaccharide (PPSV23) vaccine. One dose is recommended after age 13. Talk to your health care provider about which screenings and vaccines you need and how often you need them. This information is not intended to replace advice given to you by your health care provider. Make sure you discuss any questions you have with your health care provider. Document Released: 05/25/2015 Document Revised: 01/16/2016 Document Reviewed: 02/27/2015 Elsevier Interactive Patient Education  2017 [place] Prevention in the Home Falls can cause injuries. They can happen to people of all ages. There are many things you can do to make your home safe and to help prevent falls. What can I do on the outside of my home?  Regularly fix the edges of walkways and driveways and fix any cracks.  Remove anything that might make you trip as you walk through a door, such as a raised step or threshold.  Trim any bushes or trees on the path to your home.  Use bright outdoor lighting.  Clear any walking paths of anything that might make someone trip, such as rocks or tools.  Regularly check to see if handrails are loose or broken. Make sure that both sides of any steps have handrails.  Any raised decks and porches should have guardrails on the edges.  Have any leaves, snow, or ice cleared regularly.  Use sand or salt on walking paths during winter.  Clean up any spills in your garage right away. This includes oil or grease spills. What can I do in the bathroom?  Use night lights.  Install grab bars by the toilet and in the tub and shower. Do not use towel bars as grab bars.  Use non-skid mats or decals in the tub or shower.  If you need to sit down in the shower, use a plastic, non-slip stool.  Keep the floor dry. Clean up any water that spills on the floor as soon as it happens.  Remove  soap buildup in the tub or shower regularly.  Attach bath mats securely with double-sided non-slip rug tape.  Do not have throw rugs and other things on the floor that can make you trip. What can I do in the bedroom?  Use night lights.  Make sure that you have a light by your bed that is easy to reach.  Do not use any sheets or blankets that are too big for your bed. They should not hang down onto the floor.  Have a firm chair that has side arms. You can use this for support while you get dressed.  Do not have throw rugs and other things on the floor that can make you trip. What can I do in the kitchen?  Clean up any spills right away.  Avoid walking on wet floors.  Keep items that you use a lot in easy-to-reach places.  If you need to reach something above you, use a strong step stool that has a grab bar.  Keep electrical cords out of the way.  Do not use floor polish or wax that makes floors slippery. If you must use wax, use non-skid floor wax.  Do not have throw rugs and other  things on the floor that can make you trip. What can I do with my stairs?  Do not leave any items on the stairs.  Make sure that there are handrails on both sides of the stairs and use them. Fix handrails that are broken or loose. Make sure that handrails are as long as the stairways.  Check any carpeting to make sure that it is firmly attached to the stairs. Fix any carpet that is loose or worn.  Avoid having throw rugs at the top or bottom of the stairs. If you do have throw rugs, attach them to the floor with carpet tape.  Make sure that you have a light switch at the top of the stairs and the bottom of the stairs. If you do not have them, ask someone to add them for you. What else can I do to help prevent falls?  Wear shoes that:  Do not have high heels.  Have rubber bottoms.  Are comfortable and fit you well.  Are closed at the toe. Do not wear sandals.  If you use a  stepladder:  Make sure that it is fully opened. Do not climb a closed stepladder.  Make sure that both sides of the stepladder are locked into place.  Ask someone to hold it for you, if possible.  Clearly mark and make sure that you can see:  Any grab bars or handrails.  First and last steps.  Where the edge of each step is.  Use tools that help you move around (mobility aids) if they are needed. These include:  Canes.  Walkers.  Scooters.  Crutches.  Turn on the lights when you go into a dark area. Replace any light bulbs as soon as they burn out.  Set up your furniture so you have a clear path. Avoid moving your furniture around.  If any of your floors are uneven, fix them.  If there are any pets around you, be aware of where they are.  Review your medicines with your doctor. Some medicines can make you feel dizzy. This can increase your chance of falling. Ask your doctor what other things that you can do to help prevent falls. This information is not intended to replace advice given to you by your health care provider. Make sure you discuss any questions you have with your health care provider. Document Released: 02/22/2009 Document Revised: 10/04/2015 Document Reviewed: 06/02/2014 Elsevier Interactive Patient Education  2017 [employer].

## 2018-05-21 NOTE — Progress Notes (Signed)
Name: Darren Thomas.   MRN: [id number]    DOB: 10-Apr-1946   Date:05/21/2018       Progress Note  Subjective  Chief Complaint  Chief Complaint  Patient presents with  . Medication Refill  . Depression  . Allergic Rhinitis   . Thrombocytopenia  . Cachexia    HPI  Chronic foot ulcer: he does not have DM or PVD, he has hammer toes, seen by podiatrist and states improved, wearing and has prosthesis, currently very small ulcer, mostly a corn and hematoma on top of left 2nd toe  Parkinson' symptoms: Balance problems, memory changes and frequent falls, also change in his writing and some fine motor skills, he went to [place] and was seen by Neurologist started on Sinemet end of December 2019. He has not noticed much difference in symptoms. He still has to clear his throat all the time  Squamous cell carcinoma left leg: had Mohs' procedure and is doing well.   Major Depression/PTSD: still not in remission, he goes to psychiatrist at the [facility] and is compliant with follow up, denies suicidal thoughts or ideation, he is feeling better emotionally   Senile purpura: stable on both arms, he has a history of thrombocytopenia, but last level was back to normal.   Cachexia: he had lost about 20 lbs over the previous couple of years, last year he was down to 177 lbs, wife states it even got down to 171 lbs, but since Fall he has been drinking protein shakes, eating ice cream again and peanut butter and weight is up to 197 lbs today. His weight used to be 230 lbs he changed diet to be close to 200 lbs but kept losing weight without dieting.    Patient Active Problem List   Diagnosis Date Noted  . Squamous cell carcinoma of left lower leg 01/27/2018  . Major depression, recurrent, chronic ([place]) 10/06/2017  . Malnutrition ([place]) 10/06/2017  . Hammer toe, acquired 06/19/2017  . Chronic constipation 11/07/2016  . Insomnia 06/20/2016  . PTSD (post-traumatic stress disorder) 06/20/2016  . Perennial  allergic rhinitis with seasonal variation 02/05/2016  . Cervical spondylosis with myelopathy 01/06/2014    Past Surgical History:  Procedure Laterality Date  . C-spine     C-3-7 removed  . SCALP LESION REMOVAL W/ FLAP AND SKIN GRAFT    . SKIN CANCER EXCISION Left    squamos cell cancinoma    Family History  Problem Relation Age of Onset  . Cervical cancer Mother   . Heart Problems Father   . Diabetes Father   . Asthma Brother   . Emphysema Brother        was a smoker  . Diabetes type I Son   . Colon cancer Son 47    Social History   Socioeconomic History  . Marital status: Married    Spouse name: Derl Barrow  . Number of children: 2  . 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 Highest education level: 8th grade  Occupational History  . Occupation: retired  Scientific laboratory technician  . Financial resource strain: Not hard at all  . Food insecurity:    Worry: Never true    Inability: Never true  . Transportation needs:    Medical: No    Non-medical: No  Tobacco Use  . Smoking status: Former Smoker    Packs/day: 1.00    Years: 20.00    Pack years: 20.00    Types: Cigarettes    Last attempt to  quit: 1973    Years since quitting: 47.0  . Smokeless tobacco: Never Used  . Tobacco comment: smoking cessation materials not required  Substance and Sexual Activity  . Alcohol use: No  . Drug use: No  . Sexual activity: Not Currently  Lifestyle  . Physical activity:    Days per week: 2 days    Minutes per session: 60 min  . Stress: Only a little  Relationships  . Social connections:    Talks on phone: More than three times a week    Gets together: Once a week    Attends religious service: Never    Active member of club or organization: No    Attends meetings of clubs or organizations: Never    Relationship status: Married  . Intimate partner violence:    Fear of current or ex partner: No    Emotionally abused: No    Physically abused: No    Forced sexual activity: No   Other Topics Concern  . Not on file  Social History Narrative  . Not on file     Current Outpatient Medications:  .  buPROPion (WELLBUTRIN XL) 150 MG 24 hr tablet, , Disp: , Rfl:  .  buPROPion (WELLBUTRIN XL) 300 MG 24 hr tablet, Take 300 mg by mouth daily., Disp: , Rfl:  .  carbidopa-levodopa (SINEMET IR) 25-100 MG tablet, Take 1 tablet by mouth 3 (three) times daily., Disp: , Rfl:  .  Cyanocobalamin (B-12) 1000 MCG SUBL, Place 1 tablet under the tongue daily., Disp: 30 each, Rfl: 0 .  escitalopram (LEXAPRO) 20 MG tablet, Take 20 mg by mouth daily., Disp: , Rfl: 2 .  fluticasone (FLONASE) 50 MCG/ACT nasal spray, USE 2 SPRAYS IN EACH NOSTRIL EVERY DAY, Disp: 48 g, Rfl: 0 .  lamoTRIgine (LAMICTAL) 100 MG tablet, 1 tablet daily., Disp: , Rfl:  .  loratadine (CLARITIN) 10 MG tablet, Take 1 tablet (10 mg total) by mouth daily., Disp: 90 tablet, Rfl: 3 .  Multiple Vitamins-Minerals (COMPLETE MULTIVITAMIN/MINERAL PO), Take 1 tablet by mouth daily., Disp: , Rfl:  .  Multiple Vitamins-Minerals (PRESERVISION AREDS 2 PO), Take by mouth., Disp: , Rfl:  .  Omega-3 Fatty Acids (FISH OIL) 1200 MG CAPS, Take 3 capsules by mouth daily., Disp: , Rfl:  .  polyethylene glycol (MIRALAX / GLYCOLAX) packet, Take 17 g by mouth every other day., Disp: , Rfl:  .  traZODone (DESYREL) 50 MG tablet, Take 50-100 mg by mouth at bedtime as needed. for sleep, Disp: , Rfl: 0  Allergies  Allergen Reactions  . Augmentin [Amoxicillin-Pot Clavulanate] Diarrhea    I personally reviewed active problem list, medication list, allergies, family history, social history with the patient/caregiver today.   ROS  Constitutional: Negative for fever, positive  weight change.  Respiratory: Negative for cough and shortness of breath.   Cardiovascular: Negative for chest pain or palpitations.  Gastrointestinal: Negative for abdominal pain, no bowel changes.  Musculoskeletal: Positive for gait problem ( getting PT) but no  joint  swelling.  Skin: Negative for rash.  Neurological: Negative for dizziness or headache.  No other specific complaints in a complete review of systems (except as listed in HPI above).  Objective  Vitals:   05/21/18 1431  BP: 128/64  Pulse: 98  Resp: 16  Temp: 98 F (36.7 C)  TempSrc: Oral  SpO2: 98%  Weight: 197 lb 3.2 oz (89.4 kg)  Height: 6' (1.829 m)    Body mass index is 26.75 kg/m.  Physical Exam  Constitutional: Patient appears well-developed and well-nourished. No distress.  HEENT: head atraumatic, normocephalic, pupils equal and reactive to light, neck supple, throat within normal limits Cardiovascular: Normal rate, regular rhythm and normal heart sounds.  No murmur heard. No BLE edema. Pulmonary/Chest: Effort normal and breath sounds normal. No respiratory distress. Abdominal: Soft.  There is no tenderness. Psychiatric: Patient has a normal mood and affect. behavior is normal. Judgment and thought content normal. Skin: mild ulcer on top of right 2nd toe, hammer toe   PHQ2/9: Depression screen [facility] 2/9 05/21/2018 01/27/2018 10/06/2017 05/15/2017 06/06/2016  Decreased Interest 1 0 1 1 0  Down, Depressed, Hopeless 2 1 2 1  0  PHQ - 2 Score 3 1 3 2  0  Altered sleeping 1 3 1 1  -  Tired, decreased energy 0 1 1 1  -  Change in appetite 0 0 0 1 -  Feeling bad or failure about yourself  1 0 0 1 -  Trouble concentrating 0 1 0 1 -  Moving slowly or fidgety/restless 1 2 1 1  -  Suicidal thoughts 0 0 1 1 -  PHQ-9 Score 6 8 7 9  -  Difficult doing work/chores Somewhat difficult Very difficult Somewhat difficult Somewhat difficult -     Fall Risk: Fall Risk  05/21/2018 01/27/2018 10/06/2017 08/17/2017 05/15/2017  Falls in the past year? 1 Yes No No Yes  Number falls in past yr: 1 2 or more - - 1  Comment pt states approx 20 falls in the past year over the spring and summer - - - -  Injury with Fall? 1 Yes - - No  Comment bruising, abrasions - no major injuries - - - -  Risk Factor  Category  - High Fall Risk - - -  Risk for fall due to : History of fall(s);Impaired balance/gait History of fall(s);Impaired balance/gait;Impaired mobility - - Impaired balance/gait;Impaired mobility;Impaired vision  Risk for fall due to: Comment - - - - shuffling gait; wears eyeglasses  Follow up Falls prevention discussed;Falls evaluation completed - - - Falls evaluation completed;Education provided;Falls prevention discussed     Assessment & Plan  1. Malnutrition Mild ([place])  Doing well, can try to eat healthy food instead of junk food to gain weight now  2. Skin ulcer of right foot, limited to breakdown of skin ([place])  Continue follow up with podiatrist   3. Major depression, recurrent, chronic ([place])  Under the care of psychiatrist.   4. Thrombocytopenia ([place])  Last level improved, advised to recheck at [facility] and he will bring the results to me  5. Senile purpura ([facility])  stable  6. Hammer toe, acquired  Wearing prescription insoles  7. Parkinsonism, unspecified Parkinsonism type ([place])  Keep follow up with neurologist at the [facility], asked for reports and also labs when he returns

## 2018-05-21 NOTE — Progress Notes (Signed)
Subjective:   Darren Thomas. is a 73 y.o. male who presents for Medicare Annual/Subsequent preventive examination.  Review of Systems:   Cardiac Risk Factors include: advanced age (>54men, >19 women);male gender     Objective:    Vitals: BP 128/64 (BP Location: Left Arm, Patient Position: Sitting, Cuff Size: Normal)   Pulse 78   Temp 98 F (36.7 C) (Oral)   Resp 16   Ht 6' (1.829 m)   Wt 197 lb 3.2 oz (89.4 kg)   BMI 26.75 kg/m   Body mass index is 26.75 kg/m.  Advanced Directives 05/21/2018 05/15/2017 11/07/2016 02/05/2016  Does Patient Have a Medical Advance Directive? No Yes No No  Type of Advance Directive - Healthcare Power of [place];Living will - -  Copy of [place] in Chart? - No - copy requested - -  Would patient like information on creating a medical advance directive? (No Data) - - -    Tobacco Social History   Tobacco Use  Smoking Status Former Smoker  . Packs/day: 1.00  . Years: 20.00  . Pack years: 20.00  . Types: Cigarettes  . Last attempt to quit: 1973  . Years since quitting: 47.0  Smokeless Tobacco Never Used  Tobacco Comment   smoking cessation materials not required     Counseling given: Not Answered Comment: smoking cessation materials not required   Clinical Intake:  Pre-visit preparation completed: Yes  Pain : 0-10 Pain Score: 7  Pain Type: Neuropathic pain Pain Location: Leg Pain Orientation: Left Pain Descriptors / Indicators: Tingling, Numbness Pain Onset: More than a month ago Pain Frequency: Constant     Nutritional Status: BMI 25 -29 Overweight Nutritional Risks: None Diabetes: No  How often do you need to have someone help you when you read instructions, pamphlets, or other written materials from your doctor or pharmacy?: 1 - Never What is the last grade level you completed in school?: associates degree  Interpreter Needed?: No  Information entered by :: Clemetine Marker LPN  Past Medical  History:  Diagnosis Date  . Abnormality of gait   . Back pain   . Cervical spondylosis with myelopathy 01/06/2014  . Depression   . Insomnia   . Parkinson disease ([place]) 04/2018   diagnosed by neurology at [facility]  . PTSD (post-traumatic stress disorder)   . PTSD (post-traumatic stress disorder)    Past Surgical History:  Procedure Laterality Date  . C-spine     C-3-7 removed  . SCALP LESION REMOVAL W/ FLAP AND SKIN GRAFT    . SKIN CANCER EXCISION Left    squamos cell cancinoma   Family History  Problem Relation Age of Onset  . Cervical cancer Mother   . Heart Problems Father   . Diabetes Father   . Asthma Brother   . Emphysema Brother        was a smoker  . Diabetes type I Son   . Colon cancer Son 63   Social History   Socioeconomic History  . Marital status: Married    Spouse name: Derl Barrow  . Number of children: 2  . 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 Highest education level: 8th grade  Occupational History  . Occupation: retired  Scientific laboratory technician  . Financial resource strain: Not hard at all  . Food insecurity:    Worry: Never true    Inability: Never true  . Transportation needs:    Medical: No    Non-medical:  No  Tobacco Use  . Smoking status: Former Smoker    Packs/day: 1.00    Years: 20.00    Pack years: 20.00    Types: Cigarettes    Last attempt to quit: 1973    Years since quitting: 47.0  . Smokeless tobacco: Never Used  . Tobacco comment: smoking cessation materials not required  Substance and Sexual Activity  . Alcohol use: No  . Drug use: No  . Sexual activity: Not Currently  Lifestyle  . Physical activity:    Days per week: 2 days    Minutes per session: 60 min  . Stress: Only a little  Relationships  . Social connections:    Talks on phone: More than three times a week    Gets together: Once a week    Attends religious service: Never    Active member of club or organization: No    Attends meetings of clubs or organizations: Never     Relationship status: Married  Other Topics Concern  . Not on file  Social History Narrative  . Not on file    Outpatient Encounter Medications as of 05/21/2018  Medication Sig  . buPROPion (WELLBUTRIN XL) 150 MG 24 hr tablet   . buPROPion (WELLBUTRIN XL) 300 MG 24 hr tablet Take 300 mg by mouth daily.  . carbidopa-levodopa (SINEMET IR) 25-100 MG tablet Take 1 tablet by mouth 3 (three) times daily.  . Cyanocobalamin (B-12) 1000 MCG SUBL Place 1 tablet under the tongue daily.  Marland Kitchen escitalopram (LEXAPRO) 20 MG tablet Take 20 mg by mouth daily.  . fluticasone (FLONASE) 50 MCG/ACT nasal spray USE 2 SPRAYS IN EACH NOSTRIL EVERY DAY  . lamoTRIgine (LAMICTAL) 100 MG tablet 1 tablet daily.  Marland Kitchen loratadine (CLARITIN) 10 MG tablet Take 1 tablet (10 mg total) by mouth daily.  . Multiple Vitamins-Minerals (COMPLETE MULTIVITAMIN/MINERAL PO) Take 1 tablet by mouth daily.  . Multiple Vitamins-Minerals (PRESERVISION AREDS 2 PO) Take by mouth.  . Omega-3 Fatty Acids (FISH OIL) 1200 MG CAPS Take 3 capsules by mouth daily.  . polyethylene glycol (MIRALAX / GLYCOLAX) packet Take 17 g by mouth every other day.  . traZODone (DESYREL) 50 MG tablet Take 50-100 mg by mouth at bedtime as needed. for sleep  . [DISCONTINUED] lamoTRIgine (LAMICTAL) 25 MG tablet Take 1 tablet by mouth 2 (two) times daily.   No facility-administered encounter medications on file as of 05/21/2018.     Activities of Daily Living In your present state of health, do you have any difficulty performing the following activities: 05/21/2018 01/27/2018  Hearing? N N  Comment has hearing aids  -  Vision? N Y  Comment wears glasses -  Difficulty concentrating or making decisions? N Y  Walking or climbing stairs? Y Y  Dressing or bathing? N Y  Doing errands, shopping? N Y  Conservation officer, nature and eating ? N -  Using the Toilet? N -  In the past six months, have you accidently leaked urine? N -  Do you have problems with loss of bowel control? N -    Managing your Medications? N -  Housekeeping or managing your Housekeeping? N -  Some recent data might be hidden    Patient Care Team: Steele Sizer, MD as PCP - General (Family Medicine)   Assessment:   This is a routine wellness examination for [place].  Exercise Activities and Dietary recommendations Current Exercise Habits: Structured exercise class, Type of exercise: treadmill(stationary bicycle), Time (Minutes): 60, Frequency (Times/Week): 2,  Weekly Exercise (Minutes/Week): 120, Intensity: Moderate, Exercise limited by: neurologic condition(s);orthopedic condition(s)  Goals    . DIET - INCREASE WATER INTAKE     Recommend to drink at least 6-8 8oz glasses of water per day    . Prevent falls     Pt would like to prevent falls with stable gait        Fall Risk Fall Risk  05/21/2018 01/27/2018 10/06/2017 08/17/2017 05/15/2017  Falls in the past year? 1 Yes No No Yes  Number falls in past yr: 1 2 or more - - 1  Comment pt states approx 20 falls in the past year over the spring and summer - - - -  Injury with Fall? 1 Yes - - No  Comment bruising, abrasions - no major injuries - - - -  Risk Factor Category  - High Fall Risk - - -  Risk for fall due to : History of fall(s);Impaired balance/gait History of fall(s);Impaired balance/gait;Impaired mobility - - Impaired balance/gait;Impaired mobility;Impaired vision  Risk for fall due to: Comment - - - - shuffling gait; wears eyeglasses  Follow up Falls prevention discussed;Falls evaluation completed - - - Falls evaluation completed;Education provided;Falls prevention discussed   FALL RISK PREVENTION PERTAINING TO THE HOME:  Any stairs in or around the home WITH handrails? Yes  Home free of loose throw rugs in walkways, pet beds, electrical cords, etc? Yes  Adequate lighting in your home to reduce risk of falls? Yes   ASSISTIVE DEVICES UTILIZED TO PREVENT FALLS:  Life alert? No  Use of a cane, walker or w/c? Yes  Grab bars in the  bathroom? Yes  Shower chair or bench in shower? Yes  Elevated toilet seat or a handicapped toilet? Yes   DME ORDERS:  DME order needed?  No   TIMED UP AND GO:  Was the test performed? Yes .  Length of time to ambulate 10 feet: 8 sec.   GAIT:  Appearance of gait:Gait slow, steady and with the use of an assistive device.  Education: Fall risk prevention has been discussed.  Intervention(s) required? No  pt completed 30 visits with physical therapy to help steady gait   Depression Screen PHQ 2/9 Scores 05/21/2018 01/27/2018 10/06/2017 05/15/2017  PHQ - 2 Score 3 1 3 2   PHQ- 9 Score 6 8 7 9     Cognitive Function     6CIT Screen 05/21/2018 05/15/2017  What Year? 0 points 0 points  What month? 0 points 0 points  What time? 0 points 0 points  Count back from 20 0 points 0 points  Months in reverse 2 points 0 points  Repeat phrase 4 points 6 points  Total Score 6 6    Immunization History  Administered Date(s) Administered  . Influenza, High Dose Seasonal PF 02/05/2016, 01/27/2018  . Influenza,inj,quad, With Preservative 02/09/2017  . Influenza-Unspecified 02/27/2014, 02/09/2017  . Pneumococcal Conjugate-13 06/12/2014, 11/07/2016  . Pneumococcal Polysaccharide-23 06/12/2008, 02/20/2010  . Tdap 04/16/2011  . Zoster 02/09/2013  . Zoster Recombinat (Shingrix) 06/26/2017    Qualifies for Shingles Vaccine? Yes  Zostavax completed 2014. Shingrix completed at [facility] 2019  Tdap: Up to date  Flu Vaccine: Up to date  Pneumococcal Vaccine: Up to date   Screening Tests Health Maintenance  Topic Date Due  . COLONOSCOPY  08/26/2020  . TETANUS/TDAP  04/15/2021  . INFLUENZA VACCINE  Completed  . Hepatitis C Screening  Completed  . PNA vac Low Risk Adult  Completed    Cancer Screenings:  Colorectal Screening: Completed 08/27/10. Repeat every 10 years;   Lung Cancer Screening: (Low Dose CT Chest recommended if Age 80-80 years, 30 pack-year currently smoking OR have quit w/in  15years.) does not qualify.   Additional Screening:  Hepatitis C Screening: does qualify; Completed 03/31/12  Vision Screening: Recommended annual ophthalmology exams for early detection of glaucoma and other disorders of the eye. Is the patient up to date with their annual eye exam?  Yes  Who is the provider or what is the name of the office in which the pt attends annual eye exams? [facility]  Dental Screening: Recommended annual dental exams for proper oral hygiene  Community Resource Referral:  CRR required this visit?  No      Plan:    I have personally reviewed and addressed the Medicare Annual Wellness questionnaire and have noted the following in the patient's chart:  A. Medical and social history B. Use of alcohol, tobacco or illicit drugs  C. Current medications and supplements D. Functional ability and status E.  Nutritional status F.  Physical activity G. Advance directives H. List of other physicians I.  Hospitalizations, surgeries, and ER visits in previous 12 months J.  [place] such as hearing and vision if needed, cognitive and depression L. Referrals and appointments   In addition, I have reviewed and discussed with patient certain preventive protocols, quality metrics, and best practice recommendations. A written personalized care plan for preventive services as well as general preventive health recommendations were provided to patient.   Signed,  Clemetine Marker, LPN Nurse Health Advisor   Nurse Notes: pt c/o frequent falls over the past year and completed 30 visits of physical therapy at [facility] in [place]. Pt recently diagnosed with parkinson's at [facility] neurology and scheduled for MRI next week. Pt accompanied to visit today by his wife.

## 2018-06-13 ENCOUNTER — Other Ambulatory Visit: Payer: Self-pay | Admitting: Family Medicine

## 2018-06-13 DIAGNOSIS — J302 Other seasonal allergic rhinitis: Secondary | ICD-10-CM

## 2018-06-13 DIAGNOSIS — J3089 Other allergic rhinitis: Principal | ICD-10-CM

## 2018-07-01 IMAGING — CR DG CHEST 2V
1 series · 2 of 2 positions shown · non-contrast
Comparison: None.

CLINICAL DATA: Weight loss, former tobacco use

EXAM:
CHEST  2 VIEW

[Series 1: dg chest 2 view · 0.14mm/px · 2 of 2 slices shown]
[im 1/2]
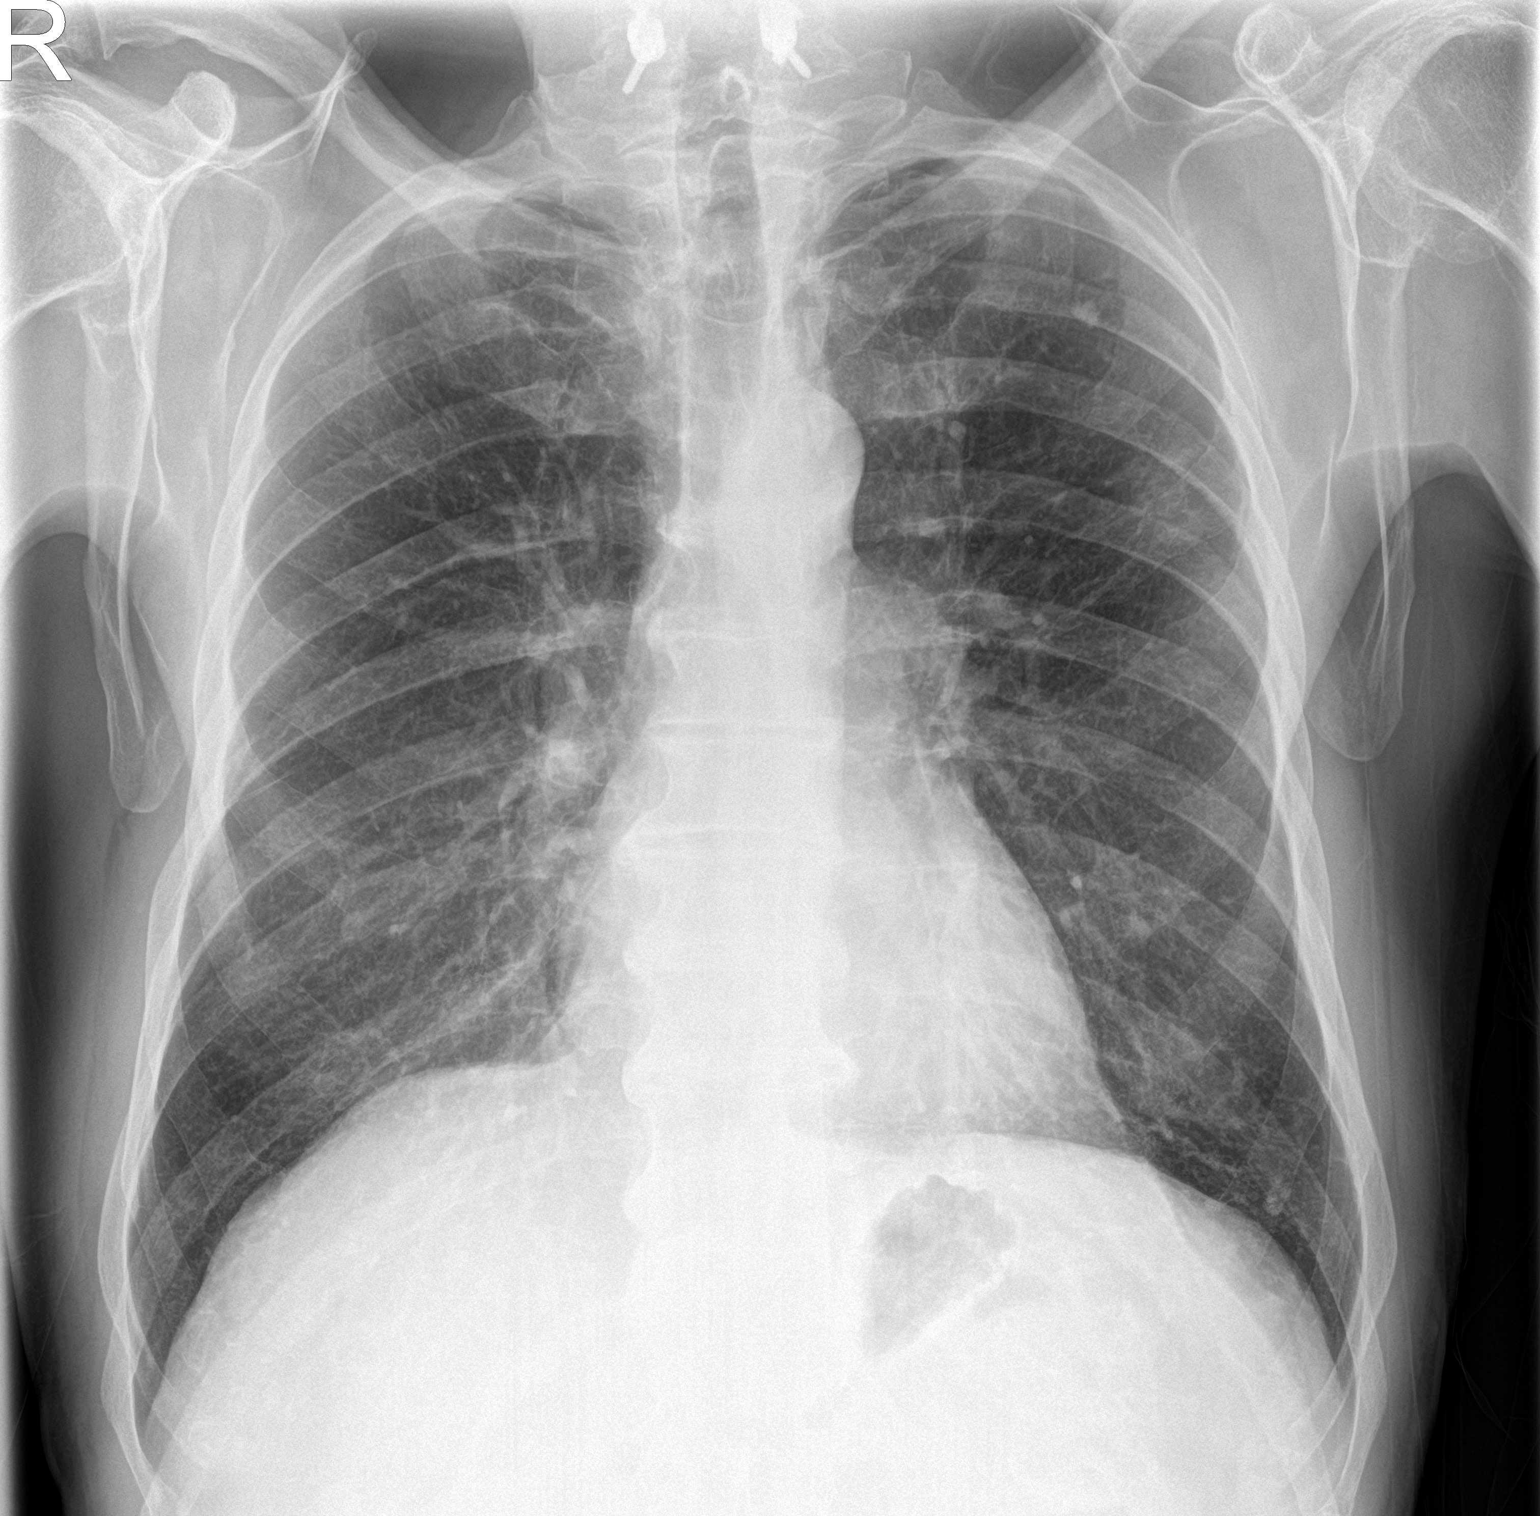
[im 2/2]
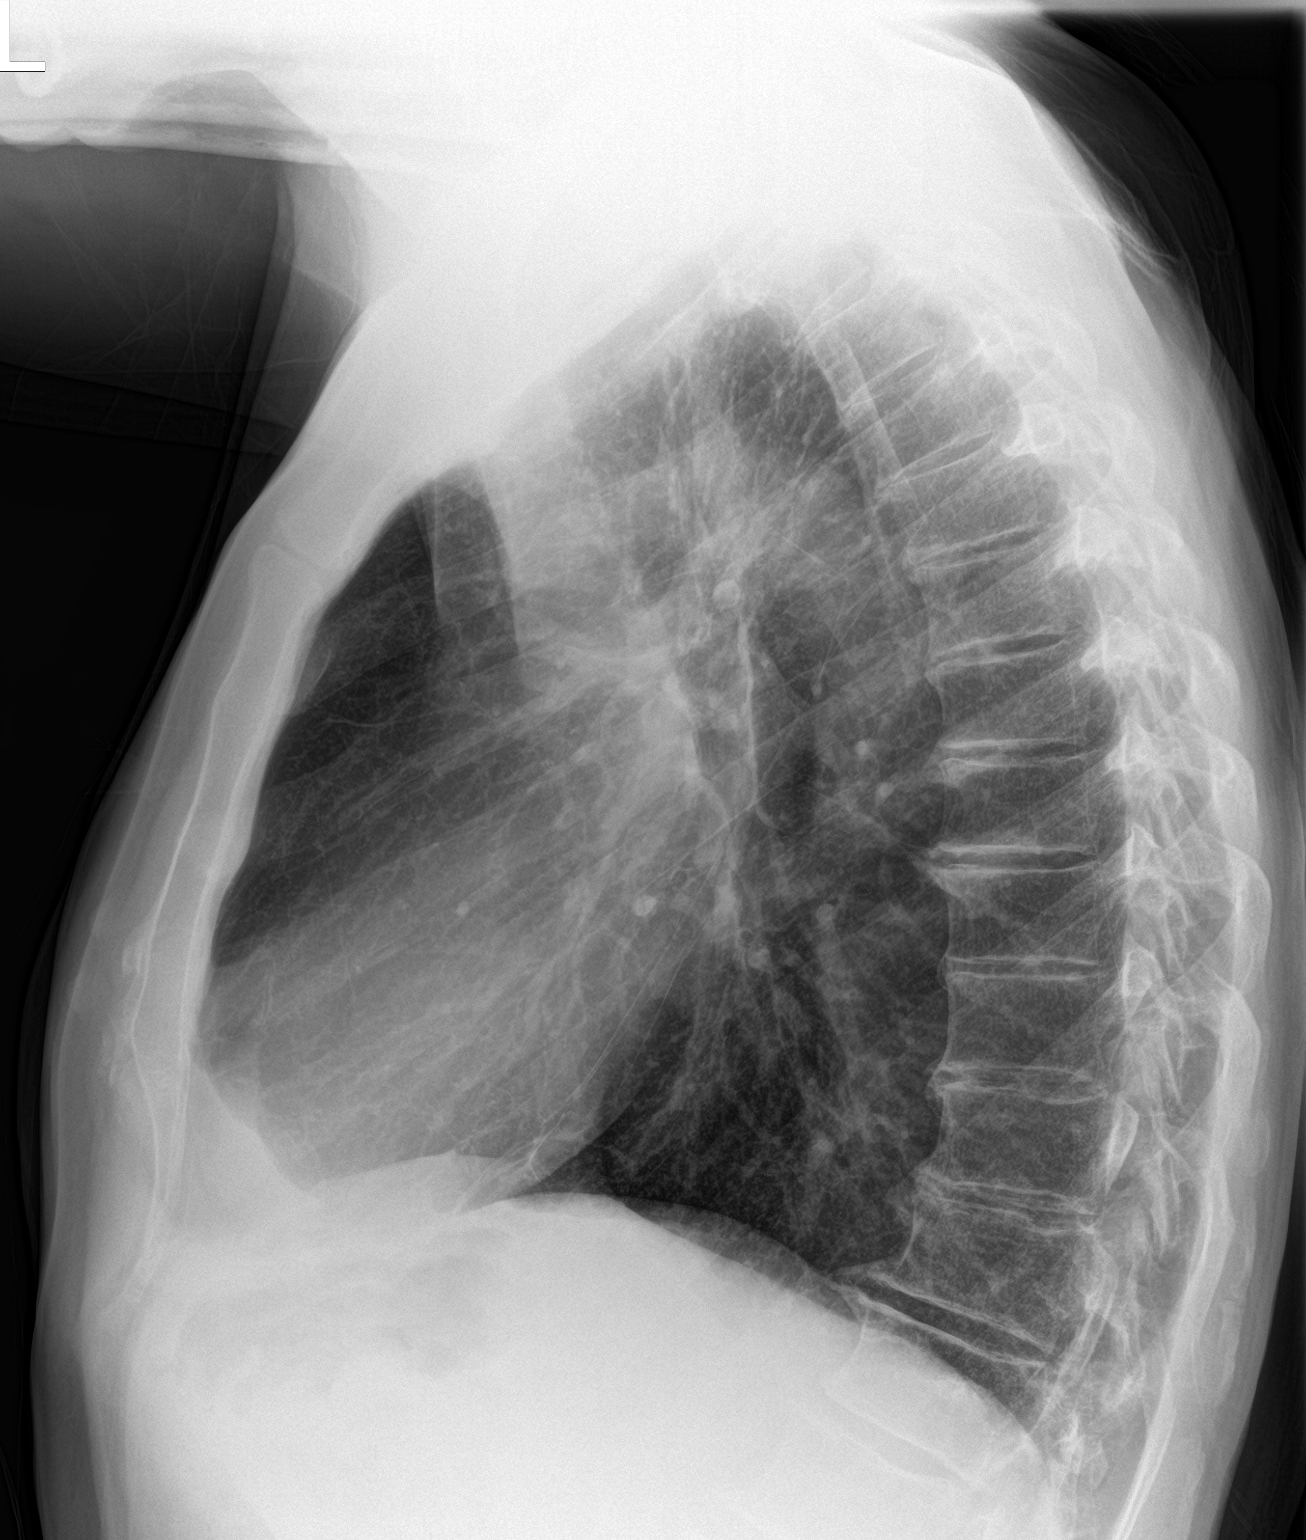

[2 of 2 positions shown; findings below may reference images not displayed]

FINDINGS: There is a calcified left upper lobe pulmonary nodule likely
reflecting sequela prior granulomatous disease. There is no focal
parenchymal opacity. There is no pleural effusion or pneumothorax.
The heart and mediastinal contours are unremarkable.

The osseous structures are unremarkable.
IMPRESSION: No active cardiopulmonary disease.

## 2018-08-15 ENCOUNTER — Other Ambulatory Visit: Payer: Self-pay | Admitting: Family Medicine

## 2018-08-15 DIAGNOSIS — J3089 Other allergic rhinitis: Principal | ICD-10-CM

## 2018-08-15 DIAGNOSIS — J302 Other seasonal allergic rhinitis: Secondary | ICD-10-CM

## 2018-09-01 ENCOUNTER — Other Ambulatory Visit: Payer: Self-pay

## 2018-09-01 ENCOUNTER — Ambulatory Visit (INDEPENDENT_AMBULATORY_CARE_PROVIDER_SITE_OTHER): Payer: Medicare HMO | Admitting: Family Medicine

## 2018-09-01 ENCOUNTER — Encounter: Payer: Self-pay | Admitting: Family Medicine

## 2018-09-01 VITALS — BP 119/58 | HR 57 | Ht 73.0 in | Wt 190.0 lb

## 2018-09-01 DIAGNOSIS — J0101 Acute recurrent maxillary sinusitis: Secondary | ICD-10-CM | POA: Diagnosis not present

## 2018-09-01 MED ORDER — AZITHROMYCIN 200 MG/5ML PO SUSR: 500.0000 mg | Freq: Every day | ORAL | 0 refills | Status: AC

## 2018-09-01 NOTE — Progress Notes (Signed)
Name: Darren Thomas.   MRN: [id number]    DOB: 03-Oct-1945   Date:09/01/2018       Progress Note  Subjective  Chief Complaint  Chief Complaint  Patient presents with  . Sinusitis    Onset several weeks ago. Congestion, cough, decreased appetite due to muscous.    I connected with  Darren Thomas.  on 09/01/18 at 11:40 AM EDT by a video enabled telemedicine application and verified that I am speaking with the correct person using two identifiers.  I discussed the limitations of evaluation and management by telemedicine and the availability of in person appointments. The patient expressed understanding and agreed to proceed. Staff also discussed with the patient that there may be a patient responsible charge related to this service. Patient Location: at home  Provider Location: [facility] Additional Individuals present: his wife   HPI  Sinusitis: he has a history of AR and recurrent sinusitis. He states that for the past 3 weeks , he has noticed increase in nasal congestion and post nasal drainage. He also has some sore throat because of the drainage, but no fever, chills or SOB, only coughs when too much post-nasal drainage. He is taking allergy medications as prescribed.    Patient Active Problem List   Diagnosis Date Noted  . Skin ulcer of right foot, limited to breakdown of skin ([place]) 05/21/2018  . Thrombocytopenia ([place]) 05/21/2018  . Squamous cell carcinoma of left lower leg 01/27/2018  . Major depression, recurrent, chronic ([place]) 10/06/2017  . Malnutrition ([place]) 10/06/2017  . Hammer toe, acquired 06/19/2017  . Chronic constipation 11/07/2016  . Insomnia 06/20/2016  . PTSD (post-traumatic stress disorder) 06/20/2016  . Perennial allergic rhinitis with seasonal variation 02/05/2016  . Cervical spondylosis with myelopathy 01/06/2014    Past Surgical History:  Procedure Laterality Date  . C-spine     C-3-7 removed  . SCALP LESION REMOVAL W/ FLAP AND  SKIN GRAFT    . SKIN CANCER EXCISION Left    squamos cell cancinoma    Family History  Problem Relation Age of Onset  . Cervical cancer Mother   . Heart Problems Father   . Diabetes Father   . Asthma Brother   . Emphysema Brother        was a smoker  . Diabetes type I Son   . Colon cancer Son 46    Social History   Socioeconomic History  . Marital status: Married    Spouse name: Darren Thomas  . Number of children: 2  . 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 Highest education level: 8th grade  Occupational History  . Occupation: retired  Scientific laboratory technician  . Financial resource strain: Not hard at all  . Food insecurity:    Worry: Never true    Inability: Never true  . Transportation needs:    Medical: No    Non-medical: No  Tobacco Use  . Smoking status: Former Smoker    Packs/day: 1.00    Years: 20.00    Pack years: 20.00    Types: Cigarettes    Last attempt to quit: 1973    Years since quitting: 47.3  . Smokeless tobacco: Never Used  . Tobacco comment: smoking cessation materials not required  Substance and Sexual Activity  . Alcohol use: No  . Drug use: No  . Sexual activity: Not Currently  Lifestyle  . Physical activity:    Days per week: 2 days  Minutes per session: 60 min  . Stress: Only a little  Relationships  . Social connections:    Talks on phone: More than three times a week    Gets together: Once a week    Attends religious service: Never    Active member of club or organization: No    Attends meetings of clubs or organizations: Never    Relationship status: Married  . Intimate partner violence:    Fear of current or ex partner: No    Emotionally abused: No    Physically abused: No    Forced sexual activity: No  Other Topics Concern  . Not on file  Social History Narrative  . Not on file     Current Outpatient Medications:  .  buPROPion HCl ER, XL, 450 MG TB24, Take 450 mg by mouth daily. , Disp: , Rfl:  .  carbidopa-levodopa  (SINEMET IR) 25-100 MG tablet, Take 2 tablets by mouth 3 (three) times daily. , Disp: , Rfl:  .  Cyanocobalamin (B-12) 1000 MCG SUBL, Place 1 tablet under the tongue daily., Disp: 30 each, Rfl: 0 .  escitalopram (LEXAPRO) 20 MG tablet, Take 20 mg by mouth daily., Disp: , Rfl: 2 .  fluticasone (FLONASE) 50 MCG/ACT nasal spray, PLACE 2 SPRAYS INTO BOTH NOSTRILS DAILY., Disp: 48 g, Rfl: 0 .  lamoTRIgine (LAMICTAL) 100 MG tablet, 1 tablet daily., Disp: , Rfl:  .  loratadine (CLARITIN) 10 MG tablet, Take 1 tablet (10 mg total) by mouth daily., Disp: 90 tablet, Rfl: 3 .  Multiple Vitamins-Minerals (COMPLETE MULTIVITAMIN/MINERAL PO), Take 1 tablet by mouth daily., Disp: , Rfl:  .  Multiple Vitamins-Minerals (PRESERVISION AREDS 2 PO), Take by mouth., Disp: , Rfl:  .  Omega-3 Fatty Acids (FISH OIL) 1200 MG CAPS, Take 3 capsules by mouth daily., Disp: , Rfl:  .  polyethylene glycol (MIRALAX / GLYCOLAX) packet, Take 17 g by mouth every other day., Disp: , Rfl:  .  traZODone (DESYREL) 50 MG tablet, Take 50-100 mg by mouth at bedtime as needed. for sleep, Disp: , Rfl: 0  Allergies  Allergen Reactions  . Augmentin [Amoxicillin-Pot Clavulanate] Diarrhea    I personally reviewed active problem list, medication list, allergies, family history with the patient/caregiver today.   ROS Ten systems reviewed and is negative except as mentioned in HPI *  Objective  Virtual encounter, vitals not obtained.  Body mass index is 25.07 kg/m.  Physical Exam  Awake, alert and oriented, cheerful, wife next to him He denies pain while applying pressure to his face, however has a dull headache  PHQ2/9: Depression screen [facility] 2/9 09/01/2018 05/21/2018 01/27/2018 10/06/2017 05/15/2017  Decreased Interest 0 1 0 1 1  Down, Depressed, Hopeless 3 2 1 2 1   PHQ - 2 Score 3 3 1 3 2   Altered sleeping 0 1 3 1 1   Tired, decreased energy 0 0 1 1 1   Change in appetite 2 0 0 0 1  Feeling bad or failure about yourself  2 1 0 0 1   Trouble concentrating 0 0 1 0 1  Moving slowly or fidgety/restless 0 1 2 1 1   Suicidal thoughts 0 0 0 1 1  PHQ-9 Score 7 6 8 7 9   Difficult doing work/chores Not difficult at all Somewhat difficult Very difficult Somewhat difficult Somewhat difficult   PHQ-2/9 Result is positive.    Fall Risk: Fall Risk  09/01/2018 05/21/2018 01/27/2018 10/06/2017 08/17/2017  Falls in the past year? 1 1 Yes  No No  Number falls in past yr: 1 1 2  or more - -  Comment - pt states approx 20 falls in the past year over the spring and summer - - -  Injury with Fall? 0 1 Yes - -  Comment - bruising, abrasions - no major injuries - - -  Risk Factor Category  - - High Fall Risk - -  Risk for fall due to : - History of fall(s);Impaired balance/gait History of fall(s);Impaired balance/gait;Impaired mobility - -  Risk for fall due to: Comment - - - - -  Follow up - Falls prevention discussed;Falls evaluation completed - - -     Assessment & Plan  1. Acute recurrent maxillary sinusitis  Also advised to add saline spray and if no improvement he will call back for referral to ENT - azithromycin (ZITHROMAX) 200 MG/5ML suspension; Take 12.5 mLs (500 mg total) by mouth daily for 3 days.  Dispense: 37.5 mL; Refill: 0  I discussed the assessment and treatment plan with the patient. The patient was provided an opportunity to ask questions and all were answered. The patient agreed with the plan and demonstrated an understanding of the instructions.  The patient was advised to call back or seek an in-person evaluation if the symptoms worsen or if the condition fails to improve as anticipated.  I provided 17  minutes of non-face-to-face time during this encounter.

## 2018-10-06 ENCOUNTER — Other Ambulatory Visit: Payer: Self-pay | Admitting: Family Medicine

## 2018-10-06 DIAGNOSIS — J302 Other seasonal allergic rhinitis: Secondary | ICD-10-CM

## 2018-10-06 DIAGNOSIS — J3089 Other allergic rhinitis: Secondary | ICD-10-CM

## 2018-11-19 ENCOUNTER — Ambulatory Visit (INDEPENDENT_AMBULATORY_CARE_PROVIDER_SITE_OTHER): Payer: Medicare HMO | Admitting: Family Medicine

## 2018-11-19 ENCOUNTER — Encounter: Payer: Self-pay | Admitting: Family Medicine

## 2018-11-19 ENCOUNTER — Other Ambulatory Visit: Payer: Self-pay

## 2018-11-19 VITALS — BP 118/59 | HR 56 | Temp 96.9°F | Wt 173.6 lb

## 2018-11-19 DIAGNOSIS — F339 Major depressive disorder, recurrent, unspecified: Secondary | ICD-10-CM | POA: Diagnosis not present

## 2018-11-19 DIAGNOSIS — G20A1 Parkinson's disease without dyskinesia, without mention of fluctuations: Secondary | ICD-10-CM | POA: Insufficient documentation

## 2018-11-19 DIAGNOSIS — R1311 Dysphagia, oral phase: Secondary | ICD-10-CM | POA: Diagnosis not present

## 2018-11-19 DIAGNOSIS — J328 Other chronic sinusitis: Secondary | ICD-10-CM | POA: Diagnosis not present

## 2018-11-19 DIAGNOSIS — G20 Parkinson's disease: Secondary | ICD-10-CM

## 2018-11-19 MED ORDER — MOXIFLOXACIN HCL 400 MG PO TABS: 400.0000 mg | ORAL_TABLET | Freq: Every day | ORAL | 0 refills | Status: DC

## 2018-11-19 NOTE — Progress Notes (Signed)
Name: Darren Thomas.   MRN: [id number]    DOB: [birth year]   Date:11/19/2018       Progress Note  Subjective  Chief Complaint  No chief complaint on file.   I connected with  [name].  on 11/19/18 at 11:20 AM EDT by a video enabled telemedicine application and verified that I am speaking with the correct person using two identifiers.  I discussed the limitations of evaluation and management by telemedicine and the availability of in person appointments. The patient expressed understanding and agreed to proceed. Staff also discussed with the patient that there may be a patient responsible charge related to this service. Patient Location: at home Provider Location: [place] Wife was present    HPI  Post-nasal drainage: he was treated for tripack two months ago, states continues to have post-nasal drainage, nausea from swallowing a lot of mucus and gets nauseated, lack of appetite, and has lost weight. No facial pressure . He goes to the [place], but has seen Dr. Pryor Ochoa in the past. He is currently taking allergy medication, mucinex and nasal spray without help.   Parkinson's disease: under the care of neurologist through the [place], recently added Aricept because he is also getting very forgetful.   [facility] , he is a little isolated because of COVID-19 but stable  Dysphagia; she has dysmotility it may be from parkinson's  Patient Active Problem List   Diagnosis Date Noted  . Skin ulcer of right foot, limited to breakdown of skin ([place]) 05/21/2018  . Thrombocytopenia ([place]) 05/21/2018  . Squamous cell carcinoma of left lower leg 01/27/2018  . Major depression, recurrent, chronic ([place]) 10/06/2017  . Malnutrition ([place]) 10/06/2017  . Hammer toe, acquired 06/19/2017  . Chronic constipation 11/07/2016  . Insomnia 06/20/2016  . PTSD (post-traumatic stress disorder) 06/20/2016  . Perennial allergic rhinitis with seasonal variation 02/05/2016  .  Cervical spondylosis with myelopathy 01/06/2014    Past Surgical History:  Procedure Laterality Date  . C-spine     C-3-7 removed  . SCALP LESION REMOVAL W/ FLAP AND SKIN GRAFT    . SKIN CANCER EXCISION Left    squamos cell cancinoma    Family History  Problem Relation Age of Onset  . Cervical cancer Mother   . Heart Problems Father   . Diabetes Father   . Asthma Brother   . Emphysema Brother        was a smoker  . Diabetes type I Son   . Colon cancer Son 45    Social History   Socioeconomic History  . Marital status: Married    Spouse name: Derl Barrow  . Number of children: 2  . 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 Highest education level: 8th grade  Occupational History  . Occupation: retired  Scientific laboratory technician  . Financial resource strain: Not hard at all  . Food insecurity    Worry: Never true    Inability: Never true  . Transportation needs    Medical: No    Non-medical: No  Tobacco Use  . Smoking status: Former Smoker    Packs/day: 1.00    Years: 20.00    Pack years: 20.00    Types: Cigarettes    Quit date: 1973    Years since quitting: 47.5  . Smokeless tobacco: Never Used  . Tobacco comment: smoking cessation materials not required  Substance and Sexual Activity  . Alcohol use: No  .  Drug use: No  . Sexual activity: Not Currently  Lifestyle  . Physical activity    Days per week: 2 days    Minutes per session: 60 min  . Stress: Only a little  Relationships  . Social connections    Talks on phone: More than three times a week    Gets together: Once a week    Attends religious service: Never    Active member of club or organization: No    Attends meetings of clubs or organizations: Never    Relationship status: Married  . Intimate partner violence    Fear of current or ex partner: No    Emotionally abused: No    Physically abused: No    Forced sexual activity: No  Other Topics Concern  . Not on file  Social History Narrative  . Not on  file     Current Outpatient Medications:  .  buPROPion HCl ER, XL, 450 MG TB24, Take 450 mg by mouth daily. , Disp: , Rfl:  .  escitalopram (LEXAPRO) 20 MG tablet, Take 20 mg by mouth daily., Disp: , Rfl: 2 .  fluticasone (FLONASE) 50 MCG/ACT nasal spray, USE 2 SPRAYS IN EACH NOSTRIL EVERY DAY, Disp: 48 g, Rfl: 5 .  lamoTRIgine (LAMICTAL) 100 MG tablet, 1 tablet daily., Disp: , Rfl:  .  Multiple Vitamins-Minerals (COMPLETE MULTIVITAMIN/MINERAL PO), Take 1 tablet by mouth daily., Disp: , Rfl:  .  Multiple Vitamins-Minerals (PRESERVISION AREDS 2 PO), Take by mouth., Disp: , Rfl:  .  Omega-3 Fatty Acids (FISH OIL) 1200 MG CAPS, Take 1 capsule by mouth daily. , Disp: , Rfl:  .  polyethylene glycol (MIRALAX / GLYCOLAX) packet, Take 17 g by mouth every other day., Disp: , Rfl:  .  traZODone (DESYREL) 50 MG tablet, Take 50-100 mg by mouth at bedtime as needed. for sleep, Disp: , Rfl: 0 .  carbidopa-levodopa (SINEMET IR) 25-100 MG tablet, Take 3 tablets by mouth 3 (three) times daily. , Disp: , Rfl:  .  Cyanocobalamin (B-12) 1000 MCG SUBL, Place 1 tablet under the tongue daily. (Patient not taking: Reported on 11/19/2018), Disp: 30 each, Rfl: 0 .  loratadine (CLARITIN) 10 MG tablet, Take 1 tablet (10 mg total) by mouth daily. (Patient not taking: Reported on 11/19/2018), Disp: 90 tablet, Rfl: 3  Allergies  Allergen Reactions  . Augmentin [Amoxicillin-Pot Clavulanate] Diarrhea    I personally reviewed active problem list, medication list, allergies, family history, social history with the patient/caregiver today.   ROS  Ten systems reviewed and is negative except as mentioned in HPI   Objective  Virtual encounter, vitals not obtained.  Body mass index is 22.9 kg/m.  Physical Exam  Awake, alert and oriented  PHQ2/9: Depression screen [facility] 2/9 11/19/2018 09/01/2018 05/21/2018 01/27/2018 10/06/2017  Decreased Interest 1 0 1 0 1  Down, Depressed, Hopeless 1 3 2 1 2   PHQ - 2 Score 2 3 3 1 3    Altered sleeping 1 0 1 3 1   Tired, decreased energy - 0 0 1 1  Change in appetite 3 2 0 0 0  Feeling bad or failure about yourself  1 2 1  0 0  Trouble concentrating 1 0 0 1 0  Moving slowly or fidgety/restless 1 0 1 2 1   Suicidal thoughts 0 0 0 0 1  PHQ-9 Score 9 7 6 8 7   Difficult doing work/chores Somewhat difficult Not difficult at all Somewhat difficult Very difficult Somewhat difficult   PHQ-2/9 Result  is positive.    Fall Risk: Fall Risk  09/01/2018 05/21/2018 01/27/2018 10/06/2017 08/17/2017  Falls in the past year? 1 1 Yes No No  Number falls in past yr: 1 1 2  or more - -  Comment - pt states approx 20 falls in the past year over the spring and summer - - -  Injury with Fall? 0 1 Yes - -  Comment - bruising, abrasions - no major injuries - - -  Risk Factor Category  - - High Fall Risk - -  Risk for fall due to : - History of fall(s);Impaired balance/gait History of fall(s);Impaired balance/gait;Impaired mobility - -  Risk for fall due to: Comment - - - - -  Follow up - Falls prevention discussed;Falls evaluation completed - - -     Assessment & Plan  1. Parkinson disease ([place])  Keep follow up with neurologist   2. Other chronic sinusitis  - moxifloxacin (AVELOX) 400 MG tablet; Take 1 tablet (400 mg total) by mouth daily.  Dispense: 10 tablet; Refill: 0 - Ambulatory referral to ENT  3. Major depression, recurrent, chronic ([facility])  Taking medication  4. Oral phase dysphagia  Reviewed the test done in June. Explained likely from parkinson's   I discussed the assessment and treatment plan with the patient. The patient was provided an opportunity to ask questions and all were answered. The patient agreed with the plan and demonstrated an understanding of the instructions.  The patient was advised to call back or seek an in-person evaluation if the symptoms worsen or if the condition fails to improve as anticipated.  I provided 25  minutes of non-face-to-face time during  this encounter.

## 2018-12-02 DIAGNOSIS — R682 Dry mouth, unspecified: Secondary | ICD-10-CM | POA: Diagnosis not present

## 2018-12-02 DIAGNOSIS — R1314 Dysphagia, pharyngoesophageal phase: Secondary | ICD-10-CM | POA: Diagnosis not present

## 2018-12-16 ENCOUNTER — Encounter: Payer: Self-pay | Admitting: General Surgery

## 2019-01-12 ENCOUNTER — Telehealth: Payer: Self-pay | Admitting: Family Medicine

## 2019-01-12 NOTE — Chronic Care Management (AMB) (Signed)
Chronic Care Management   Note  01/12/2019 Name: Kolden Dupee. MRN: [id number] DOB: 08-02-1945  Shaheen Mende. is a 73 y.o. year old male who is a primary care patient of Steele Sizer, MD. I reached out to [name]. by phone today in response to a referral sent by Mr. DENNIS HEGEMAN Jr.'s health plan.    Mr. Lakey was given information about Chronic Care Management services today including:  1. CCM service includes personalized support from designated clinical staff supervised by his physician, including individualized plan of care and coordination with other care providers 2. 24/7 contact phone numbers for assistance for urgent and routine care needs. 3. Service will only be billed when office clinical staff spend 20 minutes or more in a month to coordinate care. 4. Only one practitioner may furnish and bill the service in a calendar month. 5. The patient may stop CCM services at any time (effective at the end of the month) by phone call to the office staff. 6. The patient will be responsible for cost sharing (co-pay) of up to 20% of the service fee (after annual deductible is met).  Patient agreed to services and verbal consent obtained.   Follow up plan: Telephone appointment with CCM team member scheduled for: 01/13/2019  [place]  [email or web address]   ??[phone]

## 2019-01-13 ENCOUNTER — Encounter: Payer: Self-pay | Admitting: *Deleted

## 2019-01-13 ENCOUNTER — Ambulatory Visit (INDEPENDENT_AMBULATORY_CARE_PROVIDER_SITE_OTHER): Payer: Medicare HMO | Admitting: *Deleted

## 2019-01-13 DIAGNOSIS — F339 Major depressive disorder, recurrent, unspecified: Secondary | ICD-10-CM

## 2019-01-13 DIAGNOSIS — G20 Parkinson's disease: Secondary | ICD-10-CM

## 2019-01-13 NOTE — Patient Instructions (Signed)
Thank you allowing the Chronic Care Management Team to be a part of your care! It was a pleasure speaking with you today!  1. Please feel free to call this social worker if there are any community resource needs that may arise in the future.  CCM (Chronic Care Management) Team   E. Thressa Sheller RN, BSN Nurse Care Coordinator  [phone]  Ruben Reason PharmD  Clinical Pharmacist  [phone]   [place], LCSW Clinical Social Worker [phone]  Goals Addressed   None      The patient verbalized understanding of instructions provided today and declined a print copy of patient instruction materials.   No further follow up required: Patient to call this social worker with any community resource needs that may arise in the future

## 2019-01-13 NOTE — Chronic Care Management (AMB) (Signed)
ccm 

## 2019-01-13 NOTE — Chronic Care Management (AMB) (Signed)
  Chronic Care Management    Clinical Social Work General Note  01/13/2019 Name: Darren Thomas. MRN: [id number] DOB: February 18, 1946  Darren Thomas. is a 73 y.o. year old male who is a primary care patient of Steele Sizer, MD. Patient was referred by his insurance company for [employer].  Review of patient status, including review of consultants reports, relevant laboratory and other test results, and collaboration with appropriate care team members and the patient's provider was performed as part of comprehensive patient evaluation and provision of chronic care management services.    SDOH (Social Determinants of Health) screening performed today. See Care Plan Entry related to challenges with: Depression   Physical Activity   This social worker spoke to patient and his spouse. CCM services re-introduced. Per patient, he is a English as a second language teacher and is followed by the [employer] as well. Patient reports receiving the majority of his medication through the [place] and is working with PT to obtain a wheelchair due to frequent falls. PT virtual appointment scheduled for next week.  Patient has a cain and a walker currently.  Patient reports being followed by psychiatrist Benay Pillow, MD for his psychotropic medication and agrees to consider follow up with a therapist through the [facility] for mental health counseling as a additional resource if needed.  Per patient and his spouse, they felt that they were well connected between his providers office and the [place] and verbalized having no community resource needs at this time.   Outpatient Encounter Medications as of 01/13/2019  Medication Sig Note  . buPROPion HCl ER, XL, 450 MG TB24 Take 450 mg by mouth daily.    . carbidopa-levodopa (SINEMET IR) 25-100 MG tablet Take 3 tablets by mouth 3 (three) times daily.    . Cyanocobalamin (B-12) 1000 MCG SUBL Place 1 tablet under the tongue daily. (Patient not taking: Reported on 11/19/2018)   . donepezil (ARICEPT) 10 MG  tablet Take 10 mg by mouth at bedtime.   Marland Kitchen escitalopram (LEXAPRO) 20 MG tablet Take 20 mg by mouth daily.   . fluticasone (FLONASE) 50 MCG/ACT nasal spray USE 2 SPRAYS IN EACH NOSTRIL EVERY DAY   . lamoTRIgine (LAMICTAL) 100 MG tablet 1 tablet daily.   Marland Kitchen loratadine (CLARITIN) 10 MG tablet Take 1 tablet (10 mg total) by mouth daily. (Patient not taking: Reported on 11/19/2018)   . moxifloxacin (AVELOX) 400 MG tablet Take 1 tablet (400 mg total) by mouth daily.   . Multiple Vitamins-Minerals (COMPLETE MULTIVITAMIN/MINERAL PO) Take 1 tablet by mouth daily. 01/06/2014: Received from: External Pharmacy Received Sig:   . Multiple Vitamins-Minerals (PRESERVISION AREDS 2 PO) Take by mouth.   . Omega-3 Fatty Acids (FISH OIL) 1200 MG CAPS Take 1 capsule by mouth daily.    . polyethylene glycol (MIRALAX / GLYCOLAX) packet Take 17 g by mouth every other day.   . traZODone (DESYREL) 50 MG tablet Take 50-100 mg by mouth at bedtime as needed. for sleep    No facility-administered encounter medications on file as of 01/13/2019.     Goals Addressed   None      Follow Up Plan: Client will contact the CCM if there are any community resource needs in the future. This social worker's contact information provided       [employer], [place] Administrator, [facility] Care Management [phone]

## 2019-04-12 DIAGNOSIS — C801 Malignant (primary) neoplasm, unspecified: Secondary | ICD-10-CM

## 2019-04-12 HISTORY — DX: Malignant (primary) neoplasm, unspecified: C80.1

## 2019-05-27 ENCOUNTER — Ambulatory Visit (INDEPENDENT_AMBULATORY_CARE_PROVIDER_SITE_OTHER): Payer: Medicare HMO

## 2019-05-27 VITALS — BP 104/57 | HR 74 | Temp 97.3°F | Ht 73.0 in | Wt 168.8 lb

## 2019-05-27 DIAGNOSIS — Z0000 Encounter for general adult medical examination without abnormal findings: Secondary | ICD-10-CM

## 2019-05-27 NOTE — Progress Notes (Addendum)
Subjective:   Darren Thomas. is a 74 y.o. male who presents for Medicare Annual/Subsequent preventive examination.  Virtual Visit via Telephone Note  I connected with Darren Thomas. on 05/27/19 at 10:40 AM EST by telephone and verified that I am speaking with the correct person using two identifiers.  Medicare Annual Wellness visit completed telephonically due to Covid-19 pandemic.   Location: Patient: home Provider: office   I discussed the limitations, risks, security and privacy concerns of performing an evaluation and management service by telephone and the availability of in person appointments. The patient expressed understanding and agreed to proceed.  Some vital signs may be absent or patient reported.   Darren Marker, Thomas    Review of Systems:   Cardiac Risk Factors include: advanced age (>74men, >6 women);male gender;sedentary lifestyle     Objective:    Vitals: BP (!) 104/57   Pulse 74   Ht 6\' 1"  (1.854 m)   Wt 168 lb 12.8 oz (76.6 kg)   BMI 22.27 kg/m   Body mass index is 22.27 kg/m.  Advanced Directives 05/27/2019 05/21/2018 05/15/2017 11/07/2016 02/05/2016  Does Patient Have a Medical Advance Directive? Yes No Yes No No  Type of Paramedic of [place];Living will - [place];Living will - -  Copy of [place] in Chart? No - copy requested - No - copy requested - -  Would patient like information on creating a medical advance directive? - (No Data) - - -    Tobacco Social History   Tobacco Use  Smoking Status Former Smoker  . Packs/day: 1.00  . Years: 20.00  . Pack years: 20.00  . Types: Cigarettes  . Quit date: 60  . Years since quitting: 48.0  Smokeless Tobacco Never Used  Tobacco Comment   smoking cessation materials not required     Counseling given: Not Answered Comment: smoking cessation materials not required   Clinical Intake:  Pre-visit preparation completed: Yes   Pain : No/denies pain     BMI - recorded: 22.27 Nutritional Status: BMI of 19-24  Normal Nutritional Risks: None Diabetes: No  How often do you need to have someone help you when you read instructions, pamphlets, or other written materials from your doctor or pharmacy?: 1 - Never  Interpreter Needed?: No  Information entered by :: Darren Thomas  Past Medical History:  Diagnosis Date  . Abnormality of gait   . Back pain   . Cancer ([place]) 04/2019   stage IV lung cancer being treated at [facility], left hip muscle, stomach, top of small intesting, liver and lymphnode near lungs  . Cervical spondylosis with myelopathy 01/06/2014  . Depression   . Insomnia   . Parkinson disease ([place]) 04/2018   diagnosed by neurology at [facility]  . PTSD (post-traumatic stress disorder)   . PTSD (post-traumatic stress disorder)    Past Surgical History:  Procedure Laterality Date  . C-spine     C-3-7 removed  . SCALP LESION REMOVAL W/ FLAP AND SKIN GRAFT    . SKIN CANCER EXCISION Left    squamos cell cancinoma   Family History  Problem Relation Age of Onset  . Cervical cancer Mother   . Heart Problems Father   . Diabetes Father   . Asthma Brother   . Emphysema Brother        was a smoker  . Diabetes type I Son   . Colon cancer Son 74  Social History   Socioeconomic History  . Marital status: Married    Spouse name: Darren Thomas  . Number of children: 2  . 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 Highest education level: 8th grade  Occupational History  . Occupation: retired  Tobacco Use  . Smoking status: Former Smoker    Packs/day: 1.00    Years: 20.00    Pack years: 20.00    Types: Cigarettes    Quit date: 1973    Years since quitting: 48.0  . Smokeless tobacco: Never Used  . Tobacco comment: smoking cessation materials not required  Substance and Sexual Activity  . Alcohol use: No  . Drug use: No  . Sexual activity: Not Currently  Other Topics Concern  . Not on file   Social History Narrative  . Not on file   Social Determinants of Health   Financial Resource Strain: Low Risk   . Difficulty of Paying Living Expenses: Not hard at all  Food Insecurity: No Food Insecurity  . Worried About Charity fundraiser in the Last Year: Never true  . Ran Out of Food in the Last Year: Never true  Transportation Needs: No Transportation Needs  . Lack of Transportation (Medical): No  . Lack of Transportation (Non-Medical): No  Physical Activity: Inactive  . Days of Exercise per Week: 0 days  . Minutes of Exercise per Session: 0 min  Stress: No Stress Concern Present  . Feeling of Stress : Only a little  Social Connections: Somewhat Isolated  . Frequency of Communication with Friends and Family: More than three times a week  . Frequency of Social Gatherings with Friends and Family: Once a week  . Attends Religious Services: Never  . Active Member of Clubs or Organizations: No  . Attends Archivist Meetings: Never  . Marital Status: Married    Outpatient Encounter Medications as of 05/27/2019  Medication Sig  . buPROPion HCl ER, XL, 450 MG TB24 Take 450 mg by mouth daily. Pt taking 300mg  daily  . carbidopa-levodopa (SINEMET IR) 25-100 MG tablet Take 3 tablets by mouth 3 (three) times daily.   . cyproheptadine (PERIACTIN) 4 MG tablet Take 4 mg by mouth daily.   Marland Kitchen donepezil (ARICEPT) 10 MG tablet Take 10 mg by mouth at bedtime.  Marland Kitchen escitalopram (LEXAPRO) 20 MG tablet Take 20 mg by mouth daily.  . fluticasone (FLONASE) 50 MCG/ACT nasal spray USE 2 SPRAYS IN EACH NOSTRIL EVERY DAY  . lamoTRIgine (LAMICTAL) 100 MG tablet 1 tablet daily.  . Multiple Vitamins-Minerals (COMPLETE MULTIVITAMIN/MINERAL PO) Take 1 tablet by mouth daily.  . Multiple Vitamins-Minerals (PRESERVISION AREDS 2 PO) Take by mouth.  . pembrolizumab (KEYTRUDA) 100 MG/4ML SOLN Inject 2 mg/kg into the vein. Immun[facility] treatments every 6 weeks at [facility]  . polyethylene glycol (MIRALAX /  GLYCOLAX) packet Take 17 g by mouth every other day.  . traZODone (DESYREL) 100 MG tablet Take 1 tablet by mouth at bedtime.  . [DISCONTINUED] buPROPion (WELLBUTRIN XL) 150 MG 24 hr tablet Take 1 tablet by mouth daily.  . [DISCONTINUED] Cyanocobalamin (B-12) 1000 MCG SUBL Place 1 tablet under the tongue daily. (Patient not taking: Reported on 11/19/2018)  . [DISCONTINUED] loratadine (CLARITIN) 10 MG tablet Take 1 tablet (10 mg total) by mouth daily. (Patient not taking: Reported on 11/19/2018)  . [DISCONTINUED] moxifloxacin (AVELOX) 400 MG tablet Take 1 tablet (400 mg total) by mouth daily.  . [DISCONTINUED] Omega-3 Fatty Acids (FISH OIL) 1200 MG CAPS  Take 1 capsule by mouth daily.   . [DISCONTINUED] traZODone (DESYREL) 50 MG tablet Take 50-100 mg by mouth at bedtime as needed. for sleep  . [DISCONTINUED] XYLIMELTS 550 MG DISK TAKE 1 TABLET BY MOUTH EVERY 8 HOURS AS NEEDED FOR DRY MOUTH   No facility-administered encounter medications on file as of 05/27/2019.    Activities of Daily Living In your present state of health, do you have any difficulty performing the following activities: 05/27/2019 11/19/2018  Hearing? Tempie Donning  Comment has hearing aids but doesn't wear them often -  Vision? N Y  Difficulty concentrating or making decisions? Tempie Donning  Walking or climbing stairs? Y Y  Dressing or bathing? Tempie Donning  Comment requires assistance dressing at times -  Doing errands, shopping? Tempie Donning  Preparing Food and eating ? N -  Using the Toilet? N -  In the past six months, have you accidently leaked urine? Y -  Comment pt c/o urine dribble at times -  Do you have problems with loss of bowel control? N -  Managing your Medications? Y -  Comment managed by his wife -  Managing your Finances? Y -  Housekeeping or managing your Housekeeping? Y -  Some recent data might be hidden    Patient Care Team: Steele Sizer, MD as PCP - General (Family Medicine) Vern Claude, Bowler as Branch  Management   Assessment:   This is a routine wellness examination for [place].  Exercise Activities and Dietary recommendations Current Exercise Habits: The patient does not participate in regular exercise at present, Exercise limited by: neurologic condition(s);orthopedic condition(s)  Goals    . DIET - INCREASE WATER INTAKE     Recommend to drink at least 6-8 8oz glasses of water per day    . Prevent falls     Pt would like to prevent falls with stable gait        Fall Risk Fall Risk  05/27/2019 09/01/2018 05/21/2018 01/27/2018 10/06/2017  Falls in the past year? 1 1 1  Yes No  Number falls in past yr: 1 1 1 2  or more -  Comment - - pt states approx 20 falls in the past year over the spring and summer - -  Injury with Fall? 0 0 1 Yes -  Comment - - bruising, abrasions - no major injuries - -  Risk Factor Category  - - - High Fall Risk -  Risk for fall due to : Impaired balance/gait;History of fall(s);Impaired mobility - History of fall(s);Impaired balance/gait History of fall(s);Impaired balance/gait;Impaired mobility -  Risk for fall due to: Comment - - - - -  Follow up Falls prevention discussed - Falls prevention discussed;Falls evaluation completed - -   FALL RISK PREVENTION PERTAINING TO THE HOME:  Any stairs in or around the home? Yes  If so, do they handrails? Yes   Home free of loose throw rugs in walkways, pet beds, electrical cords, etc? Yes  Adequate lighting in your home to reduce risk of falls? Yes   ASSISTIVE DEVICES UTILIZED TO PREVENT FALLS:  Life alert? No  Use of a cane, walker or w/c? Yes  Grab bars in the bathroom? Yes  Shower chair or bench in shower? Yes  Elevated toilet seat or a handicapped toilet? Yes   DME ORDERS:  DME order needed?  No   TIMED UP AND GO:  Was the test performed? No . Telephonic visit.   Education: Fall risk prevention has  been discussed.  Intervention(s) required? No    Depression Screen PHQ 2/9 Scores 05/27/2019 01/13/2019  11/19/2018 09/01/2018  PHQ - 2 Score 3 2 2 3   PHQ- 9 Score 11 12 9 7     Cognitive Function     6CIT Screen 05/27/2019 05/21/2018 05/15/2017  What Year? 0 points 0 points 0 points  What month? 0 points 0 points 0 points  What time? 0 points 0 points 0 points  Count back from 20 0 points 0 points 0 points  Months in reverse 0 points 2 points 0 points  Repeat phrase 0 points 4 points 6 points  Total Score 0 6 6    Immunization History  Administered Date(s) Administered  . Influenza, High Dose Seasonal PF 02/05/2016, 01/27/2018  . Influenza,inj,quad, With Preservative 02/09/2017  . Influenza-Unspecified 02/27/2014, 02/09/2017, 01/26/2019  . Pneumococcal Conjugate-13 06/12/2014, 11/07/2016  . Pneumococcal Polysaccharide-23 06/12/2008, 02/20/2010  . Tdap 04/16/2011  . Zoster 02/09/2013  . Zoster Recombinat (Shingrix) 06/26/2017, 11/04/2017    Qualifies for Shingles Vaccine? Yes  Shingrix series completed.   Tdap: Up to date  Flu Vaccine: Up to date  Pneumococcal Vaccine: Up to date   Screening Tests Health Maintenance  Topic Date Due  . COLONOSCOPY  08/26/2020  . TETANUS/TDAP  04/15/2021  . INFLUENZA VACCINE  Completed  . Hepatitis C Screening  Completed  . PNA vac Low Risk Adult  Completed   Cancer Screenings:  Colorectal Screening: Completed 08/27/10. Repeat every 10 years; Pt is following up with GI at [facility].  Lung Cancer Screening: (Low Dose CT Chest recommended if Age 65-80 years, 30 pack-year currently smoking OR have quit w/in 15years.) does not qualify. Pt currently diagnosed with stage IV lung cancer.    Additional Screening:  Hepatitis C Screening: does qualify; Completed 03/31/12.  Vision Screening: Recommended annual ophthalmology exams for early detection of glaucoma and other disorders of the eye. Is the patient up to date with their annual eye exam?  Yes  Who is the provider or what is the name of the office in which the pt attends annual eye exams?  [place]  Dental Screening: Recommended annual dental exams for proper oral hygiene  Community Resource Referral:  CRR required this visit?  No       Plan:    I have personally reviewed and addressed the Medicare Annual Wellness questionnaire and have noted the following in the patient's chart:  A. Medical and social history B. Use of alcohol, tobacco or illicit drugs  C. Current medications and supplements D. Functional ability and status E.  Nutritional status F.  Physical activity G. Advance directives H. List of other physicians I.  Hospitalizations, surgeries, and ER visits in previous 12 months J.  [place] such as hearing and vision if needed, cognitive and depression L. Referrals and appointments   In addition, I have reviewed and discussed with patient certain preventive protocols, quality metrics, and best practice recommendations. A written personalized care plan for preventive services as well as general preventive health recommendations were provided to patient.   Signed,  Darren Marker, Thomas Nurse Health Advisor   Nurse Notes: Pt accompanied during visit today by his wife, Darren Thomas.   Pt recently diagnosed with stage IV lung cancer and being managed by the [facility], currently undergoing immunotherapy every 6 weeks. Multiple cancerous spots noted on CT scan per patient's wife[facility] He is also seeing specialist there for neurology - parkinson's, occupational therapy, palliative care  and GI - blood in stool, decreased appetite. Pt's biggest complaint is the mucous he feels he is unable to clear in his throat and difficulty swallowing. He was previously scheduled for an endoscopy but it was canceled by GI. Pt denies cough or SOB. PHQ9 - 11 today but feels stable, mostly frustrated with current condition but very polite and appreciative of visit today.

## 2019-05-27 NOTE — Patient Instructions (Signed)
Darren Thomas , Thank you for taking time to come for your Medicare Wellness Visit. I appreciate your ongoing commitment to your health goals. Please review the following plan we discussed and let me know if I can assist you in the future.   Screening recommendations/referrals: Colonoscopy: no longer required Recommended yearly ophthalmology/optometry visit for glaucoma screening and checkup Recommended yearly dental visit for hygiene and checkup  Vaccinations: Influenza vaccine: done 01/26/19 Pneumococcal vaccine: done 11/07/16 Tdap vaccine: done 04/16/11 Shingles vaccine: Shingrix completed 11/04/17    Advanced directives: Please bring a copy of your health care power of attorney and living will to the office at your convenience.  Conditions/risks identified: Recommend continuing occupational and physical therapy exercises  Next appointment: Please follow up in one year for your Medicare Annual Wellness visit.    Preventive Care 76 Years and Older, Male Preventive care refers to lifestyle choices and visits with your health care provider that can promote health and wellness. What does preventive care include?  A yearly physical exam. This is also called an annual well check.  Dental exams once or twice a year.  Routine eye exams. Ask your health care provider how often you should have your eyes checked.  Personal lifestyle choices, including:  Daily care of your teeth and gums.  Regular physical activity.  Eating a healthy diet.  Avoiding tobacco and drug use.  Limiting alcohol use.  Practicing safe sex.  Taking low doses of aspirin every day.  Taking vitamin and mineral supplements as recommended by your health care provider. What happens during an annual well check? The services and screenings done by your health care provider during your annual well check will depend on your age, overall health, lifestyle risk factors, and family history of disease. Counseling  Your  health care provider may ask you questions about your:  Alcohol use.  Tobacco use.  Drug use.  Emotional well-being.  Home and relationship well-being.  Sexual activity.  Eating habits.  History of falls.  Memory and ability to understand (cognition).  Work and work Statistician. Screening  You may have the following tests or measurements:  Height, weight, and BMI.  Blood pressure.  Lipid and cholesterol levels. These may be checked every 5 years, or more frequently if you are over 41 years old.  Skin check.  Lung cancer screening. You may have this screening every year starting at age 76 if you have a 30-pack-year history of smoking and currently smoke or have quit within the past 15 years.  Fecal occult blood test (FOBT) of the stool. You may have this test every year starting at age 76.  Flexible sigmoidoscopy or colonoscopy. You may have a sigmoidoscopy every 5 years or a colonoscopy every 10 years starting at age 76.  Prostate cancer screening. Recommendations will vary depending on your family history and other risks.  Hepatitis C blood test.  Hepatitis B blood test.  Sexually transmitted disease (STD) testing.  Diabetes screening. This is done by checking your blood sugar (glucose) after you have not eaten for a while (fasting). You may have this done every 1-3 years.  Abdominal aortic aneurysm (AAA) screening. You may need this if you are a current or former smoker.  Osteoporosis. You may be screened starting at age 76 if you are at high risk. Talk with your health care provider about your test results, treatment options, and if necessary, the need for more tests. Vaccines  Your health care provider may recommend certain vaccines, such  as:  Influenza vaccine. This is recommended every year.  Tetanus, diphtheria, and acellular pertussis (Tdap, Td) vaccine. You may need a Td booster every 10 years.  Zoster vaccine. You may need this after age  44.  Pneumococcal 13-valent conjugate (PCV13) vaccine. One dose is recommended after age 14.  Pneumococcal polysaccharide (PPSV23) vaccine. One dose is recommended after age 41. Talk to your health care provider about which screenings and vaccines you need and how often you need them. This information is not intended to replace advice given to you by your health care provider. Make sure you discuss any questions you have with your health care provider. Document Released: 05/25/2015 Document Revised: 01/16/2016 Document Reviewed: 02/27/2015 Elsevier Interactive Patient Education  2017 [place] Prevention in the Home Falls can cause injuries. They can happen to people of all ages. There are many things you can do to make your home safe and to help prevent falls. What can I do on the outside of my home?  Regularly fix the edges of walkways and driveways and fix any cracks.  Remove anything that might make you trip as you walk through a door, such as a raised step or threshold.  Trim any bushes or trees on the path to your home.  Use bright outdoor lighting.  Clear any walking paths of anything that might make someone trip, such as rocks or tools.  Regularly check to see if handrails are loose or broken. Make sure that both sides of any steps have handrails.  Any raised decks and porches should have guardrails on the edges.  Have any leaves, snow, or ice cleared regularly.  Use sand or salt on walking paths during winter.  Clean up any spills in your garage right away. This includes oil or grease spills. What can I do in the bathroom?  Use night lights.  Install grab bars by the toilet and in the tub and shower. Do not use towel bars as grab bars.  Use non-skid mats or decals in the tub or shower.  If you need to sit down in the shower, use a plastic, non-slip stool.  Keep the floor dry. Clean up any water that spills on the floor as soon as it happens.  Remove  soap buildup in the tub or shower regularly.  Attach bath mats securely with double-sided non-slip rug tape.  Do not have throw rugs and other things on the floor that can make you trip. What can I do in the bedroom?  Use night lights.  Make sure that you have a light by your bed that is easy to reach.  Do not use any sheets or blankets that are too big for your bed. They should not hang down onto the floor.  Have a firm chair that has side arms. You can use this for support while you get dressed.  Do not have throw rugs and other things on the floor that can make you trip. What can I do in the kitchen?  Clean up any spills right away.  Avoid walking on wet floors.  Keep items that you use a lot in easy-to-reach places.  If you need to reach something above you, use a strong step stool that has a grab bar.  Keep electrical cords out of the way.  Do not use floor polish or wax that makes floors slippery. If you must use wax, use non-skid floor wax.  Do not have throw rugs and other things on the  floor that can make you trip. What can I do with my stairs?  Do not leave any items on the stairs.  Make sure that there are handrails on both sides of the stairs and use them. Fix handrails that are broken or loose. Make sure that handrails are as long as the stairways.  Check any carpeting to make sure that it is firmly attached to the stairs. Fix any carpet that is loose or worn.  Avoid having throw rugs at the top or bottom of the stairs. If you do have throw rugs, attach them to the floor with carpet tape.  Make sure that you have a light switch at the top of the stairs and the bottom of the stairs. If you do not have them, ask someone to add them for you. What else can I do to help prevent falls?  Wear shoes that:  Do not have high heels.  Have rubber bottoms.  Are comfortable and fit you well.  Are closed at the toe. Do not wear sandals.  If you use a  stepladder:  Make sure that it is fully opened. Do not climb a closed stepladder.  Make sure that both sides of the stepladder are locked into place.  Ask someone to hold it for you, if possible.  Clearly mark and make sure that you can see:  Any grab bars or handrails.  First and last steps.  Where the edge of each step is.  Use tools that help you move around (mobility aids) if they are needed. These include:  Canes.  Walkers.  Scooters.  Crutches.  Turn on the lights when you go into a dark area. Replace any light bulbs as soon as they burn out.  Set up your furniture so you have a clear path. Avoid moving your furniture around.  If any of your floors are uneven, fix them.  If there are any pets around you, be aware of where they are.  Review your medicines with your doctor. Some medicines can make you feel dizzy. This can increase your chance of falling. Ask your doctor what other things that you can do to help prevent falls. This information is not intended to replace advice given to you by your health care provider. Make sure you discuss any questions you have with your health care provider. Document Released: 02/22/2009 Document Revised: 10/04/2015 Document Reviewed: 06/02/2014 Elsevier Interactive Patient Education  2017 [employer].

## 2019-06-15 ENCOUNTER — Emergency Department
Admission: EM | Admit: 2019-06-15 | Discharge: 2019-06-15 | Disposition: A | Discharge status: Home / Self Care | Payer: No Typology Code available for payment source | Attending: Emergency Medicine | Admitting: Emergency Medicine

## 2019-06-15 ENCOUNTER — Emergency Department: Payer: No Typology Code available for payment source

## 2019-06-15 ENCOUNTER — Encounter: Payer: Self-pay | Admitting: Emergency Medicine

## 2019-06-15 DIAGNOSIS — R1311 Dysphagia, oral phase: Secondary | ICD-10-CM | POA: Insufficient documentation

## 2019-06-15 DIAGNOSIS — Z87891 Personal history of nicotine dependence: Secondary | ICD-10-CM | POA: Insufficient documentation

## 2019-06-15 DIAGNOSIS — R52 Pain, unspecified: Secondary | ICD-10-CM | POA: Diagnosis not present

## 2019-06-15 DIAGNOSIS — Z79899 Other long term (current) drug therapy: Secondary | ICD-10-CM | POA: Insufficient documentation

## 2019-06-15 DIAGNOSIS — G20 Parkinson's disease: Secondary | ICD-10-CM | POA: Diagnosis not present

## 2019-06-15 DIAGNOSIS — I959 Hypotension, unspecified: Secondary | ICD-10-CM | POA: Diagnosis not present

## 2019-06-15 DIAGNOSIS — F0280 Dementia in other diseases classified elsewhere without behavioral disturbance: Secondary | ICD-10-CM | POA: Diagnosis not present

## 2019-06-15 DIAGNOSIS — R070 Pain in throat: Secondary | ICD-10-CM | POA: Diagnosis not present

## 2019-06-15 DIAGNOSIS — M542 Cervicalgia: Secondary | ICD-10-CM | POA: Diagnosis not present

## 2019-06-15 DIAGNOSIS — C3490 Malignant neoplasm of unspecified part of unspecified bronchus or lung: Secondary | ICD-10-CM | POA: Insufficient documentation

## 2019-06-15 DIAGNOSIS — R069 Unspecified abnormalities of breathing: Secondary | ICD-10-CM | POA: Diagnosis not present

## 2019-06-15 DIAGNOSIS — R0602 Shortness of breath: Secondary | ICD-10-CM | POA: Diagnosis not present

## 2019-06-15 LAB — CBC WITH DIFFERENTIAL/PLATELET
Abs Immature Granulocytes: 0.02 K/uL (ref 0.00–0.07)
Basophils Absolute: 0 K/uL (ref 0.0–0.1)
Basophils Relative: 1 %
Eosinophils Absolute: 0 K/uL (ref 0.0–0.5)
Eosinophils Relative: 0 %
HCT: 28.9 % — ABNORMAL LOW (ref 39.0–52.0)
Hemoglobin: 8.9 g/dL — ABNORMAL LOW (ref 13.0–17.0)
Immature Granulocytes: 0 %
Lymphocytes Relative: 13 %
Lymphs Abs: 1 K/uL (ref 0.7–4.0)
MCH: 24.9 pg — ABNORMAL LOW (ref 26.0–34.0)
MCHC: 30.8 g/dL (ref 30.0–36.0)
MCV: 81 fL (ref 80.0–100.0)
Monocytes Absolute: 1.1 K/uL — ABNORMAL HIGH (ref 0.1–1.0)
Monocytes Relative: 15 %
Neutro Abs: 5.1 K/uL (ref 1.7–7.7)
Neutrophils Relative %: 71 %
Platelets: 256 K/uL (ref 150–400)
RBC: 3.57 MIL/uL — ABNORMAL LOW (ref 4.22–5.81)
RDW: 14.5 % (ref 11.5–15.5)
WBC: 7.3 K/uL (ref 4.0–10.5)
nRBC: 0 % (ref 0.0–0.2)

## 2019-06-15 LAB — BASIC METABOLIC PANEL WITH GFR
Anion gap: 9 (ref 5–15)
BUN: 16 mg/dL (ref 8–23)
CO2: 30 mmol/L (ref 22–32)
Calcium: 8.5 mg/dL — ABNORMAL LOW (ref 8.9–10.3)
Chloride: 100 mmol/L (ref 98–111)
Creatinine, Ser: 0.77 mg/dL (ref 0.61–1.24)
GFR calc Af Amer: >60 mL/min (ref >60)
GFR calc non Af Amer: >60 mL/min (ref >60)
Glucose, Bld: 113 mg/dL — ABNORMAL HIGH (ref 70–99)
Potassium: 3.9 mmol/L (ref 3.5–5.1)
Sodium: 139 mmol/L (ref 135–145)

## 2019-06-15 MED ORDER — LIDOCAINE VISCOUS HCL 2 % MT SOLN
15.0000 mL | Freq: Once | OROMUCOSAL | Status: AC
  Administered 2019-06-15: 15 mL via OROMUCOSAL
  Filled 2019-06-15: qty 15

## 2019-06-15 MED ORDER — LIDOCAINE VISCOUS HCL 2 % MT SOLN: 15.0000 mL | OROMUCOSAL | 1 refills | Status: DC | PRN

## 2019-06-15 MED ORDER — IOHEXOL 300 MG/ML  SOLN
75.0000 mL | Freq: Once | INTRAMUSCULAR | Status: AC | PRN
  Administered 2019-06-15: 75 mL via INTRAVENOUS

## 2019-06-15 NOTE — ED Notes (Signed)
Patient reports chest pain that started last night at [facility]. Patient reports pain when he sits up in the center of his chest and in his throat with movement (laying down or movements to sit up). Patient states this has occurred before but this event has continued and is a worsened pain. Patient states he feels short of breath when this pain occurs.

## 2019-06-15 NOTE — ED Notes (Signed)
Pt unable to sign D/C signature due to peripheral neuropathy and unable to hold pen.

## 2019-06-15 NOTE — ED Provider Notes (Signed)
[facility] Emergency Department Provider Note  ____________________________________________  Time seen: Approximately 5:59 AM  I have reviewed the triage vital signs and the nursing notes.   HISTORY  Chief Complaint Lung Cancer   HPI Darren Thomas. is a 74 y.o. male with a history of stage IV metastatic lung cancer not on chemo, Parkinson's disease, chronic dysphagia who presents for evaluation of throat pain.  History is gathered from patient and his wife over the phone.  Patient reports that he started having throat pain starting yesterday evening.  Has been unable to sleep throughout the night due to the pain.  He has a little bit of a hard time explaining the pain but it seems like it is sharp and present every time he breathes in. He points to the anterior mid neck as the location of the pain. He has difficulty and pain with swallowing however that is chronic for him.  He denies any chest pain or shortness of breath although his wife says that overnight he was complaining of shortness of breath because of the pain in his throat.  No vomiting.   Past Medical History:  Diagnosis Date   Abnormality of gait    Back pain    Cancer ([place]) 04/2019   stage IV lung cancer being treated at [facility], left hip muscle, stomach, top of small intesting, liver and lymphnode near lungs   Cervical spondylosis with myelopathy 01/06/2014   Depression    Insomnia    Parkinson disease ([place]) 04/2018   diagnosed by neurology at [facility]   PTSD (post-traumatic stress disorder)    PTSD (post-traumatic stress disorder)     Patient Active Problem List   Diagnosis Date Noted   Parkinson disease ([place]) 11/19/2018   Oral phase dysphagia 11/19/2018   Skin ulcer of right foot, limited to breakdown of skin ([place]) 05/21/2018   Thrombocytopenia ([place]) 05/21/2018   Squamous cell carcinoma of left lower leg 01/27/2018   Major depression, recurrent, chronic ([place]) 10/06/2017     Malnutrition ([place]) 10/06/2017   Hammer toe, acquired 06/19/2017   Chronic constipation 11/07/2016   Insomnia 06/20/2016   PTSD (post-traumatic stress disorder) 06/20/2016   Perennial allergic rhinitis with seasonal variation 02/05/2016   Cervical spondylosis with myelopathy 01/06/2014    Past Surgical History:  Procedure Laterality Date   C-spine     C-3-7 removed   SCALP LESION REMOVAL W/ FLAP AND SKIN GRAFT     SKIN CANCER EXCISION Left    squamos cell cancinoma    Prior to Admission medications   Medication Sig Start Date End Date Taking? Authorizing Provider  buPROPion HCl ER, XL, 450 MG TB24 Take 450 mg by mouth daily. Pt taking 300mg  daily 12/10/17   [provider]  carbidopa-levodopa (SINEMET IR) 25-100 MG tablet Take 3 tablets by mouth 3 (three) times daily.     [provider]  cyproheptadine (PERIACTIN) 4 MG tablet Take 4 mg by mouth daily.  04/30/19   [provider]  donepezil (ARICEPT) 10 MG tablet Take 10 mg by mouth at bedtime.    [provider]  escitalopram (LEXAPRO) 20 MG tablet Take 20 mg by mouth daily. 10/17/16   [provider]  fluticasone (FLONASE) 50 MCG/ACT nasal spray USE 2 SPRAYS IN EACH NOSTRIL EVERY DAY 10/07/18   Steele Sizer, MD  lamoTRIgine (LAMICTAL) 100 MG tablet 1 tablet daily. 02/14/18   [provider]  lidocaine (XYLOCAINE) 2 % solution Use as directed  15 mLs in the mouth or throat as needed for mouth pain. 06/15/19   Rudene Re, MD  Multiple Vitamins-Minerals (COMPLETE MULTIVITAMIN/MINERAL PO) Take 1 tablet by mouth daily. 12/20/13   [provider]  Multiple Vitamins-Minerals (PRESERVISION AREDS 2 PO) Take by mouth.    [provider]  pembrolizumab (KEYTRUDA) 100 MG/4ML SOLN Inject 2 mg/kg into the vein. Immun[facility] 6 weeks [facility], Historical, [place]  polyethylene glycol (MIRALAX / GLYCOLAX) packet Take 17 g by mouth every  other day.    [provider]  traZODone (DESYREL) 100 MG tablet Take 1 tablet by mouth at bedtime. 02/03/19   [provider]    Allergies Augmentin [amoxicillin-pot clavulanate]  Family History  Problem Relation Age of Onset   Cervical cancer Mother    Heart Problems Father    Diabetes Father    Asthma Brother    Emphysema Brother        was a smoker   Diabetes type I Son    Colon cancer Son 46    Social History Social History   Tobacco Use   Smoking status: Former Smoker    Packs/day: 1.00    Years: 20.00    Pack years: 20.00    Types: Cigarettes    Quit date: 1973    Years since quitting: 48.1   Smokeless tobacco: Never Used   Tobacco comment: smoking cessation materials not required  Substance Use Topics   Alcohol use: No   Drug use: No    Review of Systems  Constitutional: Negative for fever. Eyes: Negative for visual changes. ENT: + throat pain. Neck: No neck pain  Cardiovascular: Negative for chest pain. Respiratory: + shortness of breath. Gastrointestinal: Negative for abdominal pain, vomiting or diarrhea. Genitourinary: Negative for dysuria. Musculoskeletal: Negative for back pain. Skin: Negative for rash. Neurological: Negative for headaches, weakness or numbness. Psych: No SI or HI  ____________________________________________   PHYSICAL EXAM:  VITAL SIGNS: ED Triage Vitals [06/15/19 0457]  Enc Vitals Group     BP (!) 143/66     Pulse Rate 77     Resp 20     Temp 98.7 F (37.1 C)     Temp Source Oral     SpO2 100 %     Weight      Height      Head Circumference      Peak Flow      Pain Score      Pain Loc      Pain Edu?      Excl. in [place]?     Constitutional: Alert and oriented. Well appearing and in no apparent distress. HEENT:      Head: Normocephalic and atraumatic.         Eyes: Conjunctivae are normal. Sclera is non-icteric.       Mouth/Throat: Mucous membranes are moist.  Oropharynx is clear  with no swelling, erythema, exudates or any other abnormality      Neck: Supple with no signs of meningismus.  Palpation of the neck elicits discomfort bilaterally but worse on the right with no obvious palpable masses or enlarged lymph nodes Cardiovascular: Regular rate and rhythm. No murmurs, gallops, or rubs. 2+ symmetrical distal pulses are present in all extremities. No JVD. Respiratory: Normal respiratory effort. Lungs are clear to auscultation bilaterally. No wheezes, crackles, or rhonchi.  Gastrointestinal: Soft, non tender. Musculoskeletal: No edema, cyanosis, or erythema of extremities. Neurologic: Normal speech and language.  Face is symmetric. Moving all extremities. No gross focal neurologic deficits are appreciated. Skin: Skin is warm, dry and intact. No rash noted. Psychiatric: Mood and affect are normal. Speech and behavior are normal.  ____________________________________________   LABS (all labs ordered are listed, but only abnormal results are displayed)  Labs Reviewed  CBC WITH DIFFERENTIAL/PLATELET - Abnormal; Notable for the following components:      Result Value   RBC 3.57 (*)    Hemoglobin 8.9 (*)    HCT 28.9 (*)    MCH 24.9 (*)    Monocytes Absolute 1.1 (*)    All other components within normal limits  BASIC METABOLIC PANEL - Abnormal; Notable for the following components:   Glucose, Bld 113 (*)    Calcium 8.5 (*)    All other components within normal limits   ____________________________________________  EKG  ED ECG REPORT I, Rudene Re, the attending physician, personally viewed and interpreted this ECG.  Normal sinus rhythm, rate of 79, left bundle branch block, prolonged QTC, normal axis, no ST elevations or depressions.  EKG is unchanged from prior ____________________________________________  RADIOLOGY  I have personally reviewed the images performed during this visit and I agree with the Radiologist's read.   Interpretation by  Radiologist:  CT Soft Tissue Neck W Contrast  Result Date: 06/15/2019 CLINICAL DATA:  Acute neck pain.  Known malignancy EXAM: CT NECK WITH CONTRAST TECHNIQUE: Multidetector CT imaging of the neck was performed using the standard protocol following the bolus administration of intravenous contrast. CONTRAST:  59mL OMNIPAQUE IOHEXOL 300 MG/ML  SOLN COMPARISON:  None. FINDINGS: Pharynx and larynx: No evidence of mass or swelling. Salivary glands: No inflammation, mass, or stone. Thyroid: Normal. Lymph nodes: None enlarged or abnormal density. Vascular: Atherosclerotic calcification of the carotids. Limited intracranial: Negative Visualized orbits: Negative Mastoids and visualized paranasal sinuses: Clear Skeleton: Cervical spine fusion and ankylosis with diffuse laminectomy. Posterior longitudinal ligament ossification is present, most notable at C6. Upper chest: Spiculated left upper lobe lung nodule with pleural tail, measuring 2.4 cm. IMPRESSION: 1. No acute or malignant finding in the neck. 2. Spiculated left upper lobe nodule correlating with history of lung cancer. Electronically Signed   By: Monte Fantasia M.D.   On: 06/15/2019 06:56   DG Chest Portable 1 View  Result Date: 06/15/2019 CLINICAL DATA:  Shortness of breath and chest pain. EXAM: PORTABLE CHEST 1 VIEW COMPARISON:  05/15/2017 FINDINGS: The cardiac silhouette, mediastinal and hilar contours are within normal limits and stable. Streaky basilar scarring changes. Stable left upper lobe calcified granuloma. No acute infiltrates, edema or effusions. No worrisome pulmonary lesions. The bony thorax is intact. IMPRESSION: No acute cardiopulmonary findings. Chronic basilar scarring changes. Electronically Signed   By: Marijo Sanes M.D.   On: 06/15/2019 06:39      ____________________________________________   PROCEDURES  Procedure(s) performed: None Procedures Critical Care performed:   None ____________________________________________   INITIAL IMPRESSION / ASSESSMENT AND PLAN / ED COURSE  74 y.o. male with a history of stage IV metastatic lung cancer not on chemo, Parkinson's disease, chronic dysphagia who presents for evaluation of throat pain.  Pain is in the anterior mid neck with no obvious palpable abnormalities, oropharynx is clear otherwise.  Patient is in no respiratory distress, normal sats, normal work of breathing, lungs are clear to auscultation.  Due to history of metastatic disease, will get CT with contrast to eval for mets adjacent to airway although at this time there is no clinical evidence of impending  airway. Will give viscous lidocaine for discomfort. Also possibly pain from chronic dysphagia.    History was gathered from patient's wife who is his healthcare power of attorney since patient has a history of Parkinson's dementia.  Patient is pretty with it  And able to provide good history at this time.  According to his wife, they have made a decision with his oncologist last week for comfort care only since they did not wish to continue chemotherapy.  She also told me that patient has a known slow GI bleed and they have also refused blood transfusions or any surgical intervention for that.      _________________________ 7:48 AM on 06/15/2019 -----------------------------------------  Labs showing known anemia with a hemoglobin of 8.9.  According to patient's wife, his most recent hemoglobin at the [place] on Friday was 7.5.  Patient is hemodynamically stable.  The CT of the neck did not show any acute abnormalities.  At this time his presentation is most likely due to pain referred from his tumor versus worsening chronic dysphagia.  Will discharge home with a prescription for viscous lidocaine for comfort.  Recommended follow-up with his [place] doctor.  Discussed my standard return precautions.     As part of my medical decision making, I reviewed the following  data within the [place] History obtained from family, Nursing notes reviewed and incorporated, Labs reviewed , EKG interpreted , Old EKG reviewed, Old chart reviewed, Radiograph reviewed , Notes from prior ED visits and [place] Controlled Substance Database   Please note:  Patient was evaluated in Emergency Department today for the symptoms described in the history of present illness. Patient was evaluated in the context of the global COVID-19 pandemic, which necessitated consideration that the patient might be at risk for infection with the SARS-CoV-2 virus that causes COVID-19. Institutional protocols and algorithms that pertain to the evaluation of patients at risk for COVID-19 are in a state of rapid change based on information released by regulatory bodies including the CDC and federal and state organizations. These policies and algorithms were followed during the patient's care in the ED.  Some ED evaluations and interventions may be delayed as a result of limited staffing during the pandemic.   ____________________________________________   FINAL CLINICAL IMPRESSION(S) / ED DIAGNOSES   Final diagnoses:  Throat pain      NEW MEDICATIONS STARTED DURING THIS VISIT:  ED Discharge Orders         Ordered    lidocaine (XYLOCAINE) 2 % solution  As needed     06/15/19 2376           Note:  This document was prepared using Dragon voice recognition software and may include unintentional dictation errors.    Alfred Levins, [place], MD 06/15/19 [phone]

## 2019-06-15 NOTE — ED Triage Notes (Signed)
Pt arrived via EMS from home where pt called out due to pain in throat. Per EMS pt was recently diagnosed with stage 4 lung cancer with grim prognosis. Pt c/o throat pain since going to sleep.

## 2019-06-15 NOTE — ED Notes (Signed)
Patient transported to CT 

## 2019-11-12 ENCOUNTER — Other Ambulatory Visit: Payer: Self-pay | Admitting: Family Medicine

## 2019-11-12 DIAGNOSIS — J302 Other seasonal allergic rhinitis: Secondary | ICD-10-CM

## 2019-11-12 DIAGNOSIS — J3089 Other allergic rhinitis: Secondary | ICD-10-CM

## 2019-11-12 NOTE — Telephone Encounter (Signed)
Requested Prescriptions  Pending Prescriptions Disp Refills  . fluticasone (FLONASE) 50 MCG/ACT nasal spray [Pharmacy Med Name: FLUTICASONE PROPIONATE 50 MCG/ACT Suspension] 48 g 0    Sig: USE 2 SPRAYS IN EACH NOSTRIL EVERY DAY     Ear, Nose, and Throat: Nasal Preparations - Corticosteroids Passed - 11/12/2019 12:53 AM      Passed - Valid encounter within last 12 months    Recent Outpatient Visits          11 months ago Other chronic sinusitis   [facility] Steele Sizer, MD   1 year ago Acute recurrent maxillary sinusitis   [facility] Steele Sizer, MD   1 year ago Mild malnutrition [facility])   [facility] Steele Sizer, MD   1 year ago Cachexia [facility])   [facility] Steele Sizer, MD   2 years ago Cachexia [facility])   [facility] Steele Sizer, MD

## 2020-07-31 IMAGING — CT CT NECK W/ CM
1 series · 12 of 14 positions shown, 15 images · IV contrast (omnipaque)
Comparison: None.

CLINICAL DATA: Acute neck pain.  Known malignancy

EXAM:
CT NECK WITH CONTRAST
TECHNIQUE: Multidetector CT imaging of the neck was performed using the
standard protocol following the bolus administration of intravenous
contrast.
CONTRAST:  75mL OMNIPAQUE IOHEXOL 300 MG/ML  SOLN

[Series 6: axial neck st # 2 recon (person_name) · axial · 0.61mm/px · z∈[-321,-143]mm · 12 of 107 slices shown, 15 images]
[im 9/107  soft-tissue]
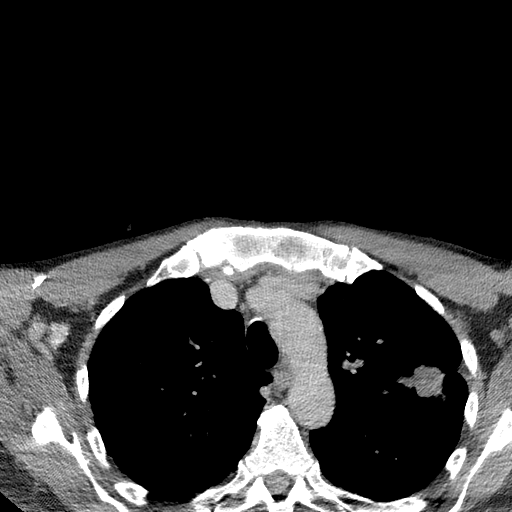
[im 9/107  bone]
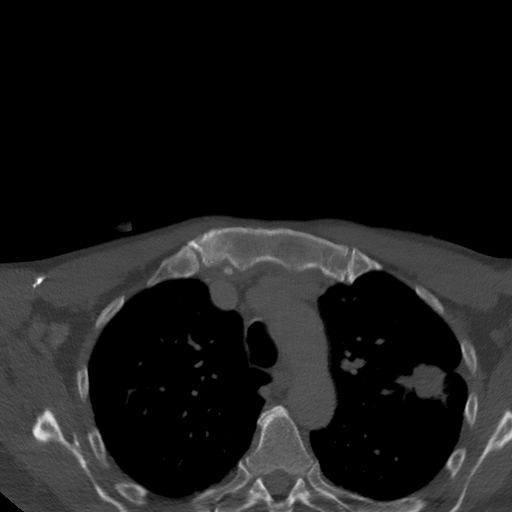
[im 17/107  bone]
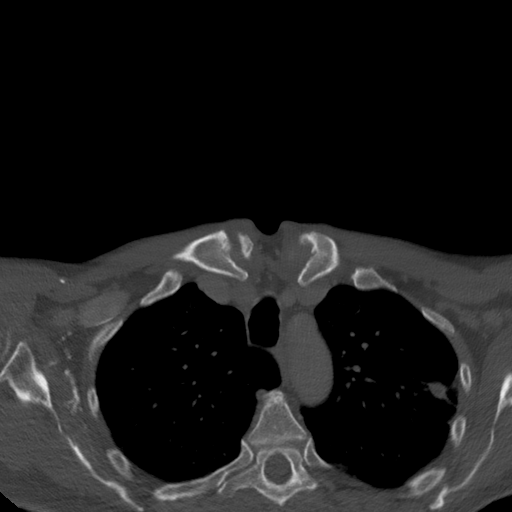
[im 25/107  bone]
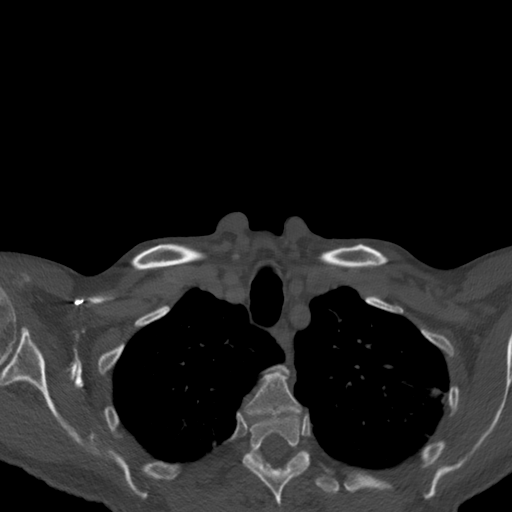
[im 33/107  bone]
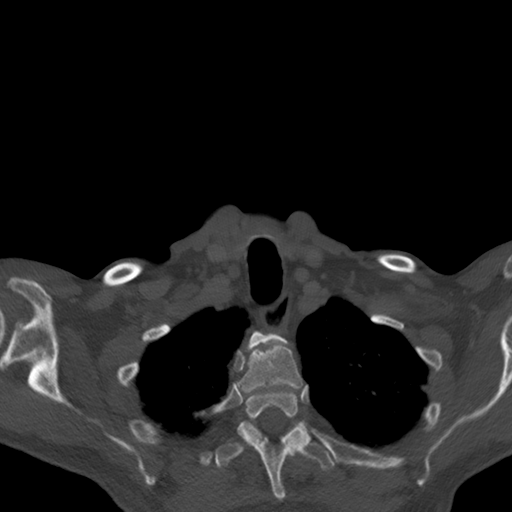
[im 41/107  soft-tissue]
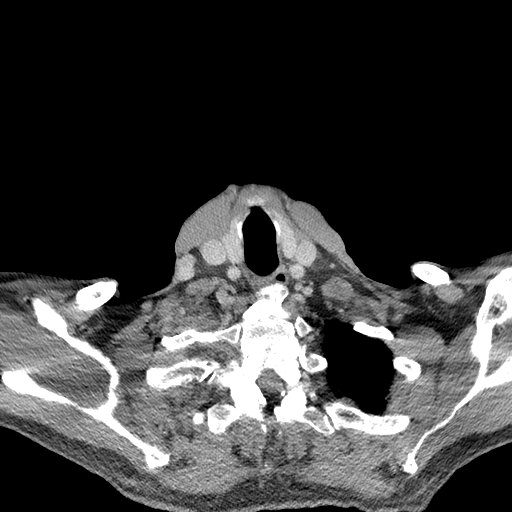
[im 41/107  bone]
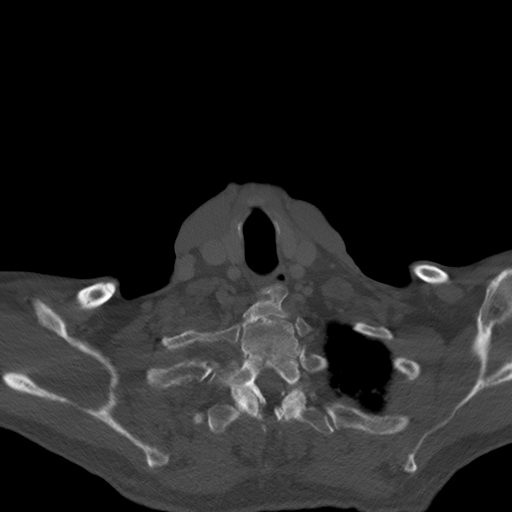
[im 49/107  bone]
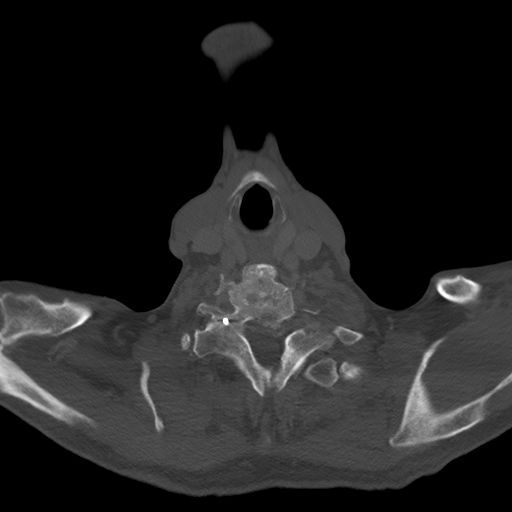
[im 58/107  bone]
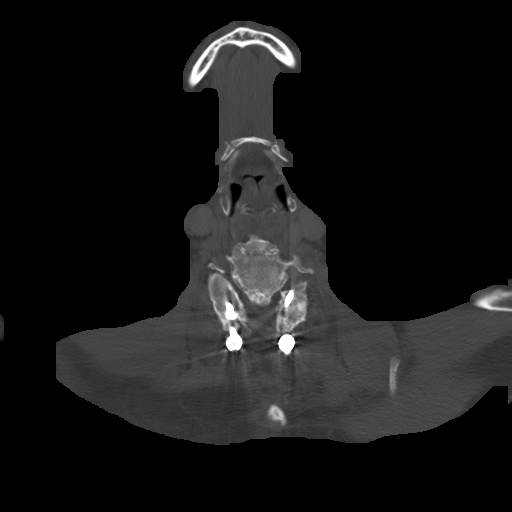
[im 66/107  bone]
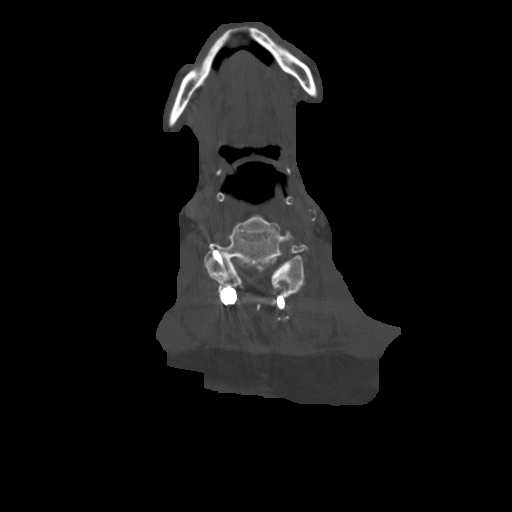
[im 74/107  soft-tissue]
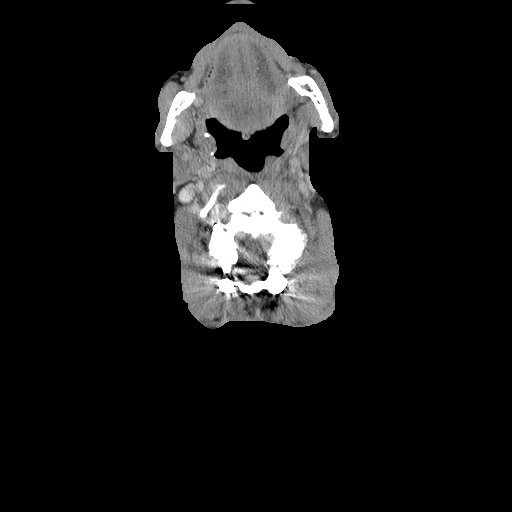
[im 74/107  bone]
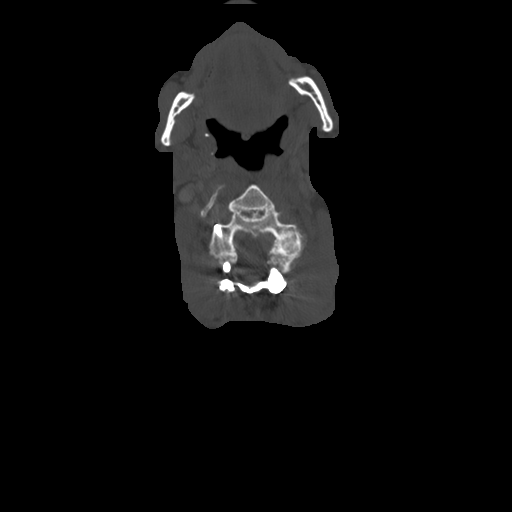
[im 82/107  bone]
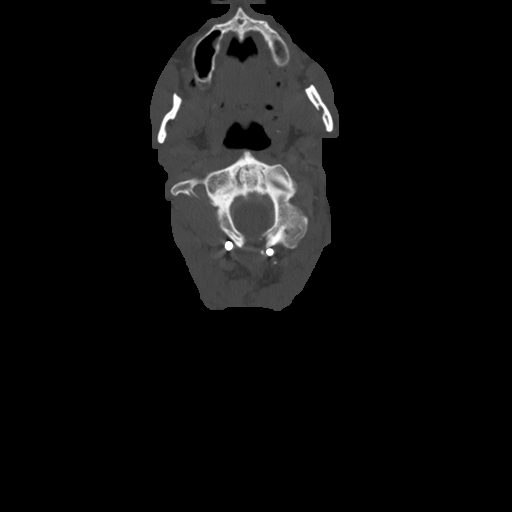
[im 90/107  bone]
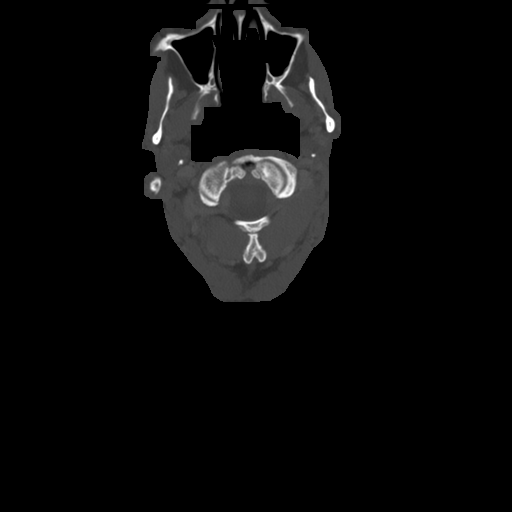
[im 98/107  bone]
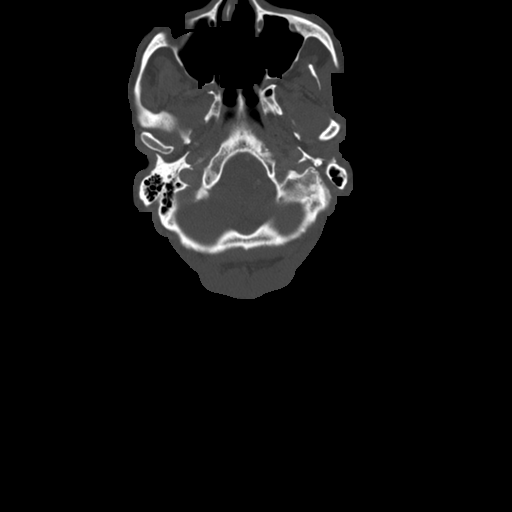

[12 of 14 positions shown; findings below may reference images not displayed]

FINDINGS: Pharynx and larynx: No evidence of mass or swelling.

Salivary glands: No inflammation, mass, or stone.

Thyroid: Normal.

Lymph nodes: None enlarged or abnormal density.

Vascular: Atherosclerotic calcification of the carotids.

Limited intracranial: Negative

Visualized orbits: Negative

Mastoids and visualized paranasal sinuses: Clear

Skeleton: Cervical spine fusion and ankylosis with diffuse
laminectomy. Posterior longitudinal ligament ossification is
present, most notable at C6.

Upper chest: Spiculated left upper lobe lung nodule with pleural
tail, measuring 2.4 cm.
IMPRESSION: 1. No acute or malignant finding in the neck.
2. Spiculated left upper lobe nodule correlating with history of
lung cancer.

## 2020-09-07 ENCOUNTER — Other Ambulatory Visit: Payer: Self-pay

## 2020-09-07 ENCOUNTER — Inpatient Hospital Stay
Admission: EM | Admit: 2020-09-07 | Discharge: 2020-09-13 | DRG: 286 | Disposition: A | Discharge status: Home Health | Payer: No Typology Code available for payment source | Attending: Internal Medicine | Admitting: Internal Medicine

## 2020-09-07 DIAGNOSIS — K529 Noninfective gastroenteritis and colitis, unspecified: Secondary | ICD-10-CM | POA: Diagnosis present

## 2020-09-07 DIAGNOSIS — E43 Unspecified severe protein-calorie malnutrition: Secondary | ICD-10-CM | POA: Diagnosis not present

## 2020-09-07 DIAGNOSIS — U071 COVID-19: Secondary | ICD-10-CM | POA: Diagnosis not present

## 2020-09-07 DIAGNOSIS — Z8049 Family history of malignant neoplasm of other genital organs: Secondary | ICD-10-CM | POA: Diagnosis not present

## 2020-09-07 DIAGNOSIS — R531 Weakness: Secondary | ICD-10-CM | POA: Diagnosis not present

## 2020-09-07 DIAGNOSIS — Z85828 Personal history of other malignant neoplasm of skin: Secondary | ICD-10-CM | POA: Diagnosis not present

## 2020-09-07 DIAGNOSIS — I5022 Chronic systolic (congestive) heart failure: Secondary | ICD-10-CM | POA: Diagnosis present

## 2020-09-07 DIAGNOSIS — I472 Ventricular tachycardia, unspecified: Secondary | ICD-10-CM

## 2020-09-07 DIAGNOSIS — Z825 Family history of asthma and other chronic lower respiratory diseases: Secondary | ICD-10-CM

## 2020-09-07 DIAGNOSIS — Z8616 Personal history of COVID-19: Secondary | ICD-10-CM | POA: Diagnosis not present

## 2020-09-07 DIAGNOSIS — I429 Cardiomyopathy, unspecified: Secondary | ICD-10-CM

## 2020-09-07 DIAGNOSIS — G20 Parkinson's disease: Secondary | ICD-10-CM | POA: Diagnosis present

## 2020-09-07 DIAGNOSIS — Z87891 Personal history of nicotine dependence: Secondary | ICD-10-CM

## 2020-09-07 DIAGNOSIS — Z79899 Other long term (current) drug therapy: Secondary | ICD-10-CM | POA: Diagnosis not present

## 2020-09-07 DIAGNOSIS — F32A Depression, unspecified: Secondary | ICD-10-CM | POA: Diagnosis present

## 2020-09-07 DIAGNOSIS — Z85118 Personal history of other malignant neoplasm of bronchus and lung: Secondary | ICD-10-CM

## 2020-09-07 DIAGNOSIS — Z800 Family history of malignant neoplasm of digestive organs: Secondary | ICD-10-CM

## 2020-09-07 DIAGNOSIS — G4700 Insomnia, unspecified: Secondary | ICD-10-CM | POA: Diagnosis present

## 2020-09-07 DIAGNOSIS — R54 Age-related physical debility: Secondary | ICD-10-CM | POA: Diagnosis present

## 2020-09-07 DIAGNOSIS — K5900 Constipation, unspecified: Secondary | ICD-10-CM | POA: Diagnosis present

## 2020-09-07 DIAGNOSIS — R9431 Abnormal electrocardiogram [ECG] [EKG]: Secondary | ICD-10-CM | POA: Diagnosis not present

## 2020-09-07 DIAGNOSIS — Z20822 Contact with and (suspected) exposure to covid-19: Secondary | ICD-10-CM | POA: Diagnosis present

## 2020-09-07 DIAGNOSIS — F4310 Post-traumatic stress disorder, unspecified: Secondary | ICD-10-CM | POA: Diagnosis present

## 2020-09-07 DIAGNOSIS — Z881 Allergy status to other antibiotic agents status: Secondary | ICD-10-CM | POA: Diagnosis not present

## 2020-09-07 DIAGNOSIS — R55 Syncope and collapse: Secondary | ICD-10-CM | POA: Diagnosis present

## 2020-09-07 DIAGNOSIS — I2510 Atherosclerotic heart disease of native coronary artery without angina pectoris: Secondary | ICD-10-CM | POA: Diagnosis present

## 2020-09-07 DIAGNOSIS — I459 Conduction disorder, unspecified: Secondary | ICD-10-CM | POA: Diagnosis present

## 2020-09-07 DIAGNOSIS — R64 Cachexia: Secondary | ICD-10-CM | POA: Diagnosis present

## 2020-09-07 DIAGNOSIS — I959 Hypotension, unspecified: Secondary | ICD-10-CM | POA: Diagnosis present

## 2020-09-07 DIAGNOSIS — C3490 Malignant neoplasm of unspecified part of unspecified bronchus or lung: Secondary | ICD-10-CM | POA: Diagnosis present

## 2020-09-07 DIAGNOSIS — E860 Dehydration: Secondary | ICD-10-CM | POA: Diagnosis present

## 2020-09-07 DIAGNOSIS — E876 Hypokalemia: Secondary | ICD-10-CM | POA: Diagnosis not present

## 2020-09-07 DIAGNOSIS — E162 Hypoglycemia, unspecified: Secondary | ICD-10-CM | POA: Diagnosis not present

## 2020-09-07 DIAGNOSIS — I428 Other cardiomyopathies: Secondary | ICD-10-CM | POA: Diagnosis not present

## 2020-09-07 DIAGNOSIS — B961 Klebsiella pneumoniae [K. pneumoniae] as the cause of diseases classified elsewhere: Secondary | ICD-10-CM | POA: Diagnosis not present

## 2020-09-07 DIAGNOSIS — N390 Urinary tract infection, site not specified: Secondary | ICD-10-CM | POA: Diagnosis not present

## 2020-09-07 DIAGNOSIS — M1A9XX1 Chronic gout, unspecified, with tophus (tophi): Secondary | ICD-10-CM

## 2020-09-07 DIAGNOSIS — Z66 Do not resuscitate: Secondary | ICD-10-CM | POA: Diagnosis present

## 2020-09-07 DIAGNOSIS — Z833 Family history of diabetes mellitus: Secondary | ICD-10-CM

## 2020-09-07 DIAGNOSIS — I700 Atherosclerosis of aorta: Secondary | ICD-10-CM | POA: Diagnosis present

## 2020-09-07 DIAGNOSIS — Z888 Allergy status to other drugs, medicaments and biological substances status: Secondary | ICD-10-CM

## 2020-09-07 DIAGNOSIS — I358 Other nonrheumatic aortic valve disorders: Secondary | ICD-10-CM | POA: Diagnosis present

## 2020-09-07 LAB — CBC
HCT: 37.7 % — ABNORMAL LOW (ref 39.0–52.0)
Hemoglobin: 13.4 g/dL (ref 13.0–17.0)
MCH: 29.2 pg (ref 26.0–34.0)
MCHC: 35.5 g/dL (ref 30.0–36.0)
MCV: 82.1 fL (ref 80.0–100.0)
Platelets: 284 10*3/uL (ref 150–400)
RBC: 4.59 MIL/uL (ref 4.22–5.81)
RDW: 14.1 % (ref 11.5–15.5)
WBC: 9.4 10*3/uL (ref 4.0–10.5)
nRBC: 0 % (ref 0.0–0.2)

## 2020-09-07 LAB — COMPREHENSIVE METABOLIC PANEL
ALT: 9 U/L (ref 0–44)
AST: 21 U/L (ref 15–41)
Albumin: 3.3 g/dL — ABNORMAL LOW (ref 3.5–5.0)
Alkaline Phosphatase: 60 U/L (ref 38–126)
Anion gap: 10 (ref 5–15)
BUN: 19 mg/dL (ref 8–23)
CO2: 29 mmol/L (ref 22–32)
Calcium: 8.4 mg/dL — ABNORMAL LOW (ref 8.9–10.3)
Chloride: 98 mmol/L (ref 98–111)
Creatinine, Ser: 0.84 mg/dL (ref 0.61–1.24)
GFR, Estimated: >60 mL/min (ref >60)
Glucose, Bld: 112 mg/dL — ABNORMAL HIGH (ref 70–99)
Potassium: 2.6 mmol/L — CL (ref 3.5–5.1)
Sodium: 137 mmol/L (ref 135–145)
Total Bilirubin: 1 mg/dL (ref 0.3–1.2)
Total Protein: 6 g/dL — ABNORMAL LOW (ref 6.5–8.1)

## 2020-09-07 LAB — TROPONIN I (HIGH SENSITIVITY): Troponin I (High Sensitivity): 7 ng/L (ref <18)

## 2020-09-07 LAB — MAGNESIUM: Magnesium: 1.9 mg/dL (ref 1.7–2.4)

## 2020-09-07 LAB — CK: Total CK: 166 U/L (ref 49–397)

## 2020-09-07 MED ORDER — SODIUM CHLORIDE 0.9 % IV BOLUS
1000.0000 mL | Freq: Once | INTRAVENOUS | Status: AC
  Administered 2020-09-07: 1000 mL via INTRAVENOUS

## 2020-09-07 MED ORDER — SODIUM CHLORIDE 0.9 % IV BOLUS
1000.0000 mL | Freq: Once | INTRAVENOUS | Status: AC
  Administered 2020-09-08: 1000 mL via INTRAVENOUS

## 2020-09-07 MED ORDER — MAGNESIUM SULFATE 2 GM/50ML IV SOLN
2.0000 g | Freq: Once | INTRAVENOUS | Status: AC
  Administered 2020-09-08: 2 g via INTRAVENOUS
  Filled 2020-09-07: qty 50

## 2020-09-07 MED ORDER — POTASSIUM CHLORIDE 10 MEQ/100ML IV SOLN: 10.0000 meq | Freq: Once | INTRAVENOUS | Status: DC

## 2020-09-07 MED ORDER — AMIODARONE LOAD VIA INFUSION
150.0000 mg | Freq: Once | INTRAVENOUS | Status: AC
  Administered 2020-09-07: 150 mg via INTRAVENOUS
  Filled 2020-09-07: qty 83.34

## 2020-09-07 MED ORDER — AMIODARONE HCL IN DEXTROSE 360-4.14 MG/200ML-% IV SOLN
60.0000 mg/h | INTRAVENOUS | Status: DC
  Administered 2020-09-07: 60 mg/h via INTRAVENOUS
  Filled 2020-09-07: qty 200

## 2020-09-07 MED ORDER — POTASSIUM CHLORIDE 10 MEQ/100ML IV SOLN
10.0000 meq | INTRAVENOUS | Status: AC
  Administered 2020-09-08 (×5): 10 meq via INTRAVENOUS
  Filled 2020-09-07 (×4): qty 100

## 2020-09-07 MED ORDER — AMIODARONE HCL IN DEXTROSE 360-4.14 MG/200ML-% IV SOLN: 30.0000 mg/h | INTRAVENOUS | Status: DC

## 2020-09-07 NOTE — ED Provider Notes (Addendum)
[facility] Emergency Department Provider Note  Time seen: 11:15 PM  I have reviewed the triage vital signs and the nursing notes.   HISTORY  Chief Complaint Loss of Consciousness (Pt arrives to ED from home. Reported to have syncopal episode while sitting in recliner. Pt states he got "real hot & then just went out of it" Patient A/O x 3 upon ems arrival. Hx of Parkinson's & lung ca)   HPI Darren Thomas. is a 75 y.o. male with a past medical history of stage IV lung cancer, depression, Parkinson's, recent COVID diagnosis 2 to 3 weeks ago, presents to the emergency department for syncopal episode.  According to the wife and the patient patient states over the past 2 to 3 weeks he has not been eating or drinking much.  States he has been nauseated with frequent episodes of vomiting as well as diarrhea.  States he was sitting in his recliner today when he lost consciousness per wife for nearly a minute.  EMS was called and brought the patient to the emergency department.  Here the patient is awake alert and talking.  No distress.   Past Medical History:  Diagnosis Date  . Abnormality of gait   . Back pain   . Cancer ([place]) 04/2019   stage IV lung cancer being treated at [facility], left hip muscle, stomach, top of small intesting, liver and lymphnode near lungs  . Cervical spondylosis with myelopathy 01/06/2014  . Depression   . Insomnia   . Parkinson disease ([place]) 04/2018   diagnosed by neurology at [facility]  . PTSD (post-traumatic stress disorder)   . PTSD (post-traumatic stress disorder)     Patient Active Problem List   Diagnosis Date Noted  . Parkinson disease ([place]) 11/19/2018  . Oral phase dysphagia 11/19/2018  . Skin ulcer of right foot, limited to breakdown of skin ([place]) 05/21/2018  . Thrombocytopenia ([place]) 05/21/2018  . Squamous cell carcinoma of left lower leg 01/27/2018  . Major depression, recurrent, chronic ([place]) 10/06/2017  . Malnutrition ([place])  10/06/2017  . Hammer toe, acquired 06/19/2017  . Chronic constipation 11/07/2016  . Insomnia 06/20/2016  . PTSD (post-traumatic stress disorder) 06/20/2016  . Perennial allergic rhinitis with seasonal variation 02/05/2016  . Cervical spondylosis with myelopathy 01/06/2014    Past Surgical History:  Procedure Laterality Date  . C-spine     C-3-7 removed  . SCALP LESION REMOVAL W/ FLAP AND SKIN GRAFT    . SKIN CANCER EXCISION Left    squamos cell cancinoma    Prior to Admission medications   Medication Sig Start Date End Date Taking? Authorizing Provider  buPROPion HCl ER, XL, 450 MG TB24 Take 450 mg by mouth daily. Pt taking 300mg  daily 12/10/17   [provider]  carbidopa-levodopa (SINEMET IR) 25-100 MG tablet Take 3 tablets by mouth 3 (three) times daily.     [provider]  cyproheptadine (PERIACTIN) 4 MG tablet Take 4 mg by mouth daily.  04/30/19   [provider]  donepezil (ARICEPT) 10 MG tablet Take 10 mg by mouth at bedtime.    [provider]  escitalopram (LEXAPRO) 20 MG tablet Take 20 mg by mouth daily. 10/17/16   [provider]  fluticasone (FLONASE) 50 MCG/ACT nasal spray USE 2 SPRAYS IN EACH NOSTRIL EVERY DAY 11/12/19   Steele Sizer, MD  lamoTRIgine (LAMICTAL) 100 MG tablet 1 tablet daily. 02/14/18   [provider]  lidocaine (XYLOCAINE) 2 % solution Use  as directed 15 mLs in the mouth or throat as needed for mouth pain. 06/15/19   Rudene Re, MD  Multiple Vitamins-Minerals (COMPLETE MULTIVITAMIN/MINERAL PO) Take 1 tablet by mouth daily. 12/20/13   [provider]  Multiple Vitamins-Minerals (PRESERVISION AREDS 2 PO) Take by mouth.    [provider]  pembrolizumab (KEYTRUDA) 100 MG/4ML SOLN Inject 2 mg/kg into the vein. Immunotherapy treatments every 6 weeks at [facility], Historical, MD  polyethylene glycol (MIRALAX / GLYCOLAX) packet Take 17 g by mouth every other day.    [provider]  traZODone (DESYREL) 100 MG tablet Take 1 tablet by mouth at bedtime. 02/03/19   [provider]    Allergies  Allergen Reactions  . Augmentin [Amoxicillin-Pot Clavulanate] Diarrhea    Family History  Problem Relation Age of Onset  . Cervical cancer Mother   . Heart Problems Father   . Diabetes Father   . Asthma Brother   . Emphysema Brother        was a smoker  . Diabetes type I Son   . Colon cancer Son 101    Social History Social History   Tobacco Use  . Smoking status: Former Smoker    Packs/day: 1.00    Years: 20.00    Pack years: 20.00    Types: Cigarettes    Quit date: 1973    Years since quitting: 49.3  . Smokeless tobacco: Never Used  . Tobacco comment: smoking cessation materials not required  Vaping Use  . Vaping Use: Never used  Substance Use Topics  . Alcohol use: No  . Drug use: No    Review of Systems Constitutional: Negative for fever. Cardiovascular: Negative for chest pain. Respiratory: Negative for shortness of breath. Gastrointestinal: Negative for abdominal pain, vomiting  Musculoskeletal: Negative for musculoskeletal complaints Neurological: Negative for headache All other ROS negative  ____________________________________________   PHYSICAL EXAM:  VITAL SIGNS: ED Triage Vitals  Enc Vitals Group     BP 09/07/20 2206 (!) 111/59     Pulse Rate 09/07/20 2206 65     Resp 09/07/20 2206 18     Temp 09/07/20 2206 (!) 97.3 F (36.3 C)     Temp Source 09/07/20 2206 Oral     SpO2 09/07/20 2206 100 %     Weight 09/07/20 2213 155 lb 10.3 oz (70.6 kg)     Height 09/07/20 2213 6\' 2"  (1.88 m)     Head Circumference --      Peak Flow --      Pain Score 09/07/20 2213 0     Pain Loc --      Pain Edu? --      Excl. in [place]? --    Constitutional: Alert and oriented. Well appearing and in no distress. Eyes: Normal exam ENT      Head: Normocephalic and atraumatic.      Mouth/Throat: Dry mucous membranes Cardiovascular:  Normal rate, regular rhythm. Respiratory: Normal respiratory effort without tachypnea nor retractions. Breath sounds are clear  Gastrointestinal: Soft and nontender. No distention.  Musculoskeletal: Nontender with normal range of motion in all extremities.  Neurologic:  Normal speech and language. No gross focal neurologic deficits Skin:  Skin is warm, dry and intact.  Psychiatric: Mood and affect are normal  ____________________________________________    EKG  EKG viewed and interpreted by myself shows a sinus rhythm at 64 bpm with a slightly widened QRS, normal axis, slight QTC prolongation, nonspecific ST changes.  ____________________________________________   INITIAL IMPRESSION / ASSESSMENT AND PLAN / ED COURSE  Pertinent labs & imaging results that were available during my care of the patient were reviewed by me and considered in my medical decision making (see chart for details).   Patient presents emergency department for syncopal episode while in his recliner.  Patient has been dehydrated not eating or drinking much as well as frequent episodes of nausea vomiting and occasional episodes of diarrhea.  Patient appears dehydrated dry mucous membranes, poor skin turgor.  We will begin IV hydration.  We will check labs and continue to closely monitor.  Patient did state he has been having dark stool.  Rectal examination shows dark stool but guaiac negative.  Patient had a run of approximately 20+ beats of ventricular tachycardia up to 235 bpm.  Ventricular tachycardia stopped on its own.  We will place the patient on the [place] monitor.  I have ordered an amiodarone bolus and infusion for the patient.  Lab work is pending.  Patient will require admission once his work-up has been completed.  Patient has had 2 additional runs of ventricular tachycardia, gets lightheaded with these episodes but is not lost consciousness.  Patient's labs have resulted showing a low potassium of 2.6.  We will  replete with IV potassium, IV magnesium.  Patient is on amiodarone infusion.  We are also IV hydrating.  Patient will be admitted to the hospital service for further work-up and treatment.  Patient and wife agreeable to plan of care.  [name]. was evaluated in Emergency Department on 09/07/2020 for the symptoms described in the history of present illness. He was evaluated in the context of the global COVID-19 pandemic, which necessitated consideration that the patient might be at risk for infection with the SARS-CoV-2 virus that causes COVID-19. Institutional protocols and algorithms that pertain to the evaluation of patients at risk for COVID-19 are in a state of rapid change based on information released by regulatory bodies including the CDC and federal and state organizations. These policies and algorithms were followed during the patient's care in the ED.  CRITICAL CARE Performed by: Harvest Dark   Total critical care time: 30 minutes  Critical care time was exclusive of separately billable procedures and treating other patients.  Critical care was necessary to treat or prevent imminent or life-threatening deterioration.  Critical care was time spent personally by me on the following activities: development of treatment plan with patient and/or surrogate as well as nursing, discussions with consultants, evaluation of patient's response to treatment, examination of patient, obtaining history from patient or surrogate, ordering and performing treatments and interventions, ordering and review of laboratory studies, ordering and review of radiographic studies, pulse oximetry and re-evaluation of patient's condition.   ____________________________________________   FINAL CLINICAL IMPRESSION(S) / ED DIAGNOSES  Syncope Ventricular tachycardia Dehydration   Harvest Dark, MD 09/08/20 1962    Harvest Dark, MD 09/08/20 [phone]

## 2020-09-08 ENCOUNTER — Inpatient Hospital Stay: Payer: No Typology Code available for payment source

## 2020-09-08 DIAGNOSIS — F4310 Post-traumatic stress disorder, unspecified: Secondary | ICD-10-CM | POA: Diagnosis present

## 2020-09-08 DIAGNOSIS — R55 Syncope and collapse: Secondary | ICD-10-CM | POA: Diagnosis present

## 2020-09-08 DIAGNOSIS — Z800 Family history of malignant neoplasm of digestive organs: Secondary | ICD-10-CM | POA: Diagnosis not present

## 2020-09-08 DIAGNOSIS — E876 Hypokalemia: Secondary | ICD-10-CM

## 2020-09-08 DIAGNOSIS — C3490 Malignant neoplasm of unspecified part of unspecified bronchus or lung: Secondary | ICD-10-CM | POA: Diagnosis present

## 2020-09-08 DIAGNOSIS — I429 Cardiomyopathy, unspecified: Secondary | ICD-10-CM | POA: Diagnosis not present

## 2020-09-08 DIAGNOSIS — Z20822 Contact with and (suspected) exposure to covid-19: Secondary | ICD-10-CM | POA: Diagnosis present

## 2020-09-08 DIAGNOSIS — I472 Ventricular tachycardia, unspecified: Secondary | ICD-10-CM

## 2020-09-08 DIAGNOSIS — N390 Urinary tract infection, site not specified: Secondary | ICD-10-CM | POA: Diagnosis not present

## 2020-09-08 DIAGNOSIS — I428 Other cardiomyopathies: Secondary | ICD-10-CM | POA: Diagnosis present

## 2020-09-08 DIAGNOSIS — G20 Parkinson's disease: Secondary | ICD-10-CM

## 2020-09-08 DIAGNOSIS — Z8049 Family history of malignant neoplasm of other genital organs: Secondary | ICD-10-CM | POA: Diagnosis not present

## 2020-09-08 DIAGNOSIS — Z881 Allergy status to other antibiotic agents status: Secondary | ICD-10-CM | POA: Diagnosis not present

## 2020-09-08 DIAGNOSIS — Z833 Family history of diabetes mellitus: Secondary | ICD-10-CM | POA: Diagnosis not present

## 2020-09-08 DIAGNOSIS — I358 Other nonrheumatic aortic valve disorders: Secondary | ICD-10-CM | POA: Diagnosis present

## 2020-09-08 DIAGNOSIS — Z87891 Personal history of nicotine dependence: Secondary | ICD-10-CM | POA: Diagnosis not present

## 2020-09-08 DIAGNOSIS — Z85118 Personal history of other malignant neoplasm of bronchus and lung: Secondary | ICD-10-CM | POA: Diagnosis not present

## 2020-09-08 DIAGNOSIS — E860 Dehydration: Secondary | ICD-10-CM

## 2020-09-08 DIAGNOSIS — C3410 Malignant neoplasm of upper lobe, unspecified bronchus or lung: Secondary | ICD-10-CM | POA: Insufficient documentation

## 2020-09-08 DIAGNOSIS — G4700 Insomnia, unspecified: Secondary | ICD-10-CM | POA: Diagnosis present

## 2020-09-08 DIAGNOSIS — N529 Male erectile dysfunction, unspecified: Secondary | ICD-10-CM | POA: Insufficient documentation

## 2020-09-08 DIAGNOSIS — R9431 Abnormal electrocardiogram [ECG] [EKG]: Secondary | ICD-10-CM | POA: Diagnosis not present

## 2020-09-08 DIAGNOSIS — I2510 Atherosclerotic heart disease of native coronary artery without angina pectoris: Secondary | ICD-10-CM | POA: Diagnosis not present

## 2020-09-08 DIAGNOSIS — Z825 Family history of asthma and other chronic lower respiratory diseases: Secondary | ICD-10-CM | POA: Diagnosis not present

## 2020-09-08 DIAGNOSIS — R531 Weakness: Secondary | ICD-10-CM | POA: Diagnosis not present

## 2020-09-08 DIAGNOSIS — Z66 Do not resuscitate: Secondary | ICD-10-CM | POA: Diagnosis present

## 2020-09-08 DIAGNOSIS — Z8616 Personal history of COVID-19: Secondary | ICD-10-CM | POA: Diagnosis not present

## 2020-09-08 DIAGNOSIS — F32A Depression, unspecified: Secondary | ICD-10-CM | POA: Diagnosis present

## 2020-09-08 DIAGNOSIS — I5022 Chronic systolic (congestive) heart failure: Secondary | ICD-10-CM | POA: Diagnosis present

## 2020-09-08 DIAGNOSIS — Z79899 Other long term (current) drug therapy: Secondary | ICD-10-CM | POA: Diagnosis not present

## 2020-09-08 DIAGNOSIS — Z85828 Personal history of other malignant neoplasm of skin: Secondary | ICD-10-CM | POA: Diagnosis not present

## 2020-09-08 DIAGNOSIS — E43 Unspecified severe protein-calorie malnutrition: Secondary | ICD-10-CM | POA: Diagnosis present

## 2020-09-08 DIAGNOSIS — U071 COVID-19: Secondary | ICD-10-CM | POA: Diagnosis not present

## 2020-09-08 DIAGNOSIS — R64 Cachexia: Secondary | ICD-10-CM | POA: Diagnosis present

## 2020-09-08 LAB — BASIC METABOLIC PANEL
Anion gap: 11 (ref 5–15)
Anion gap: 9 (ref 5–15)
Anion gap: 8 (ref 5–15)
Anion gap: 8 (ref 5–15)
BUN: 15 mg/dL (ref 8–23)
BUN: 14 mg/dL (ref 8–23)
BUN: 14 mg/dL (ref 8–23)
BUN: 11 mg/dL (ref 8–23)
CO2: 27 mmol/L (ref 22–32)
CO2: 25 mmol/L (ref 22–32)
CO2: 25 mmol/L (ref 22–32)
CO2: 24 mmol/L (ref 22–32)
Calcium: 7.6 mg/dL — ABNORMAL LOW (ref 8.9–10.3)
Calcium: 7.2 mg/dL — ABNORMAL LOW (ref 8.9–10.3)
Calcium: 7.8 mg/dL — ABNORMAL LOW (ref 8.9–10.3)
Calcium: 7.8 mg/dL — ABNORMAL LOW (ref 8.9–10.3)
Chloride: 99 mmol/L (ref 98–111)
Chloride: 96 mmol/L — ABNORMAL LOW (ref 98–111)
Chloride: 103 mmol/L (ref 98–111)
Chloride: 104 mmol/L (ref 98–111)
Creatinine, Ser: 0.96 mg/dL (ref 0.61–1.24)
Creatinine, Ser: 0.98 mg/dL (ref 0.61–1.24)
Creatinine, Ser: 0.88 mg/dL (ref 0.61–1.24)
Creatinine, Ser: 0.74 mg/dL (ref 0.61–1.24)
GFR, Estimated: >60 mL/min (ref >60)
GFR, Estimated: >60 mL/min (ref >60)
GFR, Estimated: >60 mL/min (ref >60)
GFR, Estimated: >60 mL/min (ref >60)
Glucose, Bld: 83 mg/dL (ref 70–99)
Glucose, Bld: 315 mg/dL — ABNORMAL HIGH (ref 70–99)
Glucose, Bld: 89 mg/dL (ref 70–99)
Glucose, Bld: 86 mg/dL (ref 70–99)
Potassium: 2.6 mmol/L — CL (ref 3.5–5.1)
Potassium: 2.5 mmol/L — CL (ref 3.5–5.1)
Potassium: 3.4 mmol/L — ABNORMAL LOW (ref 3.5–5.1)
Potassium: 3.4 mmol/L — ABNORMAL LOW (ref 3.5–5.1)
Sodium: 137 mmol/L (ref 135–145)
Sodium: 130 mmol/L — ABNORMAL LOW (ref 135–145)
Sodium: 136 mmol/L (ref 135–145)
Sodium: 136 mmol/L (ref 135–145)

## 2020-09-08 LAB — URINALYSIS, COMPLETE (UACMP) WITH MICROSCOPIC
Bilirubin Urine: NEGATIVE
Glucose, UA: NEGATIVE mg/dL
Hgb urine dipstick: NEGATIVE
Ketones, ur: 80 mg/dL — AB
Nitrite: POSITIVE — AB
Protein, ur: 30 mg/dL — AB
Specific Gravity, Urine: 1.024 (ref 1.005–1.030)
Squamous Epithelial / HPF: NONE SEEN (ref 0–5)
WBC, UA: >50 WBC/hpf — ABNORMAL HIGH (ref 0–5)
pH: 6 (ref 5.0–8.0)

## 2020-09-08 LAB — CBC
HCT: 33.3 % — ABNORMAL LOW (ref 39.0–52.0)
Hemoglobin: 11.7 g/dL — ABNORMAL LOW (ref 13.0–17.0)
MCH: 29.4 pg (ref 26.0–34.0)
MCHC: 35.1 g/dL (ref 30.0–36.0)
MCV: 83.7 fL (ref 80.0–100.0)
Platelets: 217 10*3/uL (ref 150–400)
RBC: 3.98 MIL/uL — ABNORMAL LOW (ref 4.22–5.81)
RDW: 14.1 % (ref 11.5–15.5)
WBC: 8.7 10*3/uL (ref 4.0–10.5)
nRBC: 0 % (ref 0.0–0.2)

## 2020-09-08 LAB — LIPID PANEL
Cholesterol: 109 mg/dL (ref 0–200)
HDL: 29 mg/dL — ABNORMAL LOW (ref >40)
LDL Cholesterol: 69 mg/dL (ref 0–99)
Total CHOL/HDL Ratio: 3.8 RATIO
Triglycerides: 53 mg/dL (ref <150)
VLDL: 11 mg/dL (ref 0–40)

## 2020-09-08 LAB — CREATININE, SERUM
Creatinine, Ser: 0.98 mg/dL (ref 0.61–1.24)
GFR, Estimated: >60 mL/min (ref >60)

## 2020-09-08 LAB — TSH: TSH: 3.265 u[IU]/mL (ref 0.350–4.500)

## 2020-09-08 LAB — RESP PANEL BY RT-PCR (FLU A&B, COVID) ARPGX2
Influenza A by PCR: NEGATIVE
Influenza B by PCR: NEGATIVE
SARS Coronavirus 2 by RT PCR: POSITIVE — AB

## 2020-09-08 LAB — PHOSPHORUS: Phosphorus: 3.2 mg/dL (ref 2.5–4.6)

## 2020-09-08 LAB — MRSA PCR SCREENING: MRSA by PCR: NEGATIVE

## 2020-09-08 LAB — TROPONIN I (HIGH SENSITIVITY)
Troponin I (High Sensitivity): 9 ng/L (ref <18)
Troponin I (High Sensitivity): 8 ng/L (ref <18)

## 2020-09-08 LAB — MAGNESIUM: Magnesium: 2.3 mg/dL (ref 1.7–2.4)

## 2020-09-08 LAB — GLUCOSE, CAPILLARY: Glucose-Capillary: 81 mg/dL (ref 70–99)

## 2020-09-08 MED ORDER — SODIUM CHLORIDE 0.9 % IV SOLN: INTRAVENOUS | Status: DC

## 2020-09-08 MED ORDER — SODIUM CHLORIDE 0.9% FLUSH
3.0000 mL | Freq: Two times a day (BID) | INTRAVENOUS | Status: DC
  Administered 2020-09-08 – 2020-09-13 (×10): 3 mL via INTRAVENOUS

## 2020-09-08 MED ORDER — ALUM & MAG HYDROXIDE-SIMETH 200-200-20 MG/5ML PO SUSP: 30.0000 mL | Freq: Four times a day (QID) | ORAL | Status: DC | PRN

## 2020-09-08 MED ORDER — ENOXAPARIN SODIUM 40 MG/0.4ML IJ SOSY
40.0000 mg | PREFILLED_SYRINGE | INTRAMUSCULAR | Status: DC
  Administered 2020-09-08 – 2020-09-10 (×3): 40 mg via SUBCUTANEOUS
  Filled 2020-09-08 (×4): qty 0.4

## 2020-09-08 MED ORDER — POTASSIUM CHLORIDE 20 MEQ PO PACK
20.0000 meq | PACK | Freq: Once | ORAL | Status: AC
  Administered 2020-09-08: 20 meq via ORAL
  Filled 2020-09-08: qty 1

## 2020-09-08 MED ORDER — BUPROPION HCL ER (XL) 300 MG PO TB24
300.0000 mg | ORAL_TABLET | Freq: Every day | ORAL | Status: DC
  Administered 2020-09-08 – 2020-09-13 (×6): 300 mg via ORAL
  Filled 2020-09-08 (×6): qty 1

## 2020-09-08 MED ORDER — POTASSIUM CHLORIDE 10 MEQ/100ML IV SOLN
10.0000 meq | INTRAVENOUS | Status: AC
  Administered 2020-09-08 (×3): 10 meq via INTRAVENOUS
  Filled 2020-09-08 (×4): qty 100

## 2020-09-08 MED ORDER — CYPROHEPTADINE HCL 4 MG PO TABS
4.0000 mg | ORAL_TABLET | Freq: Every day | ORAL | Status: DC
  Administered 2020-09-09 – 2020-09-13 (×5): 4 mg via ORAL
  Filled 2020-09-08 (×6): qty 1

## 2020-09-08 MED ORDER — POTASSIUM CHLORIDE 20 MEQ PO PACK: 20.0000 meq | PACK | ORAL | Status: DC

## 2020-09-08 MED ORDER — LAMOTRIGINE 25 MG PO TABS
100.0000 mg | ORAL_TABLET | Freq: Every day | ORAL | Status: DC
  Administered 2020-09-09 – 2020-09-10 (×2): 100 mg via ORAL
  Filled 2020-09-08 (×2): qty 4

## 2020-09-08 MED ORDER — SENNOSIDES-DOCUSATE SODIUM 8.6-50 MG PO TABS: 1.0000 | ORAL_TABLET | Freq: Every evening | ORAL | Status: DC | PRN

## 2020-09-08 MED ORDER — MAGNESIUM CITRATE PO SOLN
1.0000 | Freq: Once | ORAL | Status: DC | PRN
  Filled 2020-09-08: qty 296

## 2020-09-08 MED ORDER — ESCITALOPRAM OXALATE 20 MG PO TABS
20.0000 mg | ORAL_TABLET | Freq: Every day | ORAL | Status: DC
  Filled 2020-09-08 (×2): qty 1

## 2020-09-08 MED ORDER — POTASSIUM CHLORIDE 10 MEQ/100ML IV SOLN
10.0000 meq | INTRAVENOUS | Status: DC
  Filled 2020-09-08 (×6): qty 100

## 2020-09-08 MED ORDER — CARBIDOPA-LEVODOPA 25-100 MG PO TABS
3.0000 | ORAL_TABLET | Freq: Three times a day (TID) | ORAL | Status: DC
  Administered 2020-09-08 – 2020-09-13 (×14): 3 via ORAL
  Filled 2020-09-08 (×18): qty 3

## 2020-09-08 MED ORDER — BUPROPION HCL ER (XL) 450 MG PO TB24: 450.0000 mg | ORAL_TABLET | Freq: Every day | ORAL | Status: DC

## 2020-09-08 MED ORDER — ACETAMINOPHEN 325 MG PO TABS
650.0000 mg | ORAL_TABLET | Freq: Four times a day (QID) | ORAL | Status: DC | PRN
  Administered 2020-09-09: 650 mg via ORAL
  Filled 2020-09-08: qty 2

## 2020-09-08 MED ORDER — MELATONIN 5 MG PO TABS
5.0000 mg | ORAL_TABLET | Freq: Every day | ORAL | Status: DC
  Administered 2020-09-08 – 2020-09-12 (×5): 5 mg via ORAL
  Filled 2020-09-08 (×5): qty 1

## 2020-09-08 MED ORDER — BISACODYL 5 MG PO TBEC: 5.0000 mg | DELAYED_RELEASE_TABLET | Freq: Every day | ORAL | Status: DC | PRN

## 2020-09-08 MED ORDER — SODIUM CHLORIDE 0.9 % IV SOLN
1.0000 g | INTRAVENOUS | Status: DC
  Administered 2020-09-08 – 2020-09-09 (×2): 1 g via INTRAVENOUS
  Filled 2020-09-08 (×3): qty 10

## 2020-09-08 MED ORDER — POTASSIUM CHLORIDE 20 MEQ PO PACK
60.0000 meq | PACK | Freq: Two times a day (BID) | ORAL | Status: DC
  Administered 2020-09-08 (×2): 60 meq via ORAL
  Filled 2020-09-08 (×2): qty 3

## 2020-09-08 MED ORDER — ACETAMINOPHEN 650 MG RE SUPP: 650.0000 mg | Freq: Four times a day (QID) | RECTAL | Status: DC | PRN

## 2020-09-08 MED ORDER — CHLORHEXIDINE GLUCONATE CLOTH 2 % EX PADS
6.0000 | MEDICATED_PAD | Freq: Every day | CUTANEOUS | Status: DC
  Administered 2020-09-08 – 2020-09-11 (×4): 6 via TOPICAL

## 2020-09-08 MED ORDER — LIDOCAINE IN D5W 4-5 MG/ML-% IV SOLN
1.0000 mg/min | INTRAVENOUS | Status: DC
  Administered 2020-09-08: 3 mg/min via INTRAVENOUS
  Administered 2020-09-08: 2 mg/min via INTRAVENOUS
  Administered 2020-09-09: 3 mg/min via INTRAVENOUS
  Administered 2020-09-10: 1 mg/min via INTRAVENOUS
  Filled 2020-09-08 (×4): qty 500

## 2020-09-08 MED ORDER — RISAQUAD PO CAPS
2.0000 | ORAL_CAPSULE | Freq: Three times a day (TID) | ORAL | Status: DC
  Administered 2020-09-08 – 2020-09-13 (×14): 2 via ORAL
  Filled 2020-09-08 (×18): qty 2

## 2020-09-08 MED ORDER — TRAZODONE HCL 100 MG PO TABS: 100.0000 mg | ORAL_TABLET | Freq: Every day | ORAL | Status: DC

## 2020-09-08 MED ORDER — FLUTICASONE PROPIONATE 50 MCG/ACT NA SUSP
2.0000 | Freq: Every day | NASAL | Status: DC
  Administered 2020-09-09 – 2020-09-13 (×5): 2 via NASAL
  Filled 2020-09-08: qty 16

## 2020-09-08 MED ORDER — DONEPEZIL HCL 5 MG PO TABS
10.0000 mg | ORAL_TABLET | Freq: Every day | ORAL | Status: DC
  Administered 2020-09-08: 10 mg via ORAL
  Filled 2020-09-08 (×2): qty 2

## 2020-09-08 MED ORDER — POTASSIUM CHLORIDE IN NACL 40-0.9 MEQ/L-% IV SOLN
INTRAVENOUS | Status: DC
  Filled 2020-09-08 (×5): qty 1000

## 2020-09-08 MED ORDER — POTASSIUM CHLORIDE 10 MEQ/100ML IV SOLN
10.0000 meq | INTRAVENOUS | Status: DC
Start: 2020-09-08 — End: 2020-09-08

## 2020-09-08 MED ORDER — LIDOCAINE HCL (CARDIAC) PF 100 MG/5ML IV SOSY
PREFILLED_SYRINGE | INTRAVENOUS | Status: AC
  Filled 2020-09-08: qty 5

## 2020-09-08 MED ORDER — LIDOCAINE HCL (CARDIAC) PF 100 MG/5ML IV SOSY
1.0000 mg/kg | PREFILLED_SYRINGE | Freq: Once | INTRAVENOUS | Status: AC
  Administered 2020-09-08: 70.6 mg via INTRAVENOUS
  Filled 2020-09-08: qty 5

## 2020-09-08 MED ORDER — POLYETHYLENE GLYCOL 3350 17 G PO PACK: 17.0000 g | PACK | ORAL | Status: DC

## 2020-09-08 NOTE — Progress Notes (Signed)
Pharmacy Consult: patient with prolonged EKG, ventricular tachycardia, review of home meds:  Review of patient's home medications as listed in Med Rec (to be updated).  medications that can cause prolongation of QT interval: -trazodone -escitalopram -donepezil  Medications that can cause tachycardia: -Bupropion -Cyproheptadine   Chinita Greenland PharmD Clinical Pharmacist 09/08/2020

## 2020-09-08 NOTE — Progress Notes (Addendum)
PROGRESS NOTE    Darren Thomas.  YQI:[id number] DOB: 1945-10-15 DOA: 09/07/2020 PCP: Steele Sizer, MD  [facility]    Brief Narrative:   Darren Thomas is a 75 y.o. male with a known history of Parkinson's, lung C, depression/PTSD, COVID 3 weeks ago presents to the emergency department for evaluation of syncope.  Patient was in a usual state of health until recent COVID infection, he has not fully recovered his appetite, and has been having N, V/D  Patient sustained a syncope epiosde at home today while sitting in a chair.  Wife witnessed and denies seizure like activity.   Of note, he has had three episodes on nonsustained VT in the ER associated ith dizziness and lightheadedness, but no additional syncope.  All of his prior care has been at the [place].   Patient denies fevers/chills, weakness, dizziness, chest pain, shortness of breath abdominal pain, dysuria/frequency, changes in mental status.    Otherwise there has been no change in status. Patient has been taking medication as prescribed and there has been no recent change in medication or diet.  No recent antibiotics.  There has been no recent illness, hospitalizations, travel or sick contacts.      Assessment & Plan:   Active Problems:   Ventricular tachycardia ([place])   #. Syncope 2/2 VT Severe hypokalemia likely contributory, immunotherapy may also contribute - cardiology and critical care consulted, admitted to step-down - lidocaine gtt running, difibrillator pads in place - treating hypokalemia as below - TTE ordered - DNR  # Hypokalemia 2/2 recent weeks of decreased PO, nausea, diarrhea. K 2.6. Mg wnl. Has received 50 meq IV K with another 50 set to run - bmp q6 - will also add 40 meq of kcl to IV fluids @ 100 - will discuss placement of central line w/ ICU team to facilitate more rapid correction of hypokalemia  # Nausea, vomiting, diarrhea Likely 2/2 lung cancer and its treatment - will check  gi pathogen panel. Holding on c diff as no recent abx, fever, or white count. No sig abd pain/tenderness so holding on scan for now  #. UTI, symptomatic - Follow up urine culture - WIll start IV Rocephin  # Covid positive Appears asymptomatic. Symptoms 4/15, diagnosed 4/17. - monitor - airborne and contact precautions  # prolonged qtc In the 600s - avoid qtc prolongers  #. History of Parkinsons - Continue Sinemet, aricept  #. History of depression/PTSD - Continue Bupropion, Lexapo, Lamictal  # Lung cancer Followed at [place], on [place] every 6 wks, last dose early April  # Goals of care - will plan on involving palliative at some point. Pt was on hospice last year, taken off, but this appears to represent a significant decline   DVT prophylaxis: lovenox Code Status: dnr Family Communication: wife updated @ bedside 4/30  Level of care: Stepdown Status is: Inpatient  Remains inpatient appropriate because:Inpatient level of care appropriate due to severity of illness   Dispo: The patient is from: Home              Anticipated d/c is to: tbd              Patient currently is not medically stable to d/c.   Difficult to place patient No        Consultants:  Critical care, cardiology  Procedures: none  Antimicrobials:  Ceftriaxone 4/29>    Subjective: This morning feels fatigued, no chest pain, no syncope since arriving  Objective:  Vitals:   09/08/20 0400 09/08/20 0530 09/08/20 0700 09/08/20 0715  BP: (!) 141/67 128/61 (!) 146/61   Pulse: (!) 55 (!) 53 (!) 58 (!) 48  Resp: 10 13 17 13   Temp:      TempSrc:      SpO2: 100% 100% 100% 99%  Weight:      Height:        Intake/Output Summary (Last 24 hours) at 09/08/2020 0741 Last data filed at 09/08/2020 0631 Gross per 24 hour  Intake 2600.87 ml  Output --  Net 2600.87 ml   Filed Weights   09/07/20 2213  Weight: 70.6 kg    Examination:  General exam: Appears calm, frail Respiratory system:  Clear to auscultation save for faint rales at bases Cardiovascular system: S1 & S2 heard, bradycardic, soft systolic murmur Gastrointestinal system: Abdomen is nondistended, soft and nontender. No organomegaly or masses felt. Normal bowel sounds heard. Central nervous system: Alert and oriented. No focal neurological deficits. Extremities: trace LE edema Skin: No rashes, lesions or ulcers Psychiatry: Judgement and insight appear normal. Appears depressed     Data Reviewed: I have personally reviewed following labs and imaging studies  CBC: Recent Labs  Lab 09/07/20 2255 09/08/20 0533  WBC 9.4 8.7  HGB 13.4 11.7*  HCT 37.7* 33.3*  MCV 82.1 83.7  PLT 284 161   Basic Metabolic Panel: Recent Labs  Lab 09/07/20 2255 09/08/20 0533  NA 137 137  K 2.6* 2.6*  CL 98 99  CO2 29 27  GLUCOSE 112* 83  BUN 19 15  CREATININE 0.84 0.98  0.96  CALCIUM 8.4* 7.6*  MG 1.9 2.3  PHOS  --  3.2   GFR: Estimated Creatinine Clearance: 63 mL/min (by C-G formula based on SCr of 0.98 mg/dL). Liver Function Tests: Recent Labs  Lab 09/07/20 2255  AST 21  ALT 9  ALKPHOS 60  BILITOT 1.0  PROT 6.0*  ALBUMIN 3.3*   No results for input(s): LIPASE, AMYLASE in the last 168 hours. No results for input(s): AMMONIA in the last 168 hours. Coagulation Profile: No results for input(s): INR, PROTIME in the last 168 hours. Cardiac Enzymes: Recent Labs  Lab 09/07/20 2255  CKTOTAL 166   BNP (last 3 results) No results for input(s): PROBNP in the last 8760 hours. HbA1C: No results for input(s): HGBA1C in the last 72 hours. CBG: No results for input(s): GLUCAP in the last 168 hours. Lipid Profile: Recent Labs    09/08/20 0533  CHOL 109  HDL 29*  LDLCALC 69  TRIG 53  CHOLHDL 3.8   Thyroid Function Tests: Recent Labs    09/08/20 0533  TSH 3.265   Anemia Panel: No results for input(s): VITAMINB12, FOLATE, FERRITIN, TIBC, IRON, RETICCTPCT in the last 72 hours. Urine analysis:     Component Value Date/Time   COLORURINE AMBER (A) 09/07/2020 2255   APPEARANCEUR CLOUDY (A) 09/07/2020 2255   LABSPEC 1.024 09/07/2020 2255   PHURINE 6.0 09/07/2020 2255   GLUCOSEU NEGATIVE 09/07/2020 2255   HGBUR NEGATIVE 09/07/2020 2255   BILIRUBINUR NEGATIVE 09/07/2020 2255   KETONESUR 80 (A) 09/07/2020 2255   PROTEINUR 30 (A) 09/07/2020 2255   NITRITE POSITIVE (A) 09/07/2020 2255   LEUKOCYTESUR SMALL (A) 09/07/2020 2255   Sepsis Labs: @LABRCNTIP (procalcitonin:4,lacticidven:4)  ) Recent Results (from the past 240 hour(s))  Resp Panel by RT-PCR (Flu A&B, Covid) Nasopharyngeal Swab     Status: Abnormal   Collection Time: 09/08/20 12:58 AM   Specimen: Nasopharyngeal Swab; Nasopharyngeal(NP) swabs  in vial transport medium  Result Value Ref Range Status   SARS Coronavirus 2 by RT PCR POSITIVE (A) NEGATIVE Final    Comment: CRITICAL RESULT CALLED TO, READ BACK BY AND VERIFIED WITH: JUAN HENDERSON AT 4481 09/08/20 MF (NOTE) SARS-CoV-2 target nucleic acids are DETECTED.  The SARS-CoV-2 RNA is generally detectable in upper respiratory specimens during the acute phase of infection. Positive results are indicative of the presence of the identified virus, but do not rule out bacterial infection or co-infection with other pathogens not detected by the test. Clinical correlation with patient history and other diagnostic information is necessary to determine patient infection status. The expected result is Negative.  Fact Sheet for Patients: EntrepreneurPulse.com.au  Fact Sheet for Healthcare Providers: IncredibleEmployment.be  This test is not yet approved or cleared by the [place] FDA and  has been authorized for detection and/or diagnosis of SARS-CoV-2 by FDA under an Emergency Use Authorization (EUA).  This EUA will remain in effect (meaning this te st can be used) for the duration of  the COVID-19 declaration under Section 564(b)(1) of  the Act, 21 U.S.C. section 360bbb-3(b)(1), unless the authorization is terminated or revoked sooner.     Influenza A by PCR NEGATIVE NEGATIVE Final   Influenza B by PCR NEGATIVE NEGATIVE Final    Comment: (NOTE) The Xpert Xpress SARS-CoV-2/FLU/RSV plus assay is intended as an aid in the diagnosis of influenza from Nasopharyngeal swab specimens and should not be used as a sole basis for treatment. Nasal washings and aspirates are unacceptable for Xpert Xpress SARS-CoV-2/FLU/RSV testing.  Fact Sheet for Patients: EntrepreneurPulse.com.au  Fact Sheet for Healthcare Providers: IncredibleEmployment.be  This test is not yet approved or cleared by the [place] FDA and has been authorized for detection and/or diagnosis of SARS-CoV-2 by FDA under an Emergency Use Authorization (EUA). This EUA will remain in effect (meaning this test can be used) for the duration of the COVID-19 declaration under Section 564(b)(1) of the Act, 21 U.S.C. section 360bbb-3(b)(1), unless the authorization is terminated or revoked.  Performed at [facility], [address] [place], [place] [address]          Radiology Studies: DG Chest 1 View  Result Date: 09/08/2020 CLINICAL DATA:  Initial evaluation for acute syncope. EXAM: CHEST  1 VIEW COMPARISON:  Prior radiograph from 06/15/2019. FINDINGS: Cardiac and mediastinal silhouettes are stable, and remain within normal limits. Aortic atherosclerosis. Lungs normally inflated. Scattered linear subsegmental atelectasis and/or scarring present at the bilateral lung bases. No focal infiltrates. No edema or effusion. No pneumothorax. Different related pads overlie the left lower chest. No acute osseous finding. Postsurgical changes partially visualize within the cervical spine. IMPRESSION: 1. Mild bibasilar linear subsegmental atelectasis and/or scarring. 2. No other active cardiopulmonary disease. 3.  Aortic  Atherosclerosis (ICD10-I70.0). Electronically Signed   By: Jeannine Boga M.D.   On: 09/08/2020 03:13        Scheduled Meds: . buPROPion HCl ER (XL)  450 mg Oral Daily  . carbidopa-levodopa  3 tablet Oral TID  . cyproheptadine  4 mg Oral Daily  . donepezil  10 mg Oral QHS  . enoxaparin (LOVENOX) injection  40 mg Subcutaneous Q24H  . escitalopram  20 mg Oral Daily  . fluticasone  2 spray Each Nare Daily  . lactobacillus acidophilus  2 tablet Oral TID  . lamoTRIgine  100 mg Oral Daily  . lidocaine (cardiac) 100 mg/53mL      . polyethylene glycol  17 g Oral QODAY  .  sodium chloride flush  3 mL Intravenous Q12H   Continuous Infusions: . sodium chloride 75 mL/hr at 09/08/20 0342  . cefTRIAXone (ROCEPHIN)  IV Stopped (09/08/20 0413)  . lidocaine 2 mg/min (09/08/20 0238)  . potassium chloride       LOS: 0 days    Time spent: 45 min    Desma Maxim, MD [facility]   If 7PM-7AM, please contact night-coverage [email or web address] Password [facility] 09/08/2020, 7:41 AM

## 2020-09-08 NOTE — Progress Notes (Signed)
Goals of Care  The Clinical status was relayed to the patient and wife in detail. We discussed the current plan of care and interventions. We talked about outpatient immunotherapy treatment and previous experience with hospice. We discussed his recent weight loss and deconditioning, as well as his difficulty swallowing food due to weakened laryngeal muscles- per patient & wife. We discussed concerns about how well the patient might tolerate CPR and intubation. Both the patient and wife understands the situation. They agree that he would not want to be placed on a ventilator for any reason. And if his heart were to stop he would want to be comfortable and let God take him.  They have consented and agreed to DNR/DNI.   Domingo Pulse [place], AGACNP-BC [facility]    Please see Amion for pager details.

## 2020-09-08 NOTE — H&P (Addendum)
History and Physical   [facility] [place] [facility] Admission History and Physical [employer], D.O.    Patient Name: Darren Thomas MR#: [id number] Date of Birth: 06-07-1945 Date of Admission: 09/07/2020  Referring MD/NP/PA: Dr. Kerman Passey Primary Care Physician: Steele Sizer, MD  Chief Complaint:  Chief Complaint  Patient presents with  . Loss of Consciousness    Pt arrives to ED from home. Reported to have syncopal episode while sitting in recliner. Pt states he got "real hot & then just went out of it" Patient A/O x 3 upon ems arrival. Hx of Parkinson's & lung ca    HPI: Darren Thomas is a 75 y.o. male with a known history of Parkinson's, lung C, depression/PTSD, COVID 3 weeks ago presents to the emergency department for evaluation of syncope.  Patient was in a usual state of health until recent COVID infection, he has not fully recovered his appetite, and has been having N, V/D  Patient sustained a syncope epiosde at home today while sitting in a chair.  Wife witnessed and denies seizure like activity.   Of note, he has had three episodes on nonsustained VT in the ER associated ith dizziness and lightheadedness, but no additional syncope.  All of his prior care has been at the [place].   Patient denies fevers/chills, weakness, dizziness, chest pain, shortness of breath abdominal pain, dysuria/frequency, changes in mental status.    Otherwise there has been no change in status. Patient has been taking medication as prescribed and there has been no recent change in medication or diet.  No recent antibiotics.  There has been no recent illness, hospitalizations, travel or sick contacts.    EMS/ED Course: Patient received magnesium, KCl, NS, amiodarone bolus and drip. Medical admission has been requested for further management of syncope likely 2/2 nonsustained VT, electrolyte derangement.   Review of Systems:  CONSTITUTIONAL: Positive fatigue, No fever/chills, fatigue,  weakness, weight gain/loss, headache. EYES: No blurry or double vision. ENT: No tinnitus, postnasal drip, redness or soreness of the oropharynx. RESPIRATORY: No cough, dyspnea, wheeze.  No hemoptysis.  CARDIOVASCULAR: No chest pain, palpitations, syncope, orthopnea. No lower extremity edema.  GASTROINTESTINAL: Positive decreased appetite,  nausea, vomiting, negative abdominal pain, diarrhea, constipation.  No hematemesis, melena or hematochezia. GENITOURINARY: No dysuria, frequency, hematuria. ENDOCRINE: No polyuria or nocturia. No heat or cold intolerance. HEMATOLOGY: No anemia, bruising, bleeding. INTEGUMENTARY: No rashes, ulcers, lesions. MUSCULOSKELETAL: No arthritis, gout, dyspnea. NEUROLOGIC: No numbness, tingling, ataxia, seizure-type activity, weakness. PSYCHIATRIC: No anxiety, depression, insomnia.   Past Medical History:  Diagnosis Date  . Abnormality of gait   . Back pain   . Cancer ([place]) 04/2019   stage IV lung cancer being treated at [facility], left hip muscle, stomach, top of small intesting, liver and lymphnode near lungs  . Cervical spondylosis with myelopathy 01/06/2014  . Depression   . Insomnia   . Parkinson disease ([place]) 04/2018   diagnosed by neurology at [facility]  . PTSD (post-traumatic stress disorder)   . PTSD (post-traumatic stress disorder)     Past Surgical History:  Procedure Laterality Date  . C-spine     C-3-7 removed  . SCALP LESION REMOVAL W/ FLAP AND SKIN GRAFT    . SKIN CANCER EXCISION Left    squamos cell cancinoma     reports that he quit smoking about 49 years ago. His smoking use included cigarettes. He has a 20.00 pack-year smoking history. He has never used smokeless tobacco. He reports that he  does not drink alcohol and does not use drugs.  Allergies  Allergen Reactions  . Augmentin [Amoxicillin-Pot Clavulanate] Diarrhea    Family History  Problem Relation Age of Onset  . Cervical cancer Mother   . Heart Problems Father   . Diabetes  Father   . Asthma Brother   . Emphysema Brother        was a smoker  . Diabetes type I Son   . Colon cancer Son 31    Prior to Admission medications   Medication Sig Start Date End Date Taking? Authorizing Provider  buPROPion HCl ER, XL, 450 MG TB24 Take 450 mg by mouth daily. Pt taking 300mg  daily 12/10/17   [provider]  carbidopa-levodopa (SINEMET IR) 25-100 MG tablet Take 3 tablets by mouth 3 (three) times daily.     [provider]  cyproheptadine (PERIACTIN) 4 MG tablet Take 4 mg by mouth daily.  04/30/19   [provider]  donepezil (ARICEPT) 10 MG tablet Take 10 mg by mouth at bedtime.    [provider]  escitalopram (LEXAPRO) 20 MG tablet Take 20 mg by mouth daily. 10/17/16   [provider]  fluticasone (FLONASE) 50 MCG/ACT nasal spray USE 2 SPRAYS IN EACH NOSTRIL EVERY DAY 11/12/19   Steele Sizer, MD  lamoTRIgine (LAMICTAL) 100 MG tablet 1 tablet daily. 02/14/18   [provider]  lidocaine (XYLOCAINE) 2 % solution Use as directed 15 mLs in the mouth or throat as needed for mouth pain. 06/15/19   Rudene Re, MD  Multiple Vitamins-Minerals (COMPLETE MULTIVITAMIN/MINERAL PO) Take 1 tablet by mouth daily. 12/20/13   [provider]  Multiple Vitamins-Minerals (PRESERVISION AREDS 2 PO) Take by mouth.    [provider]  pembrolizumab (KEYTRUDA) 100 MG/4ML SOLN Inject 2 mg/kg into the vein. Immunotherapy treatments every 6 weeks at [facility], Historical, MD  polyethylene glycol (MIRALAX / GLYCOLAX) packet Take 17 g by mouth every other day.    [provider]  traZODone (DESYREL) 100 MG tablet Take 1 tablet by mouth at bedtime. 02/03/19   [provider]    Physical Exam: Vitals:   09/07/20 2206 09/07/20 2213 09/07/20 2300  BP: (!) 111/59  118/71  Pulse: 65  69  Resp: 18  11  Temp: (!) 97.3 F (36.3 C)    TempSrc: Oral    SpO2: 100%  100%  Weight:  70.6 kg   Height:  6'  2" (1.88 m)     GENERAL: 75 y.o.-year-old WM patient, well-developed, well-nourished lying in the bed in no acute distress.  Pleasant and cooperative.   HEENT: Head atraumatic, normocephalic. Pupils equal. Mucus membranes moist. NECK: Supple. No JVD. CHEST: Normal breath sounds bilaterally. No wheezing, rales, rhonchi or crackles. No use of accessory muscles of respiration.  No reproducible chest wall tenderness.  CARDIOVASCULAR: S1, S2 normal. No murmurs, rubs, or gallops. Cap refill <2 seconds. Pulses intact distally.  ABDOMEN: Soft, nondistended, nontender. No rebound, guarding, rigidity. Normoactive bowel sounds present in all four quadrants.  EXTREMITIES: No pedal edema, cyanosis, or clubbing. No calf tenderness or Homan's sign.  NEUROLOGIC: The patient is alert and oriented x 3. Cranial nerves II through XII are grossly intact with no focal sensorimotor deficit. PSYCHIATRIC:  Normal affect, mood, thought content. SKIN: Warm, dry, and intact without obvious rash, lesion, or ulcer.    Labs on Admission:  CBC: Recent Labs  Lab 09/07/20 2255  WBC 9.4  HGB 13.4  HCT 37.7*  MCV 82.1  PLT 264   Basic Metabolic Panel: Recent Labs  Lab 09/07/20 2255  NA 137  K 2.6*  CL 98  CO2 29  GLUCOSE 112*  BUN 19  CREATININE 0.84  CALCIUM 8.4*  MG 1.9   GFR: Estimated Creatinine Clearance: 73.5 mL/min (by C-G formula based on SCr of 0.84 mg/dL). Liver Function Tests: Recent Labs  Lab 09/07/20 2255  AST 21  ALT 9  ALKPHOS 60  BILITOT 1.0  PROT 6.0*  ALBUMIN 3.3*   No results for input(s): LIPASE, AMYLASE in the last 168 hours. No results for input(s): AMMONIA in the last 168 hours. Coagulation Profile: No results for input(s): INR, PROTIME in the last 168 hours. Cardiac Enzymes: Recent Labs  Lab 09/07/20 2255  CKTOTAL 166   BNP (last 3 results) No results for input(s): PROBNP in the last 8760 hours. HbA1C: No results for input(s): HGBA1C in the last 72  hours. CBG: No results for input(s): GLUCAP in the last 168 hours. Lipid Profile: No results for input(s): CHOL, HDL, LDLCALC, TRIG, CHOLHDL, LDLDIRECT in the last 72 hours. Thyroid Function Tests: No results for input(s): TSH, T4TOTAL, FREET4, T3FREE, THYROIDAB in the last 72 hours. Anemia Panel: No results for input(s): VITAMINB12, FOLATE, FERRITIN, TIBC, IRON, RETICCTPCT in the last 72 hours. Urine analysis:    Component Value Date/Time   COLORURINE AMBER (A) 09/07/2020 2255   APPEARANCEUR CLOUDY (A) 09/07/2020 2255   LABSPEC 1.024 09/07/2020 2255   PHURINE 6.0 09/07/2020 2255   GLUCOSEU NEGATIVE 09/07/2020 2255   HGBUR NEGATIVE 09/07/2020 2255   BILIRUBINUR NEGATIVE 09/07/2020 2255   KETONESUR 80 (A) 09/07/2020 2255   PROTEINUR 30 (A) 09/07/2020 2255   NITRITE POSITIVE (A) 09/07/2020 2255   LEUKOCYTESUR SMALL (A) 09/07/2020 2255   Sepsis Labs: @LABRCNTIP (procalcitonin:4,lacticidven:4) )No results found for this or any previous visit (from the past 240 hour(s)).   Radiological Exams on Admission: No results found.  EKG: Normal sinus rhythm at 64 bpm with, widened QRS, Prolonged QTC normal axis and nonspecific ST-T wave changes.   Assessment/Plan  This is a 75 y.o. male with a history of Parkinson's, lung C, depression/PTSD, COVID 3 weeks ago  now being admitted with:  #. Syncope 2/2 VT - Admit inpatient with telemetry monitoring - IV fluid hydration - Continue amio drip - Check echo - Trend trops, check TSH, lipids - Will contact cardio in AM.   #. Electrolyte derangement -0 hypokalemia, hypomagnesemia 2/2 poor PO intake - Replaced in ER - Trend levels and replace as needed  #. UTI, asymptomatic - Follow up urine culture - WIll start IV Rocephin  #. History of Parkinsons - Continue Sinemet, aricept  #. History of depression/PTSD - Continue Bupropion, Lexapo, Lamictal  Admission status: IP, tele stepdown  IV Fluids: HL Diet/Nutrition: Heart  healthy Consults called: Cardio to be called in AM  DVT Px: Lovenox, SCDs and early ambulation. Code Status: Full Code  Disposition Plan: To home in 1-2 days  All the records are reviewed and case discussed with ED provider. Management plans discussed with the patient and/or family who express understanding and agree with plan of care.  Leilanny Fluitt D.O. on 09/08/2020 at 12:17 AM CC: Primary care physician; Steele Sizer, MD   09/08/2020, 12:17 AM

## 2020-09-08 NOTE — ED Notes (Addendum)
Dr Si Raider, attending physician, present at bedside. Informed of pt's heart rate.

## 2020-09-08 NOTE — ED Notes (Signed)
Attending provider messaged to inform them of vitals, see charted vitals.

## 2020-09-08 NOTE — Consult Note (Signed)
Cardiology Consultation:   Patient ID: Darren Thomas. MRN: [id number]; DOB: 1946/02/10  Admit date: 09/07/2020 Date of Consult: 09/08/2020  PCP:  Steele Sizer, MD   [place]  Cardiologist:  [place] rounding Advanced Practice Provider:  No care team member to display Electrophysiologist:  None        Patient Profile:   Darren Thomas. is a 75 y.o. male with a hx of Parkinson's disease, lung cancer who is being seen today for the evaluation of syncope, VT at the request of Dr Si Raider.  History of Present Illness:   Mr. Darren Thomas is a 75 year old male with history of Parkinson's disease, PTSD, stage IV lung cancer presenting with syncope.  Patient was at home when he suddenly passed out while seated in the recliner.  His wife noticed patient became unresponsive and called EMS.  Prior to this, patient had about 5-day history of nausea, low appetite, diarrhea.  States having episodes of palpitations and feelings of warmth in his head and neck associated with palpitations.  Denies any previous history of passing out.  Denies any previous history of heart disease.  Was diagnosed with COVID-19 infection about 3 weeks ago.  Has occasional constipation for which he takes as needed stool softener.  In the ED, initial EKG was sinus rhythm with nonspecific conduction delay.  Troponin was normal x3.  Magnesium was 1.9, potassium was 2.6.  Telemetry monitoring in the ED showed nonsustained ventricular tachycardia.  Patient was given IV amiodarone bolus, widening of QRS was noted.  Lidocaine drip started for arrhythmia control due to widening QRS while administering amiodarone.   Past Medical History:  Diagnosis Date  . Abnormality of gait   . Back pain   . Cancer ([place]) 04/2019   stage IV lung cancer being treated at [facility], left hip muscle, stomach, top of small intesting, liver and lymphnode near lungs  . Cervical spondylosis with myelopathy 01/06/2014  .  Depression   . Insomnia   . Parkinson disease ([place]) 04/2018   diagnosed by neurology at [facility]  . PTSD (post-traumatic stress disorder)   . PTSD (post-traumatic stress disorder)     Past Surgical History:  Procedure Laterality Date  . C-spine     C-3-7 removed  . SCALP LESION REMOVAL W/ FLAP AND SKIN GRAFT    . SKIN CANCER EXCISION Left    squamos cell cancinoma     Home Medications:  Prior to Admission medications   Medication Sig Start Date End Date Taking? Authorizing Provider  buPROPion HCl ER, XL, 450 MG TB24 Take 450 mg by mouth daily. Pt taking 300mg  daily 12/10/17   [provider]  carbidopa-levodopa (SINEMET IR) 25-100 MG tablet Take 3 tablets by mouth 3 (three) times daily.     [provider]  cyproheptadine (PERIACTIN) 4 MG tablet Take 4 mg by mouth daily.  04/30/19   [provider]  donepezil (ARICEPT) 10 MG tablet Take 10 mg by mouth at bedtime.    [provider]  escitalopram (LEXAPRO) 20 MG tablet Take 20 mg by mouth daily. 10/17/16   [provider]  fluticasone (FLONASE) 50 MCG/ACT nasal spray USE 2 SPRAYS IN EACH NOSTRIL EVERY DAY 11/12/19   Steele Sizer, MD  lamoTRIgine (LAMICTAL) 100 MG tablet 1 tablet daily. 02/14/18   [provider]  lidocaine (XYLOCAINE) 2 % solution Use as directed 15 mLs in the mouth or throat as needed for mouth pain. 06/15/19   Alfred Levins,  [place], MD  Multiple Vitamins-Minerals (COMPLETE MULTIVITAMIN/MINERAL PO) Take 1 tablet by mouth daily. 12/20/13   [provider]  Multiple Vitamins-Minerals (PRESERVISION AREDS 2 PO) Take by mouth.    [provider]  pembrolizumab (KEYTRUDA) 100 MG/4ML SOLN Inject 2 mg/kg into the vein. Immunotherapy treatments every 6 weeks at [facility], Historical, MD  polyethylene glycol (MIRALAX / GLYCOLAX) packet Take 17 g by mouth every other day.    [provider]  traZODone (DESYREL) 100 MG tablet Take 1 tablet by mouth at  bedtime. 02/03/19   [provider]    Inpatient Medications: Scheduled Meds: . acidophilus  2 capsule Oral TID  . buPROPion  300 mg Oral Daily  . carbidopa-levodopa  3 tablet Oral TID  . Chlorhexidine Gluconate Cloth  6 each Topical Q0600  . cyproheptadine  4 mg Oral Daily  . enoxaparin (LOVENOX) injection  40 mg Subcutaneous Q24H  . escitalopram  20 mg Oral Daily  . fluticasone  2 spray Each Nare Daily  . lamoTRIgine  100 mg Oral Daily  . lidocaine (cardiac) 100 mg/41mL      . polyethylene glycol  17 g Oral QODAY  . potassium chloride  60 mEq Oral BID  . sodium chloride flush  3 mL Intravenous Q12H   Continuous Infusions: . 0.9 % NaCl with KCl 40 mEq / L 100 mL/hr at 09/08/20 1128  . cefTRIAXone (ROCEPHIN)  IV Stopped (09/08/20 0413)  . lidocaine 3 mg/min (09/08/20 1212)  . potassium chloride 10 mEq (09/08/20 1130)   PRN Meds: acetaminophen **OR** acetaminophen, alum & mag hydroxide-simeth, bisacodyl, magnesium citrate, senna-docusate  Allergies:    Allergies  Allergen Reactions  . Augmentin [Amoxicillin-Pot Clavulanate] Diarrhea    Social History:   Social History   Socioeconomic History  . Marital status: Married    Spouse name: Darren Thomas  . Number of children: 2  . 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 Highest education level: 8th grade  Occupational History  . Occupation: retired  Tobacco Use  . Smoking status: Former Smoker    Packs/day: 1.00    Years: 20.00    Pack years: 20.00    Types: Cigarettes    Quit date: 1973    Years since quitting: 49.3  . Smokeless tobacco: Never Used  . Tobacco comment: smoking cessation materials not required  Vaping Use  . Vaping Use: Never used  Substance and Sexual Activity  . Alcohol use: No  . Drug use: No  . Sexual activity: Not Currently  Other Topics Concern  . Not on file  Social History Narrative  . Not on file   Social Determinants of Health   Financial Resource Strain: Not on file  Food  Insecurity: Not on file  Transportation Needs: Not on file  Physical Activity: Not on file  Stress: Not on file  Social Connections: Not on file  Intimate Partner Violence: Not on file    Family History:    Family History  Problem Relation Age of Onset  . Cervical cancer Mother   . Heart Problems Father   . Diabetes Father   . Asthma Brother   . Emphysema Brother        was a smoker  . Diabetes type I Son   . Colon cancer Son 8     ROS:  Please see the history of present illness.   All other ROS reviewed and negative.     Physical Exam/Data:  Vitals:   09/08/20 0830 09/08/20 1005 09/08/20 1100 09/08/20 1200  BP: (!) 128/52 133/63 134/64 133/67  Pulse: (!) 47  (!) 51 (!) 57  Resp: 15 16 15 18   Temp: 97.9 F (36.6 C) (!) 96.5 F (35.8 C)    TempSrc:  Oral    SpO2: 98% 100% 95% 100%  Weight:  71 kg    Height:  6\' 2"  (1.88 m)      Intake/Output Summary (Last 24 hours) at 09/08/2020 1308 Last data filed at 09/08/2020 0631 Gross per 24 hour  Intake 2600.87 ml  Output --  Net 2600.87 ml   Last 3 Weights 09/08/2020 09/07/2020 05/27/2019  Weight (lbs) 156 lb 8.4 oz 155 lb 10.3 oz 168 lb 12.8 oz  Weight (kg) 71 kg 70.6 kg 76.567 kg     Body mass index is 20.1 kg/m.  General: Appears weak, soft-spoken. HEENT: normal Lymph: no adenopathy Neck: no JVD Endocrine:  No thryomegaly Vascular: No carotid bruits; FA pulses 2+ bilaterally without bruits  Cardiac:  normal S1, S2; RRR; no murmur  Lungs: Poor inspiratory effort, clear anteriorly Abd: soft, nontender, no hepatomegaly  Ext: no edema Musculoskeletal:  No deformities, appears weak Skin: warm and dry  Neuro:  CNs 2-12 intact, no focal abnormalities noted Psych:  Normal affect   EKG:  The EKG was personally reviewed and demonstrates: Sinus rhythm Telemetry:  Telemetry was personally reviewed and demonstrates: Sinus rhythm, occasional nonsustained VT lasting up to 20 seconds  Relevant CV  Studies: Echocardiogram pending  Laboratory Data:  High Sensitivity Troponin:   Recent Labs  Lab 09/07/20 2255 09/08/20 0533 09/08/20 0800  TROPONINIHS 7 9 8      Chemistry Recent Labs  Lab 09/07/20 2255 09/08/20 0533 09/08/20 0800  NA 137 137 130*  K 2.6* 2.6* 2.5*  CL 98 99 96*  CO2 29 27 25   GLUCOSE 112* 83 315*  BUN 19 15 14   CREATININE 0.84 0.98  0.96 0.98  CALCIUM 8.4* 7.6* 7.2*  GFRNONAA >60 >60  >60 >60  ANIONGAP 10 11 9     Recent Labs  Lab 09/07/20 2255  PROT 6.0*  ALBUMIN 3.3*  AST 21  ALT 9  ALKPHOS 60  BILITOT 1.0   Hematology Recent Labs  Lab 09/07/20 2255 09/08/20 0533  WBC 9.4 8.7  RBC 4.59 3.98*  HGB 13.4 11.7*  HCT 37.7* 33.3*  MCV 82.1 83.7  MCH 29.2 29.4  MCHC 35.5 35.1  RDW 14.1 14.1  PLT 284 217   BNPNo results for input(s): BNP, PROBNP in the last 168 hours.  DDimer No results for input(s): DDIMER in the last 168 hours.   Radiology/Studies:  DG Chest 1 View  Result Date: 09/08/2020 CLINICAL DATA:  Initial evaluation for acute syncope. EXAM: CHEST  1 VIEW COMPARISON:  Prior radiograph from 06/15/2019. FINDINGS: Cardiac and mediastinal silhouettes are stable, and remain within normal limits. Aortic atherosclerosis. Lungs normally inflated. Scattered linear subsegmental atelectasis and/or scarring present at the bilateral lung bases. No focal infiltrates. No edema or effusion. No pneumothorax. Different related pads overlie the left lower chest. No acute osseous finding. Postsurgical changes partially visualize within the cervical spine. IMPRESSION: 1. Mild bibasilar linear subsegmental atelectasis and/or scarring. 2. No other active cardiopulmonary disease. 3.  Aortic Atherosclerosis (ICD10-I70.0). Electronically Signed   By: Jeannine Boga M.D.   On: 09/08/2020 03:13     Assessment and Plan:   1. Syncope, nonsustained ventricular tachycardia -Etiology multifactorial in light of severe hypokalemia likely from  diarrhea. -Arrhythmia worsened/QRS widening with amiodarone. -Aggressively replete potassium to keep K above 4. -Added p.o. KCl 60 mg twice daily.  Check BMP in 4 hours. -Increase lidocaine drip to 3 mg/min. -Hopefully tachycardia is controlled after repletion of potassium. -We will consider low-dose beta-blocker after electrolytes are repleted. -Get echocardiogram -Consider EP input Monday  2.  Diarrhea, COVID-19 -Management as per primary team  3.  Parkinson's disease -PTA medications    Risk Assessment/Risk Scores:     Signed, Kate Sable, MD  09/08/2020 1:08 PM

## 2020-09-08 NOTE — Consult Note (Signed)
NAME:  Darren Thomas., MRN:  [id number], DOB:  1945/09/08, LOS: 0 ADMISSION DATE:  09/07/2020, CONSULTATION DATE: 09/08/2020 REFERRING MD: Dr. Ara Kussmaul, CHIEF COMPLAINT: Syncope  History of Present Illness:  75 year old male presenting to [facility] ED from home via EMS due to syncopal episode while sitting in recliner at home.  Patient reports feeling " real hot & then just went out of it" similar to a flushing feeling that started in his belly and crept up towards his throat.  Patient had last dose of immunotherapy with Keytruda 2 to 3 weeks ago and since that time the patient has had diarrhea and has been nauseated with frequent vomiting episodes.  Because of this the wife reports he has lost a lot of weight and is not eating or drinking very much. Wife called EMS when she noted the patient lost consciousness in his recliner. ED course: Patient arrived to the ED alert, awake and talking.  Patient noted to have a run of approximately 20+ beats of ventricular tachycardia up to 235 bpm which was self sustained.  Patient was given an amiodarone bolus followed by amiodarone infusion.  He then had 2 additional runs of ventricular tachycardia at which point he reported being lightheaded but did not lose consciousness.  Per ED nursing patient has had multiple runs of self sustained ventricular tachycardia with some syncope. Initial vitals: Afebrile at 97.3, RR 11, HR 69, BP 118/71 (83) and SPO2 100% on room air. Significant labs: Hypokalemia at 2.6, mag 1.9, albumin 3.3, UA: + Ketones, + leukocytes, + nitrites w/ many bacteria and > 50 WBCs.  COVID PCR positive  PCCM consulted due to ongoing runs of ventricular tachycardia. Pertinent  Medical History  Stage IV lung cancer Parkinson's COVID-19 (2 to 3 weeks ago) Depression  Significant Hospital Events: Including procedures, antibiotic start and stop dates in addition to other pertinent events   . 09/08/2020-admitted due to self sustaining symptomatic  ventricular tachycardia with syncope on amiodarone drip.  Interim History / Subjective:  Patient lying on ED stretcher with wife bedside.  Patient denies being able to feel any palpitations, describes as above a flushing feeling that starts in his abdomen and reaches up towards his neck.  He denies any chest pain/shortness of breath associated with this feeling.  He also denies any blurred vision or other associated symptoms.  And states nothing like this is happened to him before.  Objective   Blood pressure 118/71, pulse 69, temperature (!) 97.3 F (36.3 C), temperature source Oral, resp. rate 11, height 6\' 2"  (1.88 m), weight 70.6 kg, SpO2 100 %.       No intake or output data in the 24 hours ending 09/08/20 0059 Filed Weights   09/07/20 2213  Weight: 70.6 kg    Examination: General: Adult male, critically/chronically ill, lying in bed, frail and cachectic appearing, NAD HEENT: MM pale/dry, anicteric, atraumatic, neck supple Neuro: A&O x 4, able to follow commands, PERRL +3, MAE, generalized weakness CV: s1s2 RRR, NSR on monitor, no r/m/g Pulm: Regular, non labored on RA, breath sounds diminished throughout GI: soft, rounded, non tender, bs x 4 Skin: Limited exam- no rashes/lesions noted Extremities: warm/dry, pulses + 2 R/P, +1 edema noted BLE  Labs/imaging that I have personally reviewed  (right click and "Reselect all SmartList Selections" daily)  EKG Interpretation Date:  09/07/2020 EKG Time:  22:54 Rate:  64 Rhythm: Sinus rhythm QRS Axis:  Normal Intervals: QTC prolongation at 589 ST/T Wave abnormalities: None Narrative Interpretation:  NSR with QTC prolongation Na+/ K+: 137/2.6-treatment ordered BUN/Cr.:  19/0.84 Serum CO2/ AG: 29/10  Hgb: 13.4 Troponin: 7 CK: 166  WBC/ TMAX: 9.4/afebrile CXR 09/08/2020: Mild bibasilar linear subsegmental atelectasis/scarring Resolved Hospital Problem list     Assessment & Plan:  Symptomatic self sustained ventricular  tachycardia in the setting of malnutrition and dehydration versus QTC prolongation Patient was given an amiodarone bolus followed by an amiodarone drip.  EKG recheck of QTC shows an increase to greater than 600 post amiodarone.  Patient also appearing to have R on T PVC's.  Discussed the case with Dr. Oletta Darter at [facility], who is in agreement of lidocaine bolus followed by lidocaine drip.  Electrolyte replacement already ordered and initiated as well as IV fluid resuscitation. -Daily BMP, replace electrolytes as needed -Continuous cardiac monitoring -Follow-up EKG at 6 AM -Lidocaine bolus followed by lidocaine drip -Amiodarone discontinued, home trazodone discontinued -Echocardiogram pending -Per [facility] cardiology consult for a.m., appreciate input   Best practice (right click and "Reselect all SmartList Selections" daily)  Diet:  NPO Pain/Anxiety/Delirium protocol (if indicated): No VAP protocol (if indicated): Not indicated DVT prophylaxis: LMWH GI prophylaxis: N/A*Per primary Glucose control:  SSI No Central venous access:  N/A Arterial line:  N/A Foley:  N/A Mobility:  bed rest  PT consulted: N/A Last date of multidisciplinary goals of care discussion 09/08/2020 Code Status:  DNR Disposition: Stepdown  Labs   CBC: Recent Labs  Lab 09/07/20 2255  WBC 9.4  HGB 13.4  HCT 37.7*  MCV 82.1  PLT 476    Basic Metabolic Panel: Recent Labs  Lab 09/07/20 2255  NA 137  K 2.6*  CL 98  CO2 29  GLUCOSE 112*  BUN 19  CREATININE 0.84  CALCIUM 8.4*  MG 1.9   GFR: Estimated Creatinine Clearance: 73.5 mL/min (by C-G formula based on SCr of 0.84 mg/dL). Recent Labs  Lab 09/07/20 2255  WBC 9.4    Liver Function Tests: Recent Labs  Lab 09/07/20 2255  AST 21  ALT 9  ALKPHOS 60  BILITOT 1.0  PROT 6.0*  ALBUMIN 3.3*   No results for input(s): LIPASE, AMYLASE in the last 168 hours. No results for input(s): AMMONIA in the last 168 hours.  ABG No results found for: PHART,  PCO2ART, PO2ART, HCO3, TCO2, ACIDBASEDEF, O2SAT   Coagulation Profile: No results for input(s): INR, PROTIME in the last 168 hours.  Cardiac Enzymes: Recent Labs  Lab 09/07/20 2255  CKTOTAL 166    HbA1C: Hgb A1c MFr Bld  Date/Time Value Ref Range Status  05/15/2017 12:19 PM 4.8 <5.7 % of total Hgb Final    Comment:    For the purpose of screening for the presence of diabetes: . <5.7%       Consistent with the absence of diabetes 5.7-6.4%    Consistent with increased risk for diabetes             (prediabetes) > or =6.5%  Consistent with diabetes . This assay result is consistent with a decreased risk of diabetes. . Currently, no consensus exists regarding use of hemoglobin A1c for diagnosis of diabetes in children. . According to American Diabetes Association (ADA) guidelines, hemoglobin A1c <7.0% represents optimal control in non-pregnant diabetic patients. Different metrics may apply to specific patient populations.  Standards of Medical Care in Diabetes(ADA). .     CBG: No results for input(s): GLUCAP in the last 168 hours.  Review of Systems: Positives in bold  Gen: Denies fever, chills, weight  change, fatigue, night sweats HEENT: Denies blurred vision, double vision, hearing loss, tinnitus, sinus congestion, rhinorrhea, sore throat, neck stiffness, dysphagia PULM: Denies shortness of breath, cough, sputum production, hemoptysis, wheezing CV: Denies chest pain, edema, orthopnea, paroxysmal nocturnal dyspnea, palpitations GI: Denies abdominal pain, nausea, vomiting, diarrhea, hematochezia, melena, constipation, change in bowel habits GU: Denies dysuria, hematuria, polyuria, oliguria, urethral discharge Endocrine: Denies hot or cold intolerance, polyuria, polyphagia or appetite change Derm: Denies rash, dry skin, scaling or peeling skin change Heme: Denies easy bruising, bleeding, bleeding gums Neuro: Denies headache, numbness, weakness, slurred speech, loss of  memory or consciousness  Past Medical History:  He,  has a past medical history of Abnormality of gait, Back pain, Cancer ([place]) (04/2019), Cervical spondylosis with myelopathy (01/06/2014), Depression, Insomnia, Parkinson disease ([place]) (04/2018), PTSD (post-traumatic stress disorder), and PTSD (post-traumatic stress disorder).   Surgical History:   Past Surgical History:  Procedure Laterality Date  . C-spine     C-3-7 removed  . SCALP LESION REMOVAL W/ FLAP AND SKIN GRAFT    . SKIN CANCER EXCISION Left    squamos cell cancinoma     Social History:   reports that he quit smoking about 49 years ago. His smoking use included cigarettes. He has a 20.00 pack-year smoking history. He has never used smokeless tobacco. He reports that he does not drink alcohol and does not use drugs.   Family History:  His family history includes Asthma in his brother; Cervical cancer in his mother; Colon cancer (age of onset: 54) in his son; Diabetes in his father; Diabetes type I in his son; Emphysema in his brother; Heart Problems in his father.   Allergies Allergies  Allergen Reactions  . Augmentin [Amoxicillin-Pot Clavulanate] Diarrhea     Home Medications  Prior to Admission medications   Medication Sig Start Date End Date Taking? Authorizing Provider  buPROPion HCl ER, XL, 450 MG TB24 Take 450 mg by mouth daily. Pt taking 300mg  daily 12/10/17   [provider]  carbidopa-levodopa (SINEMET IR) 25-100 MG tablet Take 3 tablets by mouth 3 (three) times daily.     [provider]  cyproheptadine (PERIACTIN) 4 MG tablet Take 4 mg by mouth daily.  04/30/19   [provider]  donepezil (ARICEPT) 10 MG tablet Take 10 mg by mouth at bedtime.    [provider]  escitalopram (LEXAPRO) 20 MG tablet Take 20 mg by mouth daily. 10/17/16   [provider]  fluticasone (FLONASE) 50 MCG/ACT nasal spray USE 2 SPRAYS IN EACH NOSTRIL EVERY DAY 11/12/19   Steele Sizer, MD   lamoTRIgine (LAMICTAL) 100 MG tablet 1 tablet daily. 02/14/18   [provider]  lidocaine (XYLOCAINE) 2 % solution Use as directed 15 mLs in the mouth or throat as needed for mouth pain. 06/15/19   Rudene Re, MD  Multiple Vitamins-Minerals (COMPLETE MULTIVITAMIN/MINERAL PO) Take 1 tablet by mouth daily. 12/20/13   [provider]  Multiple Vitamins-Minerals (PRESERVISION AREDS 2 PO) Take by mouth.    [provider]  pembrolizumab (KEYTRUDA) 100 MG/4ML SOLN Inject 2 mg/kg into the vein. Immunotherapy treatments every 6 weeks at [facility], Historical, MD  polyethylene glycol (MIRALAX / GLYCOLAX) packet Take 17 g by mouth every other day.    [provider]  traZODone (DESYREL) 100 MG tablet Take 1 tablet by mouth at bedtime. 02/03/19   [provider]     Critical care time: 40 minutes   Domingo Pulse  [place], AGACNP-BC Acute Care Nurse Practitioner [facility]   [phone] / [phone] Please see Amion for pager details.

## 2020-09-09 ENCOUNTER — Inpatient Hospital Stay (HOSPITAL_COMMUNITY)
Admit: 2020-09-09 | Discharge: 2020-09-09 | Disposition: A | Discharge status: Home / Self Care | Payer: No Typology Code available for payment source | Attending: Family Medicine | Admitting: Family Medicine

## 2020-09-09 ENCOUNTER — Encounter: Payer: Self-pay | Admitting: Family Medicine

## 2020-09-09 DIAGNOSIS — R55 Syncope and collapse: Secondary | ICD-10-CM

## 2020-09-09 DIAGNOSIS — I472 Ventricular tachycardia: Secondary | ICD-10-CM

## 2020-09-09 LAB — GASTROINTESTINAL PANEL BY PCR, STOOL (REPLACES STOOL CULTURE)

## 2020-09-09 LAB — GLUCOSE, CAPILLARY
Glucose-Capillary: 64 mg/dL — ABNORMAL LOW (ref 70–99)
Glucose-Capillary: 111 mg/dL — ABNORMAL HIGH (ref 70–99)
Glucose-Capillary: 88 mg/dL (ref 70–99)
Glucose-Capillary: 85 mg/dL (ref 70–99)
Glucose-Capillary: 93 mg/dL (ref 70–99)
Glucose-Capillary: 96 mg/dL (ref 70–99)
Glucose-Capillary: 95 mg/dL (ref 70–99)

## 2020-09-09 LAB — CBC
HCT: 33.1 % — ABNORMAL LOW (ref 39.0–52.0)
Hemoglobin: 11.5 g/dL — ABNORMAL LOW (ref 13.0–17.0)
MCH: 29.7 pg (ref 26.0–34.0)
MCHC: 34.7 g/dL (ref 30.0–36.0)
MCV: 85.5 fL (ref 80.0–100.0)
Platelets: 187 10*3/uL (ref 150–400)
RBC: 3.87 MIL/uL — ABNORMAL LOW (ref 4.22–5.81)
RDW: 14.6 % (ref 11.5–15.5)
WBC: 6.8 10*3/uL (ref 4.0–10.5)
nRBC: 0 % (ref 0.0–0.2)

## 2020-09-09 LAB — BASIC METABOLIC PANEL
Anion gap: 5 (ref 5–15)
Anion gap: 7 (ref 5–15)
Anion gap: 7 (ref 5–15)
BUN: 9 mg/dL (ref 8–23)
BUN: 7 mg/dL — ABNORMAL LOW (ref 8–23)
BUN: 8 mg/dL (ref 8–23)
CO2: 25 mmol/L (ref 22–32)
CO2: 24 mmol/L (ref 22–32)
CO2: 27 mmol/L (ref 22–32)
Calcium: 7.8 mg/dL — ABNORMAL LOW (ref 8.9–10.3)
Calcium: 7.8 mg/dL — ABNORMAL LOW (ref 8.9–10.3)
Calcium: 8 mg/dL — ABNORMAL LOW (ref 8.9–10.3)
Chloride: 108 mmol/L (ref 98–111)
Chloride: 105 mmol/L (ref 98–111)
Chloride: 102 mmol/L (ref 98–111)
Creatinine, Ser: 0.78 mg/dL (ref 0.61–1.24)
Creatinine, Ser: 0.63 mg/dL (ref 0.61–1.24)
Creatinine, Ser: 0.88 mg/dL (ref 0.61–1.24)
GFR, Estimated: >60 mL/min (ref >60)
GFR, Estimated: >60 mL/min (ref >60)
GFR, Estimated: >60 mL/min (ref >60)
Glucose, Bld: 79 mg/dL (ref 70–99)
Glucose, Bld: 104 mg/dL — ABNORMAL HIGH (ref 70–99)
Glucose, Bld: 98 mg/dL (ref 70–99)
Potassium: 4.1 mmol/L (ref 3.5–5.1)
Potassium: 3.9 mmol/L (ref 3.5–5.1)
Potassium: 4 mmol/L (ref 3.5–5.1)
Sodium: 138 mmol/L (ref 135–145)
Sodium: 136 mmol/L (ref 135–145)
Sodium: 136 mmol/L (ref 135–145)

## 2020-09-09 LAB — ECHOCARDIOGRAM COMPLETE
AR max vel: 2.33 cm2
AV Area VTI: 2.15 cm2
AV Area mean vel: 1.87 cm2
AV Mean grad: 6 mmHg
AV Peak grad: 10 mmHg
Ao pk vel: 1.58 m/s
Area-P 1/2: 2.53 cm2
Height: 74 in
S' Lateral: 3.64 cm
Weight: 2515.01 oz

## 2020-09-09 LAB — MAGNESIUM: Magnesium: 1.9 mg/dL (ref 1.7–2.4)

## 2020-09-09 LAB — PHOSPHORUS: Phosphorus: 2.1 mg/dL — ABNORMAL LOW (ref 2.5–4.6)

## 2020-09-09 MED ORDER — METOPROLOL SUCCINATE ER 25 MG PO TB24
12.5000 mg | ORAL_TABLET | Freq: Every day | ORAL | Status: DC
  Administered 2020-09-09 – 2020-09-10 (×2): 12.5 mg via ORAL
  Filled 2020-09-09 (×2): qty 0.5

## 2020-09-09 MED ORDER — POTASSIUM CHLORIDE 20 MEQ PO PACK
40.0000 meq | PACK | Freq: Two times a day (BID) | ORAL | Status: DC
  Administered 2020-09-09 (×2): 40 meq via ORAL
  Filled 2020-09-09 (×3): qty 2

## 2020-09-09 MED ORDER — MAGNESIUM SULFATE 2 GM/50ML IV SOLN
2.0000 g | Freq: Once | INTRAVENOUS | Status: AC
Start: 2020-09-09 — End: 2020-09-09
  Administered 2020-09-09: 2 g via INTRAVENOUS
  Filled 2020-09-09: qty 50

## 2020-09-09 MED ORDER — SODIUM PHOSPHATES 45 MMOLE/15ML IV SOLN
15.0000 mmol | Freq: Once | INTRAVENOUS | Status: AC
  Administered 2020-09-09: 15 mmol via INTRAVENOUS
  Filled 2020-09-09: qty 5

## 2020-09-09 MED ORDER — PERFLUTREN LIPID MICROSPHERE
1.0000 mL | INTRAVENOUS | Status: DC | PRN
  Filled 2020-09-09: qty 10

## 2020-09-09 MED ORDER — PERFLUTREN LIPID MICROSPHERE
3.0000 mL | INTRAVENOUS | Status: AC | PRN
  Administered 2020-09-09: 3 mL via INTRAVENOUS
  Filled 2020-09-09 (×2): qty 4

## 2020-09-09 MED ORDER — LOPERAMIDE HCL 2 MG PO CAPS
4.0000 mg | ORAL_CAPSULE | Freq: Every day | ORAL | Status: DC
  Administered 2020-09-09: 4 mg via ORAL
  Filled 2020-09-09: qty 2

## 2020-09-09 NOTE — Consult Note (Signed)
[place] for Electrolyte Monitoring and Replacement   Recent Labs: Potassium (mmol/L)  Date Value  09/09/2020 4.0   Magnesium (mg/dL)  Date Value  09/09/2020 1.9   Calcium (mg/dL)  Date Value  09/09/2020 8.0 (L)   Albumin (g/dL)  Date Value  09/07/2020 3.3 (L)   Phosphorus (mg/dL)  Date Value  09/09/2020 2.1 (L)   Sodium (mmol/L)  Date Value  09/09/2020 136  05/14/2016 141   Assessment: Patient is a 75 y/o M with medical history including Parkinson's disease, lung cancer, depression / PTSD, recent SARS-CoV-2 infection who presented to the ED for evaluation of syncope. Noted to have multiple episodes of NSVT in the ED. QTc prolonged. Worsened on amiodarone and was changed to lidocaine drip. Pharmacy consulted to assist with electrolyte monitoring and replacement as indicated.  Patient also with NVD. Started on loperamide.  Goal of Therapy:  K > 4 Mg > 2 All other electrolytes within normal limits  Plan:  -- K now 4.0, Mg not collected this afternoon -- follow up electrolytes with AM labs  Emmalou Hunger Lessie Dings 09/09/2020 6:42 PM

## 2020-09-09 NOTE — Evaluation (Signed)
Occupational Therapy Evaluation Patient Details Name: Darren Thomas. MRN: [id number] DOB: 24-Mar-1946 Today's Date: 09/09/2020    History of Present Illness Pt is a 75 y/o M admitted on 09/07/20 for evaluation of syncope. Pt had 3 episodes of nonsustained VT in the ED associated with dizziness & lightheadedness, but no additional syncope. PMH: PD, lung CA, depression, PTSD, COVID 3 weeks ago, cervical spondylosis with myelopathy, insomnia   Clinical Impression   Pt seen for OT evaluation/treatment on this date. Upon arrival to room, pt awake and sitting upright in bed with spouse present. Pt agreeable to OT eval. Prior to admission, pt was living in a one-level townhouse (with 5 steps to enter) with wife. Pt was typically mod-independent with ADLs and functional mobility (using a SPC), occasionally receiving assistance from wife for bed mobility and donning jackets. Pt currently presents with decreased balance and activity tolerance, and requires MOD A for seated LB dressing and MIN GUARD to stand pivot transfers to/from [facility]. HR 50s-60s and SpO2 >93% throughout session. Pt would benefit from additional skilled OT services to maximize return to PLOF and minimize risk of future falls, injury, caregiver burden, and readmission. Upon discharge, recommend HHOT and supervision/assistance from wife PRN.    Follow Up Recommendations  Home health OT;Supervision - Intermittent    Equipment Recommendations  3 in 1 bedside commode       Precautions / Restrictions Precautions Precautions: Fall Restrictions Weight Bearing Restrictions: No      Mobility Bed Mobility Overal bed mobility: Needs Assistance Bed Mobility: Supine to Sit;Sit to Supine     Supine to sit: Min guard;HOB elevated Sit to supine: Min guard;HOB elevated        Transfers Overall transfer level: Needs assistance Equipment used: None Transfers: Sit to/from Stand Sit to Stand: Min guard              Balance Overall  balance assessment: Needs assistance Sitting-balance support: No upper extremity supported;Feet supported Sitting balance-Leahy Scale: Good Sitting balance - Comments: Good sitting balance at EOB reaching within BOS   Standing balance support: During functional activity;No upper extremity supported Standing balance-Leahy Scale: Fair Standing balance comment: MIN GUARD for stand pivot transfers                           ADL either performed or assessed with clinical judgement   ADL Overall ADL's : Needs assistance/impaired                     Lower Body Dressing: Moderate assistance;Sitting/lateral leans Lower Body Dressing Details (indicate cue type and reason): Pt able to doff socks with MIN GUARD. Required max assist to don socks in setting of decreased sitting balance Toilet Transfer: Stand-pivot;BSC;Min guard                   Vision Baseline Vision/History: Wears glasses Wears Glasses: At all times              Pertinent Vitals/Pain Pain Assessment: No/denies pain        Extremity/Trunk Assessment Upper Extremity Assessment Upper Extremity Assessment: Overall WFL for tasks assessed   Lower Extremity Assessment Lower Extremity Assessment: Defer to PT evaluation   Cervical / Trunk Assessment Cervical / Trunk Assessment: Kyphotic   Communication Communication Communication: No difficulties   Cognition Arousal/Alertness: Awake/alert Behavior During Therapy: WFL for tasks assessed/performed Overall Cognitive Status: Within Functional Limits for tasks assessed  General Comments: Pleasant and agreeable throughout   General Comments  HR 50s-60s and SpO2 >93% throughout session    Exercises Other Exercises Other Exercises: Educated on role of OT, POC, and d/c recomendations with wife and pt verbalizing understanding        Home Living Family/patient expects to be discharged to:: Private  residence Living Arrangements: Spouse/significant other Available Help at Discharge: Family;Available 24 hours/day Type of Home: Other(Comment) (townhome) Home Access: Stairs to enter [employer] of Steps: 5 Entrance Stairs-Rails: Right;Left;Can reach both Home Layout: One level     Bathroom Shower/Tub: Walk-in shower         Home Equipment: Kasandra Knudsen - single point;Walker - 4 wheels;Shower seat;Grab bars - tub/shower;Grab bars - toilet          Prior Functioning/Environment Level of Independence: Needs assistance  Gait / Transfers Assistance Needed: ambulatory with SPC, endorses 2 falls in the past 6 months stating "I probably tripped over my own feet", occasional assistance for bed mobility, wife has been providing physical assistance since pt experienced physical decline leading up to admission ADL's / Homemaking Assistance Needed: Independent with ADLs, occasional assist from wife to don jackets            OT Problem List: Decreased strength;Decreased activity tolerance;Impaired balance (sitting and/or standing)      OT Treatment/Interventions: Self-care/ADL training;Therapeutic exercise;Energy conservation;DME and/or AE instruction;Therapeutic activities;Visual/perceptual remediation/compensation;Patient/family education;Balance training    OT Goals(Current goals can be found in the care plan section) Acute Rehab OT Goals Patient Stated Goal: get better, go home OT Goal Formulation: With patient Time For Goal Achievement: 09/23/20 Potential to Achieve Goals: Good ADL Goals Pt Will Perform Grooming: with supervision;standing Pt Will Transfer to Toilet: with supervision;ambulating;regular height toilet Pt Will Perform Toileting - Clothing Manipulation and hygiene: with min guard assist;sit to/from stand  OT Frequency: Min 1X/week    AM-PAC OT "6 Clicks" Daily Activity     Outcome Measure Help from another person eating meals?: None Help from another person  taking care of personal grooming?: A Little Help from another person toileting, which includes using toliet, bedpan, or urinal?: A Little Help from another person bathing (including washing, rinsing, drying)?: A Lot Help from another person to put on and taking off regular upper body clothing?: A Little Help from another person to put on and taking off regular lower body clothing?: A Lot 6 Click Score: 17   End of Session Nurse Communication: Mobility status  Activity Tolerance: Patient tolerated treatment well Patient left: in bed;with call bell/phone within reach;with family/visitor present  OT Visit Diagnosis: Unsteadiness on feet (R26.81);Muscle weakness (generalized) (M62.81);Repeated falls (R29.6)                Time: 4944-9675 OT Time Calculation (min): 30 min Charges:  OT General Charges $OT Visit: 1 Visit OT Evaluation $OT Eval Moderate Complexity: 1 Mod OT Treatments $Self Care/Home Management : 8-22 mins  Fredirick Maudlin, OTR/L [place]

## 2020-09-09 NOTE — Progress Notes (Addendum)
PROGRESS NOTE    Darren Thomas.  XHB:[id number] DOB: February 26, 1946 DOA: 09/07/2020 PCP: Steele Sizer, MD  [facility]    Brief Narrative:   Darren Thomas is a 75 y.o. male with a known history of Parkinson's, lung C, depression/PTSD, COVID 3 weeks ago presents to the emergency department for evaluation of syncope.  Patient was in a usual state of health until recent COVID infection, he has not fully recovered his appetite, and has been having N, V/D  Patient sustained a syncope epiosde at home today while sitting in a chair.  Wife witnessed and denies seizure like activity.   Of note, he has had three episodes on nonsustained VT in the ER associated ith dizziness and lightheadedness, but no additional syncope.  All of his prior care has been at the [place].   Patient denies fevers/chills, weakness, dizziness, chest pain, shortness of breath abdominal pain, dysuria/frequency, changes in mental status.    Otherwise there has been no change in status. Patient has been taking medication as prescribed and there has been no recent change in medication or diet.  No recent antibiotics.  There has been no recent illness, hospitalizations, travel or sick contacts.      Assessment & Plan:   Active Problems:   Ventricular tachycardia (HCC)   Hypokalemia   Syncope   #. Syncope 2/2 VT Severe hypokalemia likely contributory, immunotherapy may also contribute. Worsened w/ amio. - cardiology and critical care consulted, admitted to step-down - lidocaine gtt running - treating hypokalemia as below - TTE ordered, read pending - DNR - possible EP consult Monday  # Hypokalemia # Hypophosphatemia 2/2 recent weeks of decreased PO, nausea, diarrhea. K 2.6 on presentation. Now resolved w/ aggressive supplementation, 3.9 today. Tolerating PO. . Mg wnl.  - bmp q12 - d/c fluids - iv phos replacement ordered - oral kcl 40 bid  # Nausea, vomiting, diarrhea Likely 2/2 lung cancer and  its treatment. Gi pathogen panel neg. . No sig abd pain/tenderness so holding on scan for now. Pt says stool more formed yesterday and today - monitor - will start loperamide - holding off on c diff as stool more formed today  #. UTI, symptomatic - Follow up urine culture, growing GNRs - cong IV rocephin for now  # Covid positive Appears asymptomatic, no o2 requirement. Symptoms 4/15, diagnosed 4/17. - monitor - airborne and contact precautions  # prolonged qtc In the 600s - avoid qtc prolongers. Home donepezil and trazodone held. Will also hold home escitalopram  #. History of Parkinsons - Continue Sinemet   #. History of depression/PTSD - Continue Bupropion, Lamictal. Hold escitalopram as above  # Lung cancer Followed at [place], on [place] every 6 wks, last dose early April  # Goals of care - consider involving palliative at some point. Pt was on hospice last year, taken off, but this appears to represent a significant decline   DVT prophylaxis: lovenox Code Status: dnr Family Communication: wife updated @ bedside 4/30  Level of care: Stepdown Status is: Inpatient  Remains inpatient appropriate because:Inpatient level of care appropriate due to severity of illness   Dispo: The patient is from: Home              Anticipated d/c is to: tbd              Patient currently is not medically stable to d/c.   Difficult to place patient No        Consultants:  Critical  care, cardiology  Procedures: none  Antimicrobials:  Ceftriaxone 4/29>    Subjective: This morning feels a bit better. Tolerated breakfast no voming. Had loose stool this am. No chest pain or palpitations or dyspena.   Objective: Vitals:   09/09/20 0500 09/09/20 0700 09/09/20 0800 09/09/20 0900  BP: (!) 141/75 128/63 (!) 141/99 124/68  Pulse: (!) 49 (!) 52 (!) 54 (!) 53  Resp: 15 17 19 18   Temp:   (!) 96 F (35.6 C)   TempSrc:   Oral   SpO2: 99% 100% 99% 100%  Weight: 71.3 kg      Height:        Intake/Output Summary (Last 24 hours) at 09/09/2020 0923 Last data filed at 09/09/2020 0540 Gross per 24 hour  Intake 2385.64 ml  Output 2500 ml  Net -114.36 ml   Filed Weights   09/07/20 2213 09/08/20 1005 09/09/20 0500  Weight: 70.6 kg 71 kg 71.3 kg    Examination:  General exam: Appears calm, frail Respiratory system: Clear to auscultation save for faint rales at bases Cardiovascular system: S1 & S2 heard, bradycardic, soft systolic murmur Gastrointestinal system: Abdomen is nondistended, soft and nontender. No organomegaly or masses felt. Normal bowel sounds heard. Central nervous system: Alert and oriented. No focal neurological deficits. Extremities: trace LE edema Skin: No rashes, lesions or ulcers Psychiatry: Judgement and insight appear normal. Appears depressed     Data Reviewed: I have personally reviewed following labs and imaging studies  CBC: Recent Labs  Lab 09/07/20 2255 09/08/20 0533 09/09/20 0412  WBC 9.4 8.7 6.8  HGB 13.4 11.7* 11.5*  HCT 37.7* 33.3* 33.1*  MCV 82.1 83.7 85.5  PLT 284 217 299   Basic Metabolic Panel: Recent Labs  Lab 09/07/20 2255 09/08/20 0533 09/08/20 0800 09/08/20 1418 09/08/20 2004 09/09/20 0134 09/09/20 0412 09/09/20 0747  NA 137 137 130* 136 136 138  --  136  K 2.6* 2.6* 2.5* 3.4* 3.4* 4.1  --  3.9  CL 98 99 96* 103 104 108  --  105  CO2 29 27 25 25 24 25   --  24  GLUCOSE 112* 83 315* 89 86 79  --  104*  BUN 19 15 14 14 11 9   --  7*  CREATININE 0.84 0.98  0.96 0.98 0.88 0.74 0.78  --  0.63  CALCIUM 8.4* 7.6* 7.2* 7.8* 7.8* 7.8*  --  7.8*  MG 1.9 2.3  --   --   --   --  1.9  --   PHOS  --  3.2  --   --   --   --  2.1*  --    GFR: Estimated Creatinine Clearance: 78 mL/min (by C-G formula based on SCr of 0.63 mg/dL). Liver Function Tests: Recent Labs  Lab 09/07/20 2255  AST 21  ALT 9  ALKPHOS 60  BILITOT 1.0  PROT 6.0*  ALBUMIN 3.3*   No results for input(s): LIPASE, AMYLASE in the last  168 hours. No results for input(s): AMMONIA in the last 168 hours. Coagulation Profile: No results for input(s): INR, PROTIME in the last 168 hours. Cardiac Enzymes: Recent Labs  Lab 09/07/20 2255  CKTOTAL 166   BNP (last 3 results) No results for input(s): PROBNP in the last 8760 hours. HbA1C: No results for input(s): HGBA1C in the last 72 hours. CBG: Recent Labs  Lab 09/08/20 1011 09/09/20 0450 09/09/20 0629 09/09/20 0757  GLUCAP 81 64* 111* 88   Lipid Profile: Recent Labs  09/08/20 0533  CHOL 109  HDL 29*  LDLCALC 69  TRIG 53  CHOLHDL 3.8   Thyroid Function Tests: Recent Labs    09/08/20 0533  TSH 3.265   Anemia Panel: No results for input(s): VITAMINB12, FOLATE, FERRITIN, TIBC, IRON, RETICCTPCT in the last 72 hours. Urine analysis:    Component Value Date/Time   COLORURINE AMBER (A) 09/07/2020 2255   APPEARANCEUR CLOUDY (A) 09/07/2020 2255   LABSPEC 1.024 09/07/2020 2255   PHURINE 6.0 09/07/2020 2255   GLUCOSEU NEGATIVE 09/07/2020 2255   HGBUR NEGATIVE 09/07/2020 2255   BILIRUBINUR NEGATIVE 09/07/2020 2255   KETONESUR 80 (A) 09/07/2020 2255   PROTEINUR 30 (A) 09/07/2020 2255   NITRITE POSITIVE (A) 09/07/2020 2255   LEUKOCYTESUR SMALL (A) 09/07/2020 2255   Sepsis Labs: @LABRCNTIP (procalcitonin:4,lacticidven:4)  ) Recent Results (from the past 240 hour(s))  Urine Culture     Status: Abnormal (Preliminary result)   Collection Time: 09/07/20 10:55 PM   Specimen: Urine, Random  Result Value Ref Range Status   Specimen Description   Final    URINE, RANDOM Performed at [facility], [address], [place], [place] [address]    Special Requests   Final    Normal Performed at [facility], [address], [place], [place] [address]    Culture >=100,000 COLONIES/mL GRAM NEGATIVE RODS (A)  Final   Report Status PENDING  Incomplete  Resp Panel by RT-PCR (Flu A&B, Covid) Nasopharyngeal Swab     Status: Abnormal   Collection  Time: 09/08/20 12:58 AM   Specimen: Nasopharyngeal Swab; Nasopharyngeal(NP) swabs in vial transport medium  Result Value Ref Range Status   SARS Coronavirus 2 by RT PCR POSITIVE (A) NEGATIVE Final    Comment: CRITICAL RESULT CALLED TO, READ BACK BY AND VERIFIED WITH: JUAN HENDERSON AT 0300 09/08/20 MF (NOTE) SARS-CoV-2 target nucleic acids are DETECTED.  The SARS-CoV-2 RNA is generally detectable in upper respiratory specimens during the acute phase of infection. Positive results are indicative of the presence of the identified virus, but do not rule out bacterial infection or co-infection with other pathogens not detected by the test. Clinical correlation with patient history and other diagnostic information is necessary to determine patient infection status. The expected result is Negative.  Fact Sheet for Patients: EntrepreneurPulse.com.au  Fact Sheet for Healthcare Providers: IncredibleEmployment.be  This test is not yet approved or cleared by the [place] FDA and  has been authorized for detection and/or diagnosis of SARS-CoV-2 by FDA under an Emergency Use Authorization (EUA).  This EUA will remain in effect (meaning this te st can be used) for the duration of  the COVID-19 declaration under Section 564(b)(1) of the Act, 21 U.S.C. section 360bbb-3(b)(1), unless the authorization is terminated or revoked sooner.     Influenza A by PCR NEGATIVE NEGATIVE Final   Influenza B by PCR NEGATIVE NEGATIVE Final    Comment: (NOTE) The Xpert Xpress SARS-CoV-2/FLU/RSV plus assay is intended as an aid in the diagnosis of influenza from Nasopharyngeal swab specimens and should not be used as a sole basis for treatment. Nasal washings and aspirates are unacceptable for Xpert Xpress SARS-CoV-2/FLU/RSV testing.  Fact Sheet for Patients: EntrepreneurPulse.com.au  Fact Sheet for Healthcare  Providers: IncredibleEmployment.be  This test is not yet approved or cleared by the [place] FDA and has been authorized for detection and/or diagnosis of SARS-CoV-2 by FDA under an Emergency Use Authorization (EUA). This EUA will remain in effect (meaning this test can be used) for the  duration of the COVID-19 declaration under Section 564(b)(1) of the Act, 21 U.S.C. section 360bbb-3(b)(1), unless the authorization is terminated or revoked.  Performed at [facility], [address] [place], [place] [address]   MRSA PCR Screening     Status: None   Collection Time: 09/08/20 10:14 AM   Specimen: Nasal Mucosa; Nasopharyngeal  Result Value Ref Range Status   MRSA by PCR NEGATIVE NEGATIVE Final    Comment:        The GeneXpert MRSA Assay (FDA approved for NASAL specimens only), is one component of a comprehensive MRSA colonization surveillance program. It is not intended to diagnose MRSA infection nor to guide or monitor treatment for MRSA infections. Performed at [facility], [address] [place], [place] [address]   Gastrointestinal Panel by PCR , Stool     Status: None   Collection Time: 09/08/20 11:00 PM   Specimen: Stool  Result Value Ref Range Status   Campylobacter species NOT DETECTED NOT DETECTED Final   Plesimonas shigelloides NOT DETECTED NOT DETECTED Final   Salmonella species NOT DETECTED NOT DETECTED Final   Yersinia enterocolitica NOT DETECTED NOT DETECTED Final   Vibrio species NOT DETECTED NOT DETECTED Final   Vibrio cholerae NOT DETECTED NOT DETECTED Final   Enteroaggregative E coli (EAEC) NOT DETECTED NOT DETECTED Final   Enteropathogenic E coli (EPEC) NOT DETECTED NOT DETECTED Final   Enterotoxigenic E coli (ETEC) NOT DETECTED NOT DETECTED Final   Shiga like toxin producing E coli (STEC) NOT DETECTED NOT DETECTED Final   Shigella/Enteroinvasive E coli (EIEC) NOT DETECTED NOT DETECTED Final   Cryptosporidium  NOT DETECTED NOT DETECTED Final   Cyclospora cayetanensis NOT DETECTED NOT DETECTED Final   Entamoeba histolytica NOT DETECTED NOT DETECTED Final   Giardia lamblia NOT DETECTED NOT DETECTED Final   Adenovirus F40/41 NOT DETECTED NOT DETECTED Final   Astrovirus NOT DETECTED NOT DETECTED Final   Norovirus GI/GII NOT DETECTED NOT DETECTED Final   Rotavirus A NOT DETECTED NOT DETECTED Final   Sapovirus (I, II, IV, and V) NOT DETECTED NOT DETECTED Final    Comment: Performed at [facility], [address] [place], [place] [address]         Radiology Studies: DG Chest 1 View  Result Date: 09/08/2020 CLINICAL DATA:  Initial evaluation for acute syncope. EXAM: CHEST  1 VIEW COMPARISON:  Prior radiograph from 06/15/2019. FINDINGS: Cardiac and mediastinal silhouettes are stable, and remain within normal limits. Aortic atherosclerosis. Lungs normally inflated. Scattered linear subsegmental atelectasis and/or scarring present at the bilateral lung bases. No focal infiltrates. No edema or effusion. No pneumothorax. Different related pads overlie the left lower chest. No acute osseous finding. Postsurgical changes partially visualize within the cervical spine. IMPRESSION: 1. Mild bibasilar linear subsegmental atelectasis and/or scarring. 2. No other active cardiopulmonary disease. 3.  Aortic Atherosclerosis (ICD10-I70.0). Electronically Signed   By: Jeannine Boga M.D.   On: 09/08/2020 03:13        Scheduled Meds: . acidophilus  2 capsule Oral TID  . buPROPion  300 mg Oral Daily  . carbidopa-levodopa  3 tablet Oral TID  . Chlorhexidine Gluconate Cloth  6 each Topical Q0600  . cyproheptadine  4 mg Oral Daily  . enoxaparin (LOVENOX) injection  40 mg Subcutaneous Q24H  . escitalopram  20 mg Oral Daily  . fluticasone  2 spray Each Nare Daily  . lamoTRIgine  100 mg Oral Daily  . melatonin  5 mg Oral QHS  .  polyethylene glycol  17 g Oral QODAY  . potassium chloride  60 mEq Oral  BID  . sodium chloride flush  3 mL Intravenous Q12H   Continuous Infusions: . 0.9 % NaCl with KCl 40 mEq / L 100 mL/hr at 09/09/20 0400  . cefTRIAXone (ROCEPHIN)  IV Stopped (09/09/20 0251)  . lidocaine 3 mg/min (09/09/20 0452)  . sodium phosphate  Dextrose 5% IVPB 15 mmol (09/09/20 0649)     LOS: 1 day    Time spent: 30 min    Desma Maxim, MD [facility]   If 7PM-7AM, please contact night-coverage [email or web address] Password [facility] 09/09/2020, 9:23 AM

## 2020-09-09 NOTE — Progress Notes (Signed)
NAME:  Darren Thomas., MRN:  [id number], DOB:  12/22/1945, LOS: 1 ADMISSION DATE:  09/07/2020, CONSULTATION DATE: 09/08/2020 REFERRING MD: Dr. Ara Kussmaul, CHIEF COMPLAINT: Syncope  History of Present Illness:  75 year old male presenting to [facility] ED from home via EMS due to syncopal episode while sitting in recliner at home.  Patient reports feeling " real hot & then just went out of it" similar to a flushing feeling that started in his belly and crept up towards his throat.  Patient had last dose of immunotherapy with Keytruda 2 to 3 weeks ago and since that time the patient has had diarrhea and has been nauseated with frequent vomiting episodes.  Because of this the wife reports he has lost a lot of weight and is not eating or drinking very much. Wife called EMS when she noted the patient lost consciousness in his recliner.  ED course: Patient arrived to the ED alert, awake and talking.  Patient noted to have a run of approximately 20+ beats of ventricular tachycardia up to 235 bpm which was self sustained.  Patient was given an amiodarone bolus followed by amiodarone infusion.  He then had 2 additional runs of ventricular tachycardia at which point he reported being lightheaded but did not lose consciousness.  Per ED nursing patient has had multiple runs of self sustained ventricular tachycardia with some syncope. Initial vitals: Afebrile at 97.3, RR 11, HR 69, BP 118/71 (83) and SPO2 100% on room air. Significant Initial labs: Hypokalemia at 2.6, mag 1.9, albumin 3.3, UA: + Ketones, + leukocytes, + nitrites w/ many bacteria and > 50 WBCs.  COVID PCR positive  PCCM consulted due to ongoing runs of ventricular tachycardia. Pertinent  Medical History  Stage IV lung cancer Parkinson's COVID-19 (2 to 3 weeks ago) Depression  Significant Hospital Events: Including procedures, antibiotic start and stop dates in addition to other pertinent events   . 09/08/2020-admitted due to self sustaining  symptomatic ventricular tachycardia with syncope on amiodarone drip.  Interim History / Subjective:  Patient lying on ED stretcher with wife bedside.  Patient denies being able to feel any palpitations, describes as above a flushing feeling that starts in his abdomen and reaches up towards his neck.  He denies any chest pain/shortness of breath associated with this feeling.  He also denies any blurred vision or other associated symptoms.  And states nothing like this is happened to him before.  Objective   Blood pressure 124/68, pulse (!) 53, temperature (!) 96 F (35.6 C), temperature source Oral, resp. rate 18, height 6\' 2"  (1.88 m), weight 71.3 kg, SpO2 100 %.        Intake/Output Summary (Last 24 hours) at 09/09/2020 0915 Last data filed at 09/09/2020 0540 Gross per 24 hour  Intake 2385.64 ml  Output 2500 ml  Net -114.36 ml   Filed Weights   09/07/20 2213 09/08/20 1005 09/09/20 0500  Weight: 70.6 kg 71 kg 71.3 kg    Examination: General: Adult male, critically/chronically ill, lying in bed, frail and cachectic appearing, NAD HEENT: MM pale/dry, anicteric, atraumatic, neck supple Neuro: A&O x 4, able to follow commands, PERRL +3, MAE, generalized weakness CV: s1s2 RRR, NSR on monitor, no r/m/g Pulm: Regular, non labored on RA, breath sounds diminished throughout GI: soft, rounded, non tender, bs x 4 Skin: Limited exam- no rashes/lesions noted Extremities: warm/dry, pulses + 2 R/P, +1 edema noted BLE  Labs/imaging that I have personally reviewed  (right click and "Reselect all SmartList Selections"  daily)  EKG Interpretation Date:  09/07/2020 EKG Time:  22:54 Rate:  64 Rhythm: Sinus rhythm QRS Axis:  Normal Intervals: QTC prolongation at 589 ST/T Wave abnormalities: None Narrative Interpretation: NSR with QTC  Resolved Hospital Problem list     Assessment & Plan:    Syncope with non-sustained [facility]   Cardiology on case - appreciate input           lidocaine drip to 3  mg/min    Lung cancer -stage 4  - continue supportive care    - previously on [place]  - pharmacy consultation with repletion management  COVID19 infection -Remdesevir antiviral - pharmacy protocol 5 d -vitamin C -zinc  - utilize external urinary catheter if possible -Self prone if patient can tolerate  -d/c hepatotoxic medications while on remdesevir -supportive care with ICU telemetry monitoring -PT/OT when possible -procalcitonin, CRP and ferritin trending -no signs of bacterial infection - will dc rocephin to minimize cdiff risk  Diarreah   - presumably due to COVID  Parkinson's  Disease  - outpatient medications continued   Best practice (right click and "Reselect all SmartList Selections" daily)  Diet:  NPO Pain/Anxiety/Delirium protocol (if indicated): No VAP protocol (if indicated): Not indicated DVT prophylaxis: LMWH GI prophylaxis: N/A*Per primary Glucose control:  SSI No Central venous access:  N/A Arterial line:  N/A Foley:  N/A Mobility:  bed rest  PT consulted: N/A Last date of multidisciplinary goals of care discussion 09/08/2020 Code Status:  DNR Disposition: Stepdown  Labs   CBC: Recent Labs  Lab 09/07/20 2255 09/08/20 0533 09/09/20 0412  WBC 9.4 8.7 6.8  HGB 13.4 11.7* 11.5*  HCT 37.7* 33.3* 33.1*  MCV 82.1 83.7 85.5  PLT 284 217 578    Basic Metabolic Panel: Recent Labs  Lab 09/07/20 2255 09/08/20 0533 09/08/20 0800 09/08/20 1418 09/08/20 2004 09/09/20 0134 09/09/20 0412 09/09/20 0747  NA 137 137 130* 136 136 138  --  136  K 2.6* 2.6* 2.5* 3.4* 3.4* 4.1  --  3.9  CL 98 99 96* 103 104 108  --  105  CO2 29 27 25 25 24 25   --  24  GLUCOSE 112* 83 315* 89 86 79  --  104*  BUN 19 15 14 14 11 9   --  7*  CREATININE 0.84 0.98  0.96 0.98 0.88 0.74 0.78  --  0.63  CALCIUM 8.4* 7.6* 7.2* 7.8* 7.8* 7.8*  --  7.8*  MG 1.9 2.3  --   --   --   --  1.9  --   PHOS  --  3.2  --   --   --   --  2.1*  --     GFR: Estimated Creatinine Clearance: 78 mL/min (by C-G formula based on SCr of 0.63 mg/dL). Recent Labs  Lab 09/07/20 2255 09/08/20 0533 09/09/20 0412  WBC 9.4 8.7 6.8    Liver Function Tests: Recent Labs  Lab 09/07/20 2255  AST 21  ALT 9  ALKPHOS 60  BILITOT 1.0  PROT 6.0*  ALBUMIN 3.3*   No results for input(s): LIPASE, AMYLASE in the last 168 hours. No results for input(s): AMMONIA in the last 168 hours.  ABG No results found for: PHART, PCO2ART, PO2ART, HCO3, TCO2, ACIDBASEDEF, O2SAT   Coagulation Profile: No results for input(s): INR, PROTIME in the last 168 hours.  Cardiac Enzymes: Recent Labs  Lab 09/07/20 2255  CKTOTAL 166    HbA1C: Hgb A1c MFr  Bld  Date/Time Value Ref Range Status  05/15/2017 12:19 PM 4.8 <5.7 % of total Hgb Final    Comment:    For the purpose of screening for the presence of diabetes: . <5.7%       Consistent with the absence of diabetes 5.7-6.4%    Consistent with increased risk for diabetes             (prediabetes) > or =6.5%  Consistent with diabetes . This assay result is consistent with a decreased risk of diabetes. . Currently, no consensus exists regarding use of hemoglobin A1c for diagnosis of diabetes in children. . According to American Diabetes Association (ADA) guidelines, hemoglobin A1c <7.0% represents optimal control in non-pregnant diabetic patients. Different metrics may apply to specific patient populations.  Standards of Medical Care in Diabetes(ADA). .     CBG: Recent Labs  Lab 09/08/20 1011 09/09/20 0450 09/09/20 0629 09/09/20 0757  GLUCAP 81 64* 111* 88    Review of Systems: Positives in bold  Gen: Denies fever, chills, weight change, fatigue, night sweats HEENT: Denies blurred vision, double vision, hearing loss, tinnitus, sinus congestion, rhinorrhea, sore throat, neck stiffness, dysphagia PULM: Denies shortness of breath, cough, sputum production, hemoptysis, wheezing CV: Denies  chest pain, edema, orthopnea, paroxysmal nocturnal dyspnea, palpitations GI: Denies abdominal pain, nausea, vomiting, diarrhea, hematochezia, melena, constipation, change in bowel habits GU: Denies dysuria, hematuria, polyuria, oliguria, urethral discharge Endocrine: Denies hot or cold intolerance, polyuria, polyphagia or appetite change Derm: Denies rash, dry skin, scaling or peeling skin change Heme: Denies easy bruising, bleeding, bleeding gums Neuro: Denies headache, numbness, weakness, slurred speech, loss of memory or consciousness  Past Medical History:  He,  has a past medical history of Abnormality of gait, Back pain, Cancer ([place]) (04/2019), Cervical spondylosis with myelopathy (01/06/2014), Depression, Insomnia, Parkinson disease ([facility]) (04/2018), PTSD (post-traumatic stress disorder), and PTSD (post-traumatic stress disorder).   Surgical History:   Past Surgical History:  Procedure Laterality Date  . C-spine     C-3-7 removed  . SCALP LESION REMOVAL W/ FLAP AND SKIN GRAFT    . SKIN CANCER EXCISION Left    squamos cell cancinoma     Social History:   reports that he quit smoking about 49 years ago. His smoking use included cigarettes. He has a 20.00 pack-year smoking history. He has never used smokeless tobacco. He reports that he does not drink alcohol and does not use drugs.   Family History:  His family history includes Asthma in his brother; Cervical cancer in his mother; Colon cancer (age of onset: 76) in his son; Diabetes in his father; Diabetes type I in his son; Emphysema in his brother; Heart Problems in his father.   Allergies Allergies  Allergen Reactions  . Augmentin [Amoxicillin-Pot Clavulanate] Diarrhea  . Duloxetine     Other reaction(s): Chest pain     Home Medications  Prior to Admission medications   Medication Sig Start Date End Date Taking? Authorizing Provider  buPROPion HCl ER, XL, 450 MG TB24 Take 450 mg by mouth daily. Pt taking 300mg  daily  12/10/17   [provider]  carbidopa-levodopa (SINEMET IR) 25-100 MG tablet Take 3 tablets by mouth 3 (three) times daily.     [provider]  cyproheptadine (PERIACTIN) 4 MG tablet Take 4 mg by mouth daily.  04/30/19   [provider]  donepezil (ARICEPT) 10 MG tablet Take 10 mg by mouth at bedtime.    [provider]  escitalopram (LEXAPRO) 20  MG tablet Take 20 mg by mouth daily. 10/17/16   [provider]  fluticasone (FLONASE) 50 MCG/ACT nasal spray USE 2 SPRAYS IN EACH NOSTRIL EVERY DAY 11/12/19   Steele Sizer, MD  lamoTRIgine (LAMICTAL) 100 MG tablet 1 tablet daily. 02/14/18   [provider]  lidocaine (XYLOCAINE) 2 % solution Use as directed 15 mLs in the mouth or throat as needed for mouth pain. 06/15/19   Rudene Re, MD  Multiple Vitamins-Minerals (COMPLETE MULTIVITAMIN/MINERAL PO) Take 1 tablet by mouth daily. 12/20/13   [provider]  Multiple Vitamins-Minerals (PRESERVISION AREDS 2 PO) Take by mouth.    [provider]  pembrolizumab (KEYTRUDA) 100 MG/4ML SOLN Inject 2 mg/kg into the vein. Immunotherapy treatments every 6 weeks at [facility], Historical, MD  polyethylene glycol (MIRALAX / GLYCOLAX) packet Take 17 g by mouth every other day.    [provider]  traZODone (DESYREL) 100 MG tablet Take 1 tablet by mouth at bedtime. 02/03/19   [provider]     Critical care provider statement:    Critical care time (minutes):  33   Critical care time was exclusive of:  Separately billable procedures and  treating other patients   Critical care was necessary to treat or prevent imminent or  life-threatening deterioration of the following conditions:  COVID19, diarreah, VT, parkinsons, electrolyte derrangements   Critical care was time spent personally by me on the following  activities:  Development of treatment plan with patient or surrogate,  discussions with consultants,  evaluation of patient's response to  treatment, examination of patient, obtaining history from patient or  surrogate, ordering and performing treatments and interventions, ordering  and review of laboratory studies and re-evaluation of patient's condition   I assumed direction of critical care for this patient from another  provider in my specialty: no        Ottie Glazier, M.D.  [facility]

## 2020-09-09 NOTE — Consult Note (Addendum)
[facility] for Electrolyte Monitoring and Replacement   Recent Labs: Potassium (mmol/L)  Date Value  09/09/2020 3.9   Magnesium (mg/dL)  Date Value  09/09/2020 1.9   Calcium (mg/dL)  Date Value  09/09/2020 7.8 (L)   Albumin (g/dL)  Date Value  09/07/2020 3.3 (L)   Phosphorus (mg/dL)  Date Value  09/09/2020 2.1 (L)   Sodium (mmol/L)  Date Value  09/09/2020 136  05/14/2016 141   Assessment: Patient is a 75 y/o M with medical history including Parkinson's disease, lung cancer, depression / PTSD, recent SARS-CoV-2 infection who presented to the ED for evaluation of syncope. Noted to have multiple episodes of NSVT in the ED. QTc prolonged. Worsened on amiodarone and was changed to lidocaine drip. Pharmacy consulted to assist with electrolyte monitoring and replacement as indicated.  Patient also with NVD. Started on loperamide.  Goal of Therapy:  K > 4 Mg > 2 All other electrolytes within normal limits  Plan:  --K 3.9, ordered PO KCl 40 mEq BID --Mg 1.9, ordered IV magnesium sulfate 2 g x 1 --Phos 2.1, ordered IV sodium phosphate 15 mmol x 1 --BMP is ordered for 5pm. Will follow-up  Benita Gutter 09/09/2020 9:39 AM

## 2020-09-09 NOTE — Evaluation (Signed)
Physical Therapy Evaluation Patient Details Name: Darren Thomas. MRN: [id number] DOB: Sep 18, 1945 Today's Date: 09/09/2020   History of Present Illness  Pt is a 75 y/o M admitted on 09/07/20 for evaluation of syncope. Pt had 3 episodes of nonsustained VT in the ED associated with dizziness & lightheadedness, but no additional syncope. PMH: PD, lung CA, depression, PTSD, COVID 3 weeks ago, cervical spondylosis with myelopathy, insomnia  Clinical Impression  Pt seen for PT treatment with wife present. Pt is able to sit EOB ~5 minutes without LOB while taking meds from nurse. Pt is able to ambulate around bed x 2 times, pushing IV pole (pt uses SPC at home), with min assist. Pt demonstrates flexed posture in standing and would benefit from trialing SPC vs RW for gait (pt endorses 2 falls prior to admission). Will continue to follow pt acutely to progress gait & address balance & endurance.     Follow Up Recommendations Home health PT;Supervision for mobility/OOB    Equipment Recommendations  Rolling walker with 5" wheels    Recommendations for Other Services       Precautions / Restrictions Precautions Precautions: Fall Restrictions Weight Bearing Restrictions: No      Mobility  Bed Mobility Overal bed mobility: Needs Assistance Bed Mobility: Supine to Sit;Sit to Supine     Supine to sit: Min assist;HOB elevated (holds to PT's hands for support when attempting to upring trunk during supine>sit) Sit to supine: Supervision (uses momentum & requires extra time to transfer BLE up onto bed)        Transfers Overall transfer level: Needs assistance   Transfers: Sit to/from Stand Sit to Stand: Min assist;Min guard         General transfer comment: poor eccentric control with stand>sit, will push back heavily on EOB with BLE during sit>stand  Ambulation/Gait Ambulation/Gait assistance: Min guard;Min assist Gait Distance (Feet): 35 Feet (2 laps around bed in room) Assistive  device: IV Pole Gait Pattern/deviations: Decreased step length - right;Decreased step length - left;Decreased stride length Gait velocity: decreased      Stairs            Wheelchair Mobility    Modified Rankin (Stroke Patients Only)       Balance Overall balance assessment: Needs assistance Sitting-balance support: Feet supported Sitting balance-Leahy Scale: Good Sitting balance - Comments: no LOB sitting EOB   Standing balance support: Single extremity supported;During functional activity Standing balance-Leahy Scale: Fair                               Pertinent Vitals/Pain Pain Assessment: No/denies pain    Home Living Family/patient expects to be discharged to:: Private residence Living Arrangements: Spouse/significant other Available Help at Discharge: Family;Available 24 hours/day Type of Home:  (townhome) Home Access: Stairs to enter Entrance Stairs-Rails: Right;Left;Can reach both Entrance Stairs-Number of Steps: 5 Home Layout: One level Home Equipment: Cane - single point (rollator)      Prior Function Level of Independence: Needs assistance   Gait / Transfers Assistance Needed: ambulatory with SPC, endorses 2 falls in the past 6 months stating "I probably tripped over my own feet", occasional assistance for bed mobility, wife has been providing physical assistance since pt experienced physical decline leading up to admission           Hand Dominance        Extremity/Trunk Assessment        Lower  Extremity Assessment Lower Extremity Assessment: Generalized weakness (does not fully extend B knees in standing, but able to do so when asked to perform LAQ sitting EOB)    Cervical / Trunk Assessment Cervical / Trunk Assessment: Kyphotic  Communication      Cognition Arousal/Alertness: Awake/alert Behavior During Therapy: WFL for tasks assessed/performed Overall Cognitive Status: Within Functional Limits for tasks assessed                                         General Comments General comments (skin integrity, edema, etc.): cleared by MD for participation, max HR of 78 bpm during session, pt on room air with SPO2 reading as low as 64% during gait but pt in no apparent distress & denying SOB, unreliable pleth wave observed as well & once pt returned to sitting EOB SpO2 >90% within seconds (nurse notified)    Exercises     Assessment/Plan    PT Assessment Patient needs continued PT services  PT Problem List Decreased strength;Decreased mobility;Decreased activity tolerance;Decreased balance;Cardiopulmonary status limiting activity       PT Treatment Interventions DME instruction;Therapeutic activities;Modalities;Therapeutic exercise;Patient/family education;Gait training;Stair training;Balance training;Functional mobility training;Manual techniques;Neuromuscular re-education    PT Goals (Current goals can be found in the Care Plan section)  Acute Rehab PT Goals Patient Stated Goal: get better, go home PT Goal Formulation: With patient Time For Goal Achievement: 09/23/20 Potential to Achieve Goals: Good    Frequency Min 2X/week   Barriers to discharge        Co-evaluation               AM-PAC PT "6 Clicks" Mobility  Outcome Measure Help needed turning from your back to your side while in a flat bed without using bedrails?: None Help needed moving from lying on your back to sitting on the side of a flat bed without using bedrails?: A Little Help needed moving to and from a bed to a chair (including a wheelchair)?: A Little Help needed standing up from a chair using your arms (e.g., wheelchair or bedside chair)?: A Little Help needed to walk in hospital room?: A Little Help needed climbing 3-5 steps with a railing? : A Little 6 Click Score: 19    End of Session   Activity Tolerance: Patient tolerated treatment well Patient left: in bed;with call bell/phone within reach;with  family/visitor present (sitting upright in chair position) Nurse Communication: Mobility status PT Visit Diagnosis: Muscle weakness (generalized) (M62.81);Unsteadiness on feet (R26.81)    Time: 8413-2440 PT Time Calculation (min) (ACUTE ONLY): 31 min   Charges:   PT Evaluation $PT Eval Low Complexity: 1 Low PT Treatments $Therapeutic Activity: 8-22 mins        Darren Thomas, PT, DPT 09/09/20, 12:03 PM   Darren Thomas 09/09/2020, 12:02 PM

## 2020-09-09 NOTE — Progress Notes (Signed)
Progress Note  Patient Name: Darren Thomas. Date of Encounter: 09/09/2020  [facility] Cardiologist: New- Agbor-Etang rounding  Subjective   Feels much better, denies palpitations, appetite is much improved, currently eating lunch.  Inpatient Medications    Scheduled Meds: . acidophilus  2 capsule Oral TID  . buPROPion  300 mg Oral Daily  . carbidopa-levodopa  3 tablet Oral TID  . Chlorhexidine Gluconate Cloth  6 each Topical Q0600  . cyproheptadine  4 mg Oral Daily  . enoxaparin (LOVENOX) injection  40 mg Subcutaneous Q24H  . fluticasone  2 spray Each Nare Daily  . lamoTRIgine  100 mg Oral Daily  . loperamide  4 mg Oral Daily  . melatonin  5 mg Oral QHS  . metoprolol succinate  12.5 mg Oral Daily  . potassium chloride  40 mEq Oral BID  . sodium chloride flush  3 mL Intravenous Q12H   Continuous Infusions: . lidocaine 1 mg/min (09/09/20 1300)   PRN Meds: acetaminophen **OR** acetaminophen, alum & mag hydroxide-simeth, bisacodyl   Vital Signs    Vitals:   09/09/20 1100 09/09/20 1200 09/09/20 1300 09/09/20 1400  BP: 118/68 133/75 119/69 115/62  Pulse: (!) 53 (!) 55 60 (!) 33  Resp: 16 17 14 16   Temp:      TempSrc:      SpO2: 98% 98% 100% (!) 87%  Weight:      Height:        Intake/Output Summary (Last 24 hours) at 09/09/2020 1456 Last data filed at 09/09/2020 1300 Gross per 24 hour  Intake 3647.97 ml  Output 2900 ml  Net 747.97 ml   Last 3 Weights 09/09/2020 09/08/2020 09/07/2020  Weight (lbs) 157 lb 3 oz 156 lb 8.4 oz 155 lb 10.3 oz  Weight (kg) 71.3 kg 71 kg 70.6 kg      Telemetry    Sinus bradycardia, heart rate 58- Personally Reviewed  ECG    No new tracing obtained- Personally Reviewed  Physical Exam   GEN: No acute distress.   Neck: No JVD Cardiac: RRR, no murmurs, rubs, or gallops.  Respiratory:  poor inspiratory effort GI: Soft, nontender, non-distended  MS: No edema; No deformity. Neuro:  Nonfocal  Psych: Normal affect   Labs     High Sensitivity Troponin:   Recent Labs  Lab 09/07/20 2255 09/08/20 0533 09/08/20 0800  TROPONINIHS 7 9 8       Chemistry Recent Labs  Lab 09/07/20 2255 09/08/20 0533 09/08/20 2004 09/09/20 0134 09/09/20 0747  NA 137   < > 136 138 136  K 2.6*   < > 3.4* 4.1 3.9  CL 98   < > 104 108 105  CO2 29   < > 24 25 24   GLUCOSE 112*   < > 86 79 104*  BUN 19   < > 11 9 7*  CREATININE 0.84   < > 0.74 0.78 0.63  CALCIUM 8.4*   < > 7.8* 7.8* 7.8*  PROT 6.0*  --   --   --   --   ALBUMIN 3.3*  --   --   --   --   AST 21  --   --   --   --   ALT 9  --   --   --   --   ALKPHOS 60  --   --   --   --   BILITOT 1.0  --   --   --   --  GFRNONAA >60   < > >60 >60 >60  ANIONGAP 10   < > 8 5 7    < > = values in this interval not displayed.     Hematology Recent Labs  Lab 09/07/20 2255 09/08/20 0533 09/09/20 0412  WBC 9.4 8.7 6.8  RBC 4.59 3.98* 3.87*  HGB 13.4 11.7* 11.5*  HCT 37.7* 33.3* 33.1*  MCV 82.1 83.7 85.5  MCH 29.2 29.4 29.7  MCHC 35.5 35.1 34.7  RDW 14.1 14.1 14.6  PLT 284 217 187    BNPNo results for input(s): BNP, PROBNP in the last 168 hours.   DDimer No results for input(s): DDIMER in the last 168 hours.   Radiology    DG Chest 1 View  Result Date: 09/08/2020 CLINICAL DATA:  Initial evaluation for acute syncope. EXAM: CHEST  1 VIEW COMPARISON:  Prior radiograph from 06/15/2019. FINDINGS: Cardiac and mediastinal silhouettes are stable, and remain within normal limits. Aortic atherosclerosis. Lungs normally inflated. Scattered linear subsegmental atelectasis and/or scarring present at the bilateral lung bases. No focal infiltrates. No edema or effusion. No pneumothorax. Different related pads overlie the left lower chest. No acute osseous finding. Postsurgical changes partially visualize within the cervical spine. IMPRESSION: 1. Mild bibasilar linear subsegmental atelectasis and/or scarring. 2. No other active cardiopulmonary disease. 3.  Aortic Atherosclerosis  (ICD10-I70.0). Electronically Signed   By: Jeannine Boga M.D.   On: 09/08/2020 03:13    Cardiac Studies   TTE 09/09/2020 1. Left ventricular ejection fraction, by estimation, is 45 to 50%. The  left ventricle has mildly decreased function. The left ventricle has no  regional wall motion abnormalities. Left ventricular diastolic parameters  were normal.  2. Right ventricular systolic function is normal. The right ventricular  size is normal. There is normal pulmonary artery systolic pressure.  3. The mitral valve is grossly normal. Trivial mitral valve  regurgitation.  4. The aortic valve is grossly normal. Aortic valve regurgitation is not  visualized. Mild aortic valve sclerosis is present, with no evidence of  aortic valve stenosis.  5. The inferior vena cava is normal in size with greater than 50%  respiratory variability, suggesting right atrial pressure of 3 mmHg.   Patient Profile     75 y.o. male with history of Parkinson's disease, lung cancer stage IV, being seen due to syncope and ventricular tachycardia in the setting of hypokalemia, diarrhea.  Assessment & Plan    1.  Ventricular tachycardia. -In the setting of hypokalemia.  Amiodarone caused QRS widening, hence stopped. -No further episodes over the past 24 hours on lidocaine drip at 3mg /min. -Potassium repleted to normal, mag repleted -Reduce lidocaine drip to 1 mg/min -Start low-dose Toprol-XL -Echo with preserved EF, 45 to 50%, no gross structural abnormalities. -If no further arrhythmias, stop lidocaine in 24 hours, monitor patient, titrate beta-blocker as heart rate permits.  We will consider EP input in house if available versus outpatient.  2.  Diarrhea, COVID-19 infection -Management as per primary team  Critical care time was exclusive of separate billable procedures and treating other patients.  Critial care time was spent personally by me (independant of midlevel providers) on the following  activities: development of treatment plan,  evaluation of patients response to treatment, examining patient, reviewing treatments/ interventions, lab studies, radiographic studies, and re-evaluation of patients condition.      The patient is critically ill  and requires high complexity decision making for assessment and support, frequent evaluation and titration of therapies.   Critical care  was necessary to treat or prevent immintent or life-threatening deterioration.    Total CCT spent directly with the patient today is 30     Signed, Kate Sable, MD  09/09/2020, 2:56 PM

## 2020-09-10 DIAGNOSIS — E876 Hypokalemia: Secondary | ICD-10-CM | POA: Diagnosis not present

## 2020-09-10 DIAGNOSIS — R55 Syncope and collapse: Secondary | ICD-10-CM

## 2020-09-10 DIAGNOSIS — I429 Cardiomyopathy, unspecified: Secondary | ICD-10-CM

## 2020-09-10 DIAGNOSIS — I472 Ventricular tachycardia: Secondary | ICD-10-CM | POA: Diagnosis not present

## 2020-09-10 DIAGNOSIS — I428 Other cardiomyopathies: Secondary | ICD-10-CM

## 2020-09-10 DIAGNOSIS — E43 Unspecified severe protein-calorie malnutrition: Secondary | ICD-10-CM | POA: Insufficient documentation

## 2020-09-10 DIAGNOSIS — M1A9XX1 Chronic gout, unspecified, with tophus (tophi): Secondary | ICD-10-CM

## 2020-09-10 LAB — CBC
HCT: 35.3 % — ABNORMAL LOW (ref 39.0–52.0)
Hemoglobin: 12.2 g/dL — ABNORMAL LOW (ref 13.0–17.0)
MCH: 29.9 pg (ref 26.0–34.0)
MCHC: 34.6 g/dL (ref 30.0–36.0)
MCV: 86.5 fL (ref 80.0–100.0)
Platelets: 184 10*3/uL (ref 150–400)
RBC: 4.08 MIL/uL — ABNORMAL LOW (ref 4.22–5.81)
RDW: 15.1 % (ref 11.5–15.5)
WBC: 7.4 10*3/uL (ref 4.0–10.5)
nRBC: 0 % (ref 0.0–0.2)

## 2020-09-10 LAB — URINE CULTURE
Culture: >=100000 — AB
Special Requests: NORMAL

## 2020-09-10 LAB — BASIC METABOLIC PANEL
Anion gap: 5 (ref 5–15)
BUN: 9 mg/dL (ref 8–23)
CO2: 27 mmol/L (ref 22–32)
Calcium: 8 mg/dL — ABNORMAL LOW (ref 8.9–10.3)
Chloride: 106 mmol/L (ref 98–111)
Creatinine, Ser: 0.93 mg/dL (ref 0.61–1.24)
GFR, Estimated: >60 mL/min (ref >60)
Glucose, Bld: 81 mg/dL (ref 70–99)
Potassium: 4.1 mmol/L (ref 3.5–5.1)
Sodium: 138 mmol/L (ref 135–145)

## 2020-09-10 LAB — GLUCOSE, CAPILLARY
Glucose-Capillary: 107 mg/dL — ABNORMAL HIGH (ref 70–99)
Glucose-Capillary: 114 mg/dL — ABNORMAL HIGH (ref 70–99)
Glucose-Capillary: 129 mg/dL — ABNORMAL HIGH (ref 70–99)
Glucose-Capillary: 68 mg/dL — ABNORMAL LOW (ref 70–99)
Glucose-Capillary: 72 mg/dL (ref 70–99)
Glucose-Capillary: 73 mg/dL (ref 70–99)
Glucose-Capillary: 88 mg/dL (ref 70–99)

## 2020-09-10 LAB — MAGNESIUM: Magnesium: 2 mg/dL (ref 1.7–2.4)

## 2020-09-10 LAB — PHOSPHORUS: Phosphorus: 2.9 mg/dL (ref 2.5–4.6)

## 2020-09-10 MED ORDER — ADULT MULTIVITAMIN W/MINERALS CH
1.0000 | ORAL_TABLET | Freq: Every day | ORAL | Status: DC
  Administered 2020-09-11 – 2020-09-13 (×3): 1 via ORAL
  Filled 2020-09-10 (×3): qty 1

## 2020-09-10 MED ORDER — ENSURE ENLIVE PO LIQD
237.0000 mL | Freq: Three times a day (TID) | ORAL | Status: DC
  Administered 2020-09-10 – 2020-09-13 (×7): 237 mL via ORAL

## 2020-09-10 MED ORDER — POTASSIUM CHLORIDE 20 MEQ PO PACK
40.0000 meq | PACK | Freq: Every day | ORAL | Status: DC
  Administered 2020-09-10 – 2020-09-13 (×4): 40 meq via ORAL
  Filled 2020-09-10 (×3): qty 2

## 2020-09-10 MED ORDER — SODIUM CHLORIDE 0.9 % IV SOLN
1.0000 g | INTRAVENOUS | Status: DC
  Administered 2020-09-10: 1 g via INTRAVENOUS
  Filled 2020-09-10: qty 1

## 2020-09-10 MED ORDER — LOPERAMIDE HCL 2 MG PO CAPS
4.0000 mg | ORAL_CAPSULE | Freq: Two times a day (BID) | ORAL | Status: DC
  Administered 2020-09-10 – 2020-09-11 (×4): 4 mg via ORAL
  Filled 2020-09-10 (×5): qty 2

## 2020-09-10 MED ORDER — SODIUM CHLORIDE 0.9% FLUSH
3.0000 mL | Freq: Two times a day (BID) | INTRAVENOUS | Status: DC
  Administered 2020-09-10 – 2020-09-13 (×5): 3 mL via INTRAVENOUS

## 2020-09-10 MED ORDER — LOPERAMIDE HCL 2 MG PO CAPS
4.0000 mg | ORAL_CAPSULE | Freq: Two times a day (BID) | ORAL | Status: DC | PRN
  Filled 2020-09-10: qty 2

## 2020-09-10 MED ORDER — CEPHALEXIN 250 MG PO CAPS
250.0000 mg | ORAL_CAPSULE | Freq: Four times a day (QID) | ORAL | Status: DC
  Administered 2020-09-11 – 2020-09-13 (×8): 250 mg via ORAL
  Filled 2020-09-10 (×11): qty 1

## 2020-09-10 NOTE — H&P (View-Only) (Signed)
Progress Note  Patient Name: Darren Thomas. Date of Encounter: 09/10/2020  [facility] Cardiologist: Agbor-Etang  Subjective   Patient feels well, denying chest pain, shortness of breath, palpitations, and lightheadedness.  Inpatient Medications    Scheduled Meds: . acidophilus  2 capsule Oral TID  . buPROPion  300 mg Oral Daily  . carbidopa-levodopa  3 tablet Oral TID  . [START ON 09/11/2020] cephALEXin  250 mg Oral Q6H  . Chlorhexidine Gluconate Cloth  6 each Topical Q0600  . cyproheptadine  4 mg Oral Daily  . enoxaparin (LOVENOX) injection  40 mg Subcutaneous Q24H  . feeding supplement  237 mL Oral TID BM  . fluticasone  2 spray Each Nare Daily  . loperamide  4 mg Oral BID  . melatonin  5 mg Oral QHS  . metoprolol succinate  12.5 mg Oral Daily  . [START ON 09/11/2020] multivitamin with minerals  1 tablet Oral Daily  . potassium chloride  40 mEq Oral Daily  . sodium chloride flush  3 mL Intravenous Q12H   Continuous Infusions:  PRN Meds: acetaminophen **OR** acetaminophen, alum & mag hydroxide-simeth, bisacodyl, loperamide   Vital Signs    Vitals:   09/10/20 1500 09/10/20 1600 09/10/20 1700 09/10/20 1800  BP: (!) 103/55 (!) 108/54 113/61 (!) 104/52  Pulse: (!) 44 (!) 54 77 (!) 154  Resp: 16 19 16 18   Temp:  (!) 97.2 F (36.2 C)    TempSrc:  Oral    SpO2: 99% 98% 99% 99%  Weight:      Height:        Intake/Output Summary (Last 24 hours) at 09/10/2020 1900 Last data filed at 09/10/2020 1600 Gross per 24 hour  Intake 798.16 ml  Output 2050 ml  Net -1251.84 ml   Last 3 Weights 09/10/2020 09/09/2020 09/08/2020  Weight (lbs) 157 lb 3 oz 157 lb 3 oz 156 lb 8.4 oz  Weight (kg) 71.3 kg 71.3 kg 71 kg      Telemetry    Sinus bradycardia- Personally Reviewed  ECG    No new tracing.  Physical Exam   GEN: No acute distress.   Neck: No JVD Cardiac: RRR, no murmurs, rubs, or gallops.  Respiratory: Clear to auscultation bilaterally. GI: Soft, nontender,  non-distended  MS: No edema; No deformity. Neuro:  Nonfocal  Psych: Normal affect   Labs    High Sensitivity Troponin:   Recent Labs  Lab 09/07/20 2255 09/08/20 0533 09/08/20 0800  TROPONINIHS 7 9 8       Chemistry Recent Labs  Lab 09/07/20 2255 09/08/20 0533 09/09/20 0747 09/09/20 1715 09/10/20 0448  NA 137   < > 136 136 138  K 2.6*   < > 3.9 4.0 4.1  CL 98   < > 105 102 106  CO2 29   < > 24 27 27   GLUCOSE 112*   < > 104* 98 81  BUN 19   < > 7* 8 9  CREATININE 0.84   < > 0.63 0.88 0.93  CALCIUM 8.4*   < > 7.8* 8.0* 8.0*  PROT 6.0*  --   --   --   --   ALBUMIN 3.3*  --   --   --   --   AST 21  --   --   --   --   ALT 9  --   --   --   --   ALKPHOS 60  --   --   --   --  BILITOT 1.0  --   --   --   --   GFRNONAA >60   < > >60 >60 >60  ANIONGAP 10   < > 7 7 5    < > = values in this interval not displayed.     Hematology Recent Labs  Lab 09/08/20 0533 09/09/20 0412 09/10/20 0448  WBC 8.7 6.8 7.4  RBC 3.98* 3.87* 4.08*  HGB 11.7* 11.5* 12.2*  HCT 33.3* 33.1* 35.3*  MCV 83.7 85.5 86.5  MCH 29.4 29.7 29.9  MCHC 35.1 34.7 34.6  RDW 14.1 14.6 15.1  PLT 217 187 184    BNPNo results for input(s): BNP, PROBNP in the last 168 hours.   DDimer No results for input(s): DDIMER in the last 168 hours.   Radiology    ECHOCARDIOGRAM COMPLETE  Result Date: 09/09/2020    ECHOCARDIOGRAM REPORT   Patient Name:   Darren Thomas. Date of Exam: 09/09/2020 Medical Rec #:  [id number]         Height:       74.0 in Accession #:    [id number]        Weight:       157.2 lb Date of Birth:  03-01-1946         BSA:          1.962 m Patient Age:    75 years          BP: Patient Gender: M                 HR: Exam Location: Indications:     R55 Syncope; R00.0 Tachycardia  Referring Phys:  [id number] ALEXIS HUGELMEYER Diagnosing Phys: Kate Sable MD IMPRESSIONS  1. Left ventricular ejection fraction, by estimation, is 45 to 50%. The left ventricle has mildly decreased function. The left  ventricle has no regional wall motion abnormalities. Left ventricular diastolic parameters were normal.  2. Right ventricular systolic function is normal. The right ventricular size is normal. There is normal pulmonary artery systolic pressure.  3. The mitral valve is grossly normal. Trivial mitral valve regurgitation.  4. The aortic valve is grossly normal. Aortic valve regurgitation is not visualized. Mild aortic valve sclerosis is present, with no evidence of aortic valve stenosis.  5. The inferior vena cava is normal in size with greater than 50% respiratory variability, suggesting right atrial pressure of 3 mmHg. FINDINGS  Left Ventricle: Left ventricular ejection fraction, by estimation, is 45 to 50%. The left ventricle has mildly decreased function. The left ventricle has no regional wall motion abnormalities. The left ventricular internal cavity size was normal in size. There is no left ventricular hypertrophy. Left ventricular diastolic parameters were normal. Right Ventricle: The right ventricular size is normal. No increase in right ventricular wall thickness. Right ventricular systolic function is normal. There is normal pulmonary artery systolic pressure. The tricuspid regurgitant velocity is 2.32 m/s, and  with an assumed right atrial pressure of 3 mmHg, the estimated right ventricular systolic pressure is 08.1 mmHg. Left Atrium: Left atrial size was normal in size. Right Atrium: Right atrial size was normal in size. Pericardium: There is no evidence of pericardial effusion. Mitral Valve: The mitral valve is grossly normal. Trivial mitral valve regurgitation. Tricuspid Valve: The tricuspid valve is grossly normal. Tricuspid valve regurgitation is trivial. Aortic Valve: The aortic valve is grossly normal. Aortic valve regurgitation is not visualized. Mild aortic valve sclerosis is present, with no evidence of aortic valve stenosis. Aortic valve mean gradient  measures 6.0 mmHg. Aortic valve peak gradient  measures 10.0 mmHg. Aortic valve area, by VTI measures 2.15 cm. Pulmonic Valve: The pulmonic valve was not well visualized. Pulmonic valve regurgitation is not visualized. Aorta: The aortic root is normal in size and structure. Venous: The inferior vena cava is normal in size with greater than 50% respiratory variability, suggesting right atrial pressure of 3 mmHg. IAS/Shunts: No atrial level shunt detected by color flow Doppler.  LEFT VENTRICLE PLAX 2D LVIDd:         4.27 cm  Diastology LVIDs:         3.64 cm  LV e' medial:    6.64 cm/s LV PW:         0.91 cm  LV E/e' medial:  15.8 LV IVS:        1.03 cm  LV e' lateral:   10.20 cm/s LVOT diam:     2.00 cm  LV E/e' lateral: 10.3 LV SV:         78 LV SV Index:   40 LVOT Area:     3.14 cm  RIGHT VENTRICLE RV S prime:     12.20 cm/s TAPSE (M-mode): 2.1 cm LEFT ATRIUM             Index LA diam:        3.00 cm 1.53 cm/m LA Vol (A2C):   61.7 ml 31.45 ml/m LA Vol (A4C):   36.4 ml 18.55 ml/m LA Biplane Vol: 47.3 ml 24.11 ml/m  AORTIC VALVE                    PULMONIC VALVE AV Area (Vmax):    2.33 cm     PV Vmax:       0.86 m/s AV Area (Vmean):   1.87 cm     PV Peak grad:  3.0 mmHg AV Area (VTI):     2.15 cm AV Vmax:           158.00 cm/s AV Vmean:          121.000 cm/s AV VTI:            0.362 m AV Peak Grad:      10.0 mmHg AV Mean Grad:      6.0 mmHg LVOT Vmax:         117.00 cm/s LVOT Vmean:        71.900 cm/s LVOT VTI:          0.248 m LVOT/AV VTI ratio: 0.69  AORTA Ao Root diam: 3.40 cm MITRAL VALVE                TRICUSPID VALVE MV Area (PHT): 2.53 cm     TR Peak grad:   21.5 mmHg MV E velocity: 105.00 cm/s  TR Vmax:        232.00 cm/s MV A velocity: 73.70 cm/s MV E/A ratio:  1.42         SHUNTS                             Systemic VTI:  0.25 m                             Systemic Diam: 2.00 cm Kate Sable MD Electronically signed by Kate Sable MD Signature Date/Time: 09/09/2020/3:11:05 PM    Final     Cardiac Studies  TTE (09/09/2020): 1.  Left ventricular ejection fraction, by estimation, is 45 to 50%. The  left ventricle has mildly decreased function. The left ventricle has no  regional wall motion abnormalities. Left ventricular diastolic parameters  were normal.  2. Right ventricular systolic function is normal. The right ventricular  size is normal. There is normal pulmonary artery systolic pressure.  3. The mitral valve is grossly normal. Trivial mitral valve  regurgitation.  4. The aortic valve is grossly normal. Aortic valve regurgitation is not  visualized. Mild aortic valve sclerosis is present, with no evidence of  aortic valve stenosis.  5. The inferior vena cava is normal in size with greater than 50%  respiratory variability, suggesting right atrial pressure of 3 mmHg.  Patient Profile     75 y.o. male with history of stage IV lung cancer and Parkinson's disease, admitted with syncope and found to have sustained ventricular tachycardia.  Assessment & Plan    Ventricular tachycardia: Telemetry during admission reviewed.  He had an episode of sustained ventricular tachycardia on 09/08/2020 that lasted approximately 50 seconds.  It was preceded by a shorter episode of NSVT.  Sustained VT appears polymorphic.  Subsequent EKGs show significant QT prolongation that was thought to have been exacerbated by administration of amiodarone.  Patient's VT has been quiescent on lidocaine since then.  He was also started on low-dose metoprolol yesterday.  Discontinue lidocaine infusion and continue monitoring in stepdown.  In the setting of significant bradycardia and what appears to be torsades to [facility] that can be precipitated by R-on-T phenomenon, I would favor discontinuation of beta-blocker at this time.  Maintain potassium and magnesium levels greater than 4.0 and 2.0, respectively.  Repeat EKG in the morning to assess QT interval.  Plan for left heart catheterization with possible PCI tomorrow to exclude  underlying ischemic substrate in the setting of sustained VT and reduced LVEF.  Patient has agreed to rescind his DNR for the procedure.  EP consultation on Wednesday.  Cardiomyopathy: LVEF mildly reduced as noted above.  Plan for left heart catheterization with possible PCI tomorrow.  Hold metoprolol succinate in the setting of significant bradycardia.  Defer adding ACE inhibitor/ARB in the setting of intermittent hypotension.  COVID-19: Incidentally discovered.  Patient is asymptomatic other than nausea and diarrhea that likely led to marked electrolyte derangements.  Per internal medicine.  Lung cancer: Patient reports good response to [facility] though details regarding his disease status and prognosis is uncertain.  I have touched base with Dr. Birdena Crandall regarding reaching out to the [facility] for further information.  This will be particularly important when determining what therapies Mr. Behrens may be a candidate for in regard to his VT.  Shared Decision Making/Informed Consent The risks [stroke (1 in 1000), death (1 in 1000), kidney failure [usually temporary] (1 in 500), bleeding (1 in 200), allergic reaction [possibly serious] (1 in 200)], benefits (diagnostic support and management of coronary artery disease) and alternatives of a cardiac catheterization were discussed in detail with Mr. Decesare and he is willing to proceed.     For questions or updates, please contact [place] Please consult [email or web address] for contact info under [facility].      Signed, Nelva Bush, MD  09/10/2020, 7:00 PM

## 2020-09-10 NOTE — Progress Notes (Signed)
SLP Cancellation Note  Patient Details Name: Dacoda Finlay. MRN: [id number] DOB: February 12, 1946   Cancelled treatment:       Reason Eval/Treat Not Completed: SLP screened, no needs identified, will sign off (chart reviewed; consulted NSG then met w/ pt/Wife in room)  Pt denied any difficulty swallowing and is currently on a regular diet; tolerates swallowing pills w/ water but does Best when using Applesauce(puree) to swallow Pills, per pt and NSG. Pt conversed at conversational level w/out deficits noted; pt and Wife denied any speech-language deficits.  No further skilled ST services indicated as pt appears at his baseline. Recommend Pills Whole in Puree. Pt agreed. NSG to reconsult if any change in status while admitted.      Orinda Kenner, MS, [place] Speech Language Pathologist Rehab Services [phone] [facility] 09/10/2020, 3:28 PM

## 2020-09-10 NOTE — Progress Notes (Addendum)
PROGRESS NOTE    Darren Thomas.  GUR:[id number] DOB: 05-14-1945 DOA: 09/07/2020 PCP: Steele Sizer, MD  [facility]    Brief Narrative:   Darren Thomas is a 75 y.o. male with a known history of Parkinson's, lung C, depression/PTSD, COVID 3 weeks ago presents to the emergency department for evaluation of syncope.  Patient was in a usual state of health until recent COVID infection, he has not fully recovered his appetite, and has been having N, V/D  Patient sustained a syncope epiosde at home today while sitting in a chair.  Wife witnessed and denies seizure like activity.   Of note, he has had three episodes on nonsustained VT in the ER associated ith dizziness and lightheadedness, but no additional syncope.  All of his prior care has been at the [place].   Patient denies fevers/chills, weakness, dizziness, chest pain, shortness of breath abdominal pain, dysuria/frequency, changes in mental status.    Otherwise there has been no change in status. Patient has been taking medication as prescribed and there has been no recent change in medication or diet.  No recent antibiotics.  There has been no recent illness, hospitalizations, travel or sick contacts.      Assessment & Plan:   Active Problems:   Ventricular tachycardia (HCC)   Hypokalemia   Syncope   #. Syncope 2/2 VT Severe hypokalemia likely contributory, immunotherapy may also contribute. Worsened w/ amio. Treated then w/ lido gtt, weaned off this am. Ef 45-50 - cardiology and critical care consulted - transfer to floor is stable today - cardiology has started metoprolol - treating hypokalemia as below - DNR - cards considering EP consult  # Hypokalemia # Hypophosphatemia 2/2 recent weeks of decreased PO, nausea, diarrhea. K 2.6 on presentation. Now resolved w/ aggressive supplementation. Tolerating PO. Mg wnl.  - monitor - oral kcl 40 qd  # Nausea, vomiting, diarrhea Likely 2/2 lung cancer and its  treatment. Gi pathogen panel neg. . No sig abd pain/tenderness so holding on scan for now. Pt says stool more formed yesterday and today - monitor - will start loperamide - holding off on c diff as stool more formed   #. UTI, symptomatic Klebsiella, sensitivities have returned - transition to keflex, last day 5/4  # Covid positive Appears asymptomatic, no o2 requirement. Symptoms 4/15, diagnosed 4/17. - monitor - airborne and contact precautions  # prolonged qtc In the 600s - avoid qtc prolongers. Home donepezil and trazodone held. Will also hold home escitalopram  #. History of Parkinsons - Continue Sinemet   #. History of depression/PTSD - Continue Bupropion. Hold escitalopram as above  # Lung cancer Followed at [place], on [place] every 6 wks, last dose early April   DVT prophylaxis: lovenox Code Status: dnr Family Communication: wife updated @ bedside 4/30  Level of care: Stepdown Status is: Inpatient  Remains inpatient appropriate because:Inpatient level of care appropriate due to severity of illness   Dispo: The patient is from: Home              Anticipated d/c is to: tbd              Patient currently is not medically stable to d/c.   Difficult to place patient No        Consultants:  Critical care, cardiology  Procedures: none  Antimicrobials:  Ceftriaxone 4/29>    Subjective: This morning feels a bit better. Tolerated breakfast no voming. Had loose stool this am. No chest pain  or palpitations or dyspena. improving  Objective: Vitals:   09/10/20 0400 09/10/20 0500 09/10/20 0600 09/10/20 0800  BP: 120/61 108/65 (!) 119/57 98/71  Pulse: (!) 41 (!) 43 (!) 43 (!) 105  Resp: 12 19 13 13   Temp:    (!) 97 F (36.1 C)  TempSrc:    Oral  SpO2: 99% 95% 100% 93%  Weight:  71.3 kg    Height:        Intake/Output Summary (Last 24 hours) at 09/10/2020 0947 Last data filed at 09/10/2020 0400 Gross per 24 hour  Intake 1489.99 ml  Output 2450 ml   Net -960.01 ml   Filed Weights   09/08/20 1005 09/09/20 0500 09/10/20 0500  Weight: 71 kg 71.3 kg 71.3 kg    Examination:  General exam: Appears calm, frail Respiratory system: Clear to auscultation save for faint rales at bases Cardiovascular system: S1 & S2 heard, bradycardic, soft systolic murmur Gastrointestinal system: Abdomen is nondistended, soft and nontender. No organomegaly or masses felt. Normal bowel sounds heard. Central nervous system: Alert and oriented. No focal neurological deficits. Extremities: trace LE edema Skin: No rashes, lesions or ulcers Psychiatry: Judgement and insight appear normal. Appears depressed     Data Reviewed: I have personally reviewed following labs and imaging studies  CBC: Recent Labs  Lab 09/07/20 2255 09/08/20 0533 09/09/20 0412 09/10/20 0448  WBC 9.4 8.7 6.8 7.4  HGB 13.4 11.7* 11.5* 12.2*  HCT 37.7* 33.3* 33.1* 35.3*  MCV 82.1 83.7 85.5 86.5  PLT 284 217 187 297   Basic Metabolic Panel: Recent Labs  Lab 09/07/20 2255 09/08/20 0533 09/08/20 0800 09/08/20 2004 09/09/20 0134 09/09/20 0412 09/09/20 0747 09/09/20 1715 09/10/20 0448  NA 137 137   < > 136 138  --  136 136 138  K 2.6* 2.6*   < > 3.4* 4.1  --  3.9 4.0 4.1  CL 98 99   < > 104 108  --  105 102 106  CO2 29 27   < > 24 25  --  24 27 27   GLUCOSE 112* 83   < > 86 79  --  104* 98 81  BUN 19 15   < > 11 9  --  7* 8 9  CREATININE 0.84 0.98  0.96   < > 0.74 0.78  --  0.63 0.88 0.93  CALCIUM 8.4* 7.6*   < > 7.8* 7.8*  --  7.8* 8.0* 8.0*  MG 1.9 2.3  --   --   --  1.9  --   --  2.0  PHOS  --  3.2  --   --   --  2.1*  --   --  2.9   < > = values in this interval not displayed.   GFR: Estimated Creatinine Clearance: 67.1 mL/min (by C-G formula based on SCr of 0.93 mg/dL). Liver Function Tests: Recent Labs  Lab 09/07/20 2255  AST 21  ALT 9  ALKPHOS 60  BILITOT 1.0  PROT 6.0*  ALBUMIN 3.3*   No results for input(s): LIPASE, AMYLASE in the last 168  hours. No results for input(s): AMMONIA in the last 168 hours. Coagulation Profile: No results for input(s): INR, PROTIME in the last 168 hours. Cardiac Enzymes: Recent Labs  Lab 09/07/20 2255  CKTOTAL 166   BNP (last 3 results) No results for input(s): PROBNP in the last 8760 hours. HbA1C: No results for input(s): HGBA1C in the last 72 hours. CBG: Recent Labs  Lab 09/09/20 1917 09/09/20 2315 09/10/20 0503 09/10/20 0724 09/10/20 0757  GLUCAP 96 95 73 68* 72   Lipid Profile: Recent Labs    09/08/20 0533  CHOL 109  HDL 29*  LDLCALC 69  TRIG 53  CHOLHDL 3.8   Thyroid Function Tests: Recent Labs    09/08/20 0533  TSH 3.265   Anemia Panel: No results for input(s): VITAMINB12, FOLATE, FERRITIN, TIBC, IRON, RETICCTPCT in the last 72 hours. Urine analysis:    Component Value Date/Time   COLORURINE AMBER (A) 09/07/2020 2255   APPEARANCEUR CLOUDY (A) 09/07/2020 2255   LABSPEC 1.024 09/07/2020 2255   PHURINE 6.0 09/07/2020 [place] 09/07/2020 2255   HGBUR NEGATIVE 09/07/2020 2255   BILIRUBINUR NEGATIVE 09/07/2020 2255   KETONESUR 80 (A) 09/07/2020 2255   PROTEINUR 30 (A) 09/07/2020 2255   NITRITE POSITIVE (A) 09/07/2020 2255   LEUKOCYTESUR SMALL (A) 09/07/2020 2255   Sepsis Labs: @LABRCNTIP (procalcitonin:4,lacticidven:4)  ) Recent Results (from the past 240 hour(s))  Urine Culture     Status: Abnormal   Collection Time: 09/07/20 10:55 PM   Specimen: Urine, Random  Result Value Ref Range Status   Specimen Description   Final    URINE, RANDOM Performed at [facility], [address] [place], [place] [address]    Special Requests   Final    Normal Performed at [facility], [address] [place], [place] [address]    Culture >=100,000 COLONIES/mL KLEBSIELLA PNEUMONIAE (A)  Final   Report Status 09/10/2020 FINAL  Final   Organism ID, Bacteria KLEBSIELLA PNEUMONIAE (A)  Final      Susceptibility   Klebsiella  pneumoniae - MIC*    AMPICILLIN RESISTANT Resistant     CEFAZOLIN <=4 SENSITIVE Sensitive     CEFEPIME <=0.12 SENSITIVE Sensitive     CEFTRIAXONE <=0.25 SENSITIVE Sensitive     CIPROFLOXACIN <=0.25 SENSITIVE Sensitive     GENTAMICIN <=1 SENSITIVE Sensitive     IMIPENEM <=0.25 SENSITIVE Sensitive     NITROFURANTOIN 64 INTERMEDIATE Intermediate     TRIMETH/SULFA <=20 SENSITIVE Sensitive     AMPICILLIN/SULBACTAM <=2 SENSITIVE Sensitive     PIP/TAZO <=4 SENSITIVE Sensitive     * >=100,000 COLONIES/mL KLEBSIELLA PNEUMONIAE  Resp Panel by RT-PCR (Flu A&B, Covid) Nasopharyngeal Swab     Status: Abnormal   Collection Time: 09/08/20 12:58 AM   Specimen: Nasopharyngeal Swab; Nasopharyngeal(NP) swabs in vial transport medium  Result Value Ref Range Status   SARS Coronavirus 2 by RT PCR POSITIVE (A) NEGATIVE Final    Comment: CRITICAL RESULT CALLED TO, READ BACK BY AND VERIFIED WITH: JUAN HENDERSON AT 0156 09/08/20 MF (NOTE) SARS-CoV-2 target nucleic acids are DETECTED.  The SARS-CoV-2 RNA is generally detectable in upper respiratory specimens during the acute phase of infection. Positive results are indicative of the presence of the identified virus, but do not rule out bacterial infection or co-infection with other pathogens not detected by the test. Clinical correlation with patient history and other diagnostic information is necessary to determine patient infection status. The expected result is Negative.  Fact Sheet for Patients: EntrepreneurPulse.com.au  Fact Sheet for Healthcare Providers: IncredibleEmployment.be  This test is not yet approved or cleared by the [place] FDA and  has been authorized for detection and/or diagnosis of SARS-CoV-2 by FDA under an Emergency Use Authorization (EUA).  This EUA will remain in effect (meaning this te st can be used) for the duration of  the COVID-19 declaration under Section 564(b)(1)  of the Act,  21 U.S.C. section 360bbb-3(b)(1), unless the authorization is terminated or revoked sooner.     Influenza A by PCR NEGATIVE NEGATIVE Final   Influenza B by PCR NEGATIVE NEGATIVE Final    Comment: (NOTE) The Xpert Xpress SARS-CoV-2/FLU/RSV plus assay is intended as an aid in the diagnosis of influenza from Nasopharyngeal swab specimens and should not be used as a sole basis for treatment. Nasal washings and aspirates are unacceptable for Xpert Xpress SARS-CoV-2/FLU/RSV testing.  Fact Sheet for Patients: EntrepreneurPulse.com.au  Fact Sheet for Healthcare Providers: IncredibleEmployment.be  This test is not yet approved or cleared by the [place] FDA and has been authorized for detection and/or diagnosis of SARS-CoV-2 by FDA under an Emergency Use Authorization (EUA). This EUA will remain in effect (meaning this test can be used) for the duration of the COVID-19 declaration under Section 564(b)(1) of the Act, 21 U.S.C. section 360bbb-3(b)(1), unless the authorization is terminated or revoked.  Performed at [facility], [address] [place], [place] [address]   MRSA PCR Screening     Status: None   Collection Time: 09/08/20 10:14 AM   Specimen: Nasal Mucosa; Nasopharyngeal  Result Value Ref Range Status   MRSA by PCR NEGATIVE NEGATIVE Final    Comment:        The GeneXpert MRSA Assay (FDA approved for NASAL specimens only), is one component of a comprehensive MRSA colonization surveillance program. It is not intended to diagnose MRSA infection nor to guide or monitor treatment for MRSA infections. Performed at [facility], [address] [place], [place] [address]   Gastrointestinal Panel by PCR , Stool     Status: None   Collection Time: 09/08/20 11:00 PM   Specimen: Stool  Result Value Ref Range Status   Campylobacter species NOT DETECTED NOT DETECTED Final   Plesimonas shigelloides NOT DETECTED NOT  DETECTED Final   Salmonella species NOT DETECTED NOT DETECTED Final   Yersinia enterocolitica NOT DETECTED NOT DETECTED Final   Vibrio species NOT DETECTED NOT DETECTED Final   Vibrio cholerae NOT DETECTED NOT DETECTED Final   Enteroaggregative E coli (EAEC) NOT DETECTED NOT DETECTED Final   Enteropathogenic E coli (EPEC) NOT DETECTED NOT DETECTED Final   Enterotoxigenic E coli (ETEC) NOT DETECTED NOT DETECTED Final   Shiga like toxin producing E coli (STEC) NOT DETECTED NOT DETECTED Final   Shigella/Enteroinvasive E coli (EIEC) NOT DETECTED NOT DETECTED Final   Cryptosporidium NOT DETECTED NOT DETECTED Final   Cyclospora cayetanensis NOT DETECTED NOT DETECTED Final   Entamoeba histolytica NOT DETECTED NOT DETECTED Final   Giardia lamblia NOT DETECTED NOT DETECTED Final   Adenovirus F40/41 NOT DETECTED NOT DETECTED Final   Astrovirus NOT DETECTED NOT DETECTED Final   Norovirus GI/GII NOT DETECTED NOT DETECTED Final   Rotavirus A NOT DETECTED NOT DETECTED Final   Sapovirus (I, II, IV, and V) NOT DETECTED NOT DETECTED Final    Comment: Performed at [facility], [address] [place], [place] [address]         Radiology Studies: ECHOCARDIOGRAM COMPLETE  Result Date: 09/09/2020    ECHOCARDIOGRAM REPORT   Patient Name:   Darren Thomas. Date of Exam: 09/09/2020 Medical Rec #:  [id number]         Height:       74.0 in Accession #:    [id number]        Weight:       157.2 lb Date of Birth:  August 13, 1945         BSA:          1.962 m Patient Age:    65 years          BP: Patient Gender: M                 HR: Exam Location: Indications:     R55 Syncope; R00.0 Tachycardia  Referring Phys:  [id number] ALEXIS HUGELMEYER Diagnosing Phys: Kate Sable MD IMPRESSIONS  1. Left ventricular ejection fraction, by estimation, is 45 to 50%. The left ventricle has mildly decreased function. The left ventricle has no regional wall motion abnormalities. Left ventricular diastolic parameters were  normal.  2. Right ventricular systolic function is normal. The right ventricular size is normal. There is normal pulmonary artery systolic pressure.  3. The mitral valve is grossly normal. Trivial mitral valve regurgitation.  4. The aortic valve is grossly normal. Aortic valve regurgitation is not visualized. Mild aortic valve sclerosis is present, with no evidence of aortic valve stenosis.  5. The inferior vena cava is normal in size with greater than 50% respiratory variability, suggesting right atrial pressure of 3 mmHg. FINDINGS  Left Ventricle: Left ventricular ejection fraction, by estimation, is 45 to 50%. The left ventricle has mildly decreased function. The left ventricle has no regional wall motion abnormalities. The left ventricular internal cavity size was normal in size. There is no left ventricular hypertrophy. Left ventricular diastolic parameters were normal. Right Ventricle: The right ventricular size is normal. No increase in right ventricular wall thickness. Right ventricular systolic function is normal. There is normal pulmonary artery systolic pressure. The tricuspid regurgitant velocity is 2.32 m/s, and  with an assumed right atrial pressure of 3 mmHg, the estimated right ventricular systolic pressure is 96.7 mmHg. Left Atrium: Left atrial size was normal in size. Right Atrium: Right atrial size was normal in size. Pericardium: There is no evidence of pericardial effusion. Mitral Valve: The mitral valve is grossly normal. Trivial mitral valve regurgitation. Tricuspid Valve: The tricuspid valve is grossly normal. Tricuspid valve regurgitation is trivial. Aortic Valve: The aortic valve is grossly normal. Aortic valve regurgitation is not visualized. Mild aortic valve sclerosis is present, with no evidence of aortic valve stenosis. Aortic valve mean gradient measures 6.0 mmHg. Aortic valve peak gradient measures 10.0 mmHg. Aortic valve area, by VTI measures 2.15 cm. Pulmonic Valve: The pulmonic  valve was not well visualized. Pulmonic valve regurgitation is not visualized. Aorta: The aortic root is normal in size and structure. Venous: The inferior vena cava is normal in size with greater than 50% respiratory variability, suggesting right atrial pressure of 3 mmHg. IAS/Shunts: No atrial level shunt detected by color flow Doppler.  LEFT VENTRICLE PLAX 2D LVIDd:         4.27 cm  Diastology LVIDs:         3.64 cm  LV e' medial:    6.64 cm/s LV PW:         0.91 cm  LV E/e' medial:  15.8 LV IVS:        1.03 cm  LV e' lateral:   10.20 cm/s LVOT diam:     2.00 cm  LV E/e' lateral: 10.3 LV SV:         78 LV SV Index:   40 LVOT Area:     3.14 cm  RIGHT VENTRICLE RV S prime:     12.20 cm/s TAPSE (M-mode): 2.1 cm LEFT ATRIUM  Index LA diam:        3.00 cm 1.53 cm/m LA Vol (A2C):   61.7 ml 31.45 ml/m LA Vol (A4C):   36.4 ml 18.55 ml/m LA Biplane Vol: 47.3 ml 24.11 ml/m  AORTIC VALVE                    PULMONIC VALVE AV Area (Vmax):    2.33 cm     PV Vmax:       0.86 m/s AV Area (Vmean):   1.87 cm     PV Peak grad:  3.0 mmHg AV Area (VTI):     2.15 cm AV Vmax:           158.00 cm/s AV Vmean:          121.000 cm/s AV VTI:            0.362 m AV Peak Grad:      10.0 mmHg AV Mean Grad:      6.0 mmHg LVOT Vmax:         117.00 cm/s LVOT Vmean:        71.900 cm/s LVOT VTI:          0.248 m LVOT/AV VTI ratio: 0.69  AORTA Ao Root diam: 3.40 cm MITRAL VALVE                TRICUSPID VALVE MV Area (PHT): 2.53 cm     TR Peak grad:   21.5 mmHg MV E velocity: 105.00 cm/s  TR Vmax:        232.00 cm/s MV A velocity: 73.70 cm/s MV E/A ratio:  1.42         SHUNTS                             Systemic VTI:  0.25 m                             Systemic Diam: 2.00 cm Kate Sable MD Electronically signed by Kate Sable MD Signature Date/Time: 09/09/2020/3:11:05 PM    Final         Scheduled Meds: . acidophilus  2 capsule Oral TID  . buPROPion  300 mg Oral Daily  . carbidopa-levodopa  3 tablet Oral TID  .  Chlorhexidine Gluconate Cloth  6 each Topical Q0600  . cyproheptadine  4 mg Oral Daily  . enoxaparin (LOVENOX) injection  40 mg Subcutaneous Q24H  . fluticasone  2 spray Each Nare Daily  . lamoTRIgine  100 mg Oral Daily  . loperamide  4 mg Oral BID  . melatonin  5 mg Oral QHS  . metoprolol succinate  12.5 mg Oral Daily  . potassium chloride  40 mEq Oral BID  . sodium chloride flush  3 mL Intravenous Q12H   Continuous Infusions: . cefTRIAXone (ROCEPHIN)  IV       LOS: 2 days    Time spent: 30 min    Desma Maxim, MD [facility]   If 7PM-7AM, please contact night-coverage [email or web address] Password [facility] 09/10/2020, 9:47 AM

## 2020-09-10 NOTE — Progress Notes (Signed)
Hypoglycemic Event  CBG: 68 @ 0724  Treatment: 4oz orange juice and peanut butter crackers @ 0740  Symptoms: None  Follow-up CBG: Time: 8295 CBG Result:72  Possible Reasons for Event: Unknown  Comments/MD notified:Second 4oz orange juice provided and patient will have breakfast soon.    Algis Greenhouse

## 2020-09-10 NOTE — Progress Notes (Signed)
Initial Nutrition Assessment  DOCUMENTATION CODES:   Severe malnutrition in context of chronic illness  INTERVENTION:   Ensure Enlive po TID, each supplement provides 350 kcal and 20 grams of protein  Magic cup TID with meals, each supplement provides 290 kcal and 9 grams of protein  MVI po daily   Liberalize diet   Pt at high refeed risk; recommend monitor potassium, magnesium and phosphorus labs daily until stable  NUTRITION DIAGNOSIS:   Severe Malnutrition related to cancer and cancer related treatments as evidenced by severe fat depletion,severe muscle depletion.  GOAL:   Patient will meet greater than or equal to 90% of their needs  MONITOR:   PO intake,Supplement acceptance,Weight trends,Skin,I & O's,Labs  REASON FOR ASSESSMENT:   Malnutrition Screening Tool    ASSESSMENT:   75 y.o. male with a known history of Parkinson's, Stage IV lung cancer, depression/PTSD and COVID 19 ~3 weeks ago who presents to the emergency department for evaluation of syncope.  Met with pt in room today. Pt reports intermittent poor appetite and oral intake pta r/t his cancer treatments but reports that his oral intake has been poor for the past 3 weeks r/t COVID 19. Pt reports that he has been drinking 3-4 Ensure per day at home but reports that sometimes when he has a lot of mucous in his throat, he has trouble getting the Ensure down (prefers strawberry and chocolate). Pt reports that he ate fairly well at breakfast this morning. Pt reports his UBW used to be ~240lbs. Pt reports that he intentionally lost down to 200lbs but then he started losing weight unintentionally down to ~155lbs r/t his cancer treatments. Per chart, pt is down 12lbs(7%) from his UBW; pt reports that weight loss has occurred over the past 3 weeks; this would be significant weight loss. RD will add supplements and liberalize pt's diet to help him meet his estimated needs. RD will also liberalize pt's diet. Pt reports that  he loves sherbet ice cream; will add Magic Cups to meal trays. Pt is likely at refeed risk.   Medications reviewed and include: risaquad, keflex, lovenox, imodium, melatonin, MVI, KCl  Labs reviewed: K 4.1 wnl, P 2.9 wnl, Mg 2.0 wnl cbgs- 73, 68, 72, 107 x 24 hrs  NUTRITION - FOCUSED PHYSICAL EXAM:  Flowsheet Row Most Recent Value  Orbital Region Moderate depletion  Upper Arm Region Severe depletion  Thoracic and Lumbar Region Severe depletion  Buccal Region Moderate depletion  Temple Region Severe depletion  Clavicle Bone Region Severe depletion  Clavicle and Acromion Bone Region Severe depletion  Scapular Bone Region Severe depletion  Dorsal Hand Severe depletion  Patellar Region Severe depletion  Anterior Thigh Region Severe depletion  Posterior Calf Region Severe depletion  Edema (RD Assessment) None  Hair Reviewed  Eyes Reviewed  Mouth Reviewed  Skin Reviewed  Nails Reviewed     Diet Order:   Diet Order            Diet regular Room service appropriate? Yes; Fluid consistency: Thin  Diet effective now                EDUCATION NEEDS:   Education needs have been addressed  Skin:  Skin Assessment: Reviewed RN Assessment (incision R pretibial)  Last BM:  5/2- type 6  Height:   Ht Readings from Last 1 Encounters:  09/08/20 $RemoveB'6\' 2"'[id number]$  (1.88 m)    Weight:   Wt Readings from Last 1 Encounters:  09/10/20 71.3 kg  Ideal Body Weight:  86.3 kg  BMI:  Body mass index is 20.18 kg/m.  Estimated Nutritional Needs:   Kcal:  2100-2400kcal/day  Protein:  110-125g/day  Fluid:  1.8-2.1L/day  Koleen Distance MS, RD, LDN Please refer to [facility] for RD and/or RD on-call/weekend/after hours pager

## 2020-09-10 NOTE — Progress Notes (Addendum)
NAME:  Darren Thomas, Darren Thomas MRN:  [id number], DOB:  02-02-1946, LOS: 2 ADMISSION DATE:  09/07/2020, CONSULTATION DATE: 09/08/2020 REFERRING MD: Dr. Ara Kussmaul, CHIEF COMPLAINT: Syncope  History of Present Illness:  75 year old male presenting to [facility] ED from home via EMS due to syncopal episode while sitting in recliner at home.  Patient reports feeling " real hot & then just went out of it" similar to a flushing feeling that started in his belly and crept up towards his throat.  Patient had last dose of immunotherapy with Keytruda 2 to 3 weeks ago and since that time the patient has had diarrhea and has been nauseated with frequent vomiting episodes.  Because of this the wife reports he has lost a lot of weight and is not eating or drinking very much. Wife called EMS when she noted the patient lost consciousness in his recliner.  ED course: Patient arrived to the ED alert, awake and talking.  Patient noted to have a run of approximately 20+ beats of ventricular tachycardia up to 235 bpm which was self sustained.  Patient was given an amiodarone bolus followed by amiodarone infusion.  He then had 2 additional runs of ventricular tachycardia at which point he reported being lightheaded but did not lose consciousness.  Per ED nursing patient has had multiple runs of self sustained ventricular tachycardia with some syncope. ER Initial vitals: Afebrile at 97.3, RR 11, HR 69, BP 118/71 (83) and SPO2 100% on room air. ER Significant Initial labs: Hypokalemia at 2.6, mag 1.9, albumin 3.3, UA: + Ketones, + leukocytes, + nitrites w/ many bacteria and > 50 WBCs.  COVID PCR positive  PCCM consulted due to ongoing runs of ventricular tachycardia. Pertinent  Medical History  Stage IV lung cancer Parkinson's COVID-19 (2 to 3 weeks ago) Depression  Significant Hospital Events: Including procedures, antibiotic start and stop dates in addition to other pertinent events   . 09/08/2020-admitted due to self sustaining  symptomatic ventricular tachycardia with syncope on amiodarone drip. Due to sustained ventricular tachycardia pt required cardioversion and due to concern of prolonged Qtc amiodarone gtt discontinued and lidocaine gtt initiated . 09/09/2020-Lidocaine gtt dose decreased to 1mg /min  . 09/10/2020-Pt developed sinus bradycardia hr 50's  Interim History / Subjective:  Pt resting in bed only complaint right lateral foot pain.  No acute distress eating breakfast   Objective   Blood pressure 98/71, pulse (!) 105, temperature (!) 97 F (36.1 C), temperature source Oral, resp. rate 13, height 6\' 2"  (1.88 m), weight 71.3 kg, SpO2 93 %.        Intake/Output Summary (Last 24 hours) at 09/10/2020 0623 Last data filed at 09/10/2020 0400 Gross per 24 hour  Intake 1489.99 ml  Output 2450 ml  Net -960.01 ml   Filed Weights   09/08/20 1005 09/09/20 0500 09/10/20 0500  Weight: 71 kg 71.3 kg 71.3 kg    Examination: General: Frail cachetic male, resting in bed, NAD HEENT: MM pale/dry, anicteric, atraumatic, neck supple Neuro: Alert and oriented, follows commands, PERRLA  CV: Sinus bradycardia, s1s2, no M/R/G, 2+ radial/1+ distal pulses, trace bilateral lower extremity edema  Pulm: diminished throughout, even, non labored GI: +BS x4, soft, non tender, non distended Skin: Limited exam- no rashes/lesions noted Extremities: Warm/dry, moves all extremities, right lateral foot pain   Labs/imaging that I have personally reviewed  (right click and "Reselect all SmartList Selections" daily)  09/10/2020: All labs reviewed   Resolved Hospital Problem list   Ventricular Tachycardia  Assessment & Plan:   Syncope with non-sustained VTACH~Resolved Sinus bradycardia~05/2 -Continuous telemetry monitoring  -Cardiology consulted appreciate input~cardiac medications per recommendations attempting to wean off lidocaine gtt 05/2  -Replete electrolytes as indicated   Lung cancer -stage 4  - continue supportive care     - previously on Day infection~asymptomatic pt has received COVID-vaccinations -Prn supplemental O2 for dyspnea and/or hypoxia  -Trend WBC and monitor fever curve   UTI Will will give additional 2 doses of ceftriaxone   Diarrhea - presumably due to COVID -Encourage po fluid intake to avoid dehydration  -Scheduled and prn imodium   Parkinson's  Disease  - outpatient medications continued   Best practice (right click and "Reselect all SmartList Selections" daily)  Diet:  Heart Healthy  Pain/Anxiety/Delirium protocol (if indicated): No VAP protocol (if indicated): Not indicated DVT prophylaxis: LMWH GI prophylaxis: N/A*Per primary Glucose control:  SSI No Central venous access:  N/A Arterial line:  N/A Foley:  N/A Mobility:  PT consulted  PT consulted: PT consult pending  Last date of multidisciplinary goals of care discussion 09/10/2020 Code Status:  DNR Disposition: Stepdown  Labs   CBC: Recent Labs  Lab 09/07/20 2255 09/08/20 0533 09/09/20 0412 09/10/20 0448  WBC 9.4 8.7 6.8 7.4  HGB 13.4 11.7* 11.5* 12.2*  HCT 37.7* 33.3* 33.1* 35.3*  MCV 82.1 83.7 85.5 86.5  PLT 284 217 187 275    Basic Metabolic Panel: Recent Labs  Lab 09/07/20 2255 09/08/20 0533 09/08/20 0800 09/08/20 2004 09/09/20 0134 09/09/20 0412 09/09/20 0747 09/09/20 1715 09/10/20 0448  NA 137 137   < > 136 138  --  136 136 138  K 2.6* 2.6*   < > 3.4* 4.1  --  3.9 4.0 4.1  CL 98 99   < > 104 108  --  105 102 106  CO2 29 27   < > 24 25  --  24 27 27   GLUCOSE 112* 83   < > 86 79  --  104* 98 81  BUN 19 15   < > 11 9  --  7* 8 9  CREATININE 0.84 0.98  0.96   < > 0.74 0.78  --  0.63 0.88 0.93  CALCIUM 8.4* 7.6*   < > 7.8* 7.8*  --  7.8* 8.0* 8.0*  MG 1.9 2.3  --   --   --  1.9  --   --  2.0  PHOS  --  3.2  --   --   --  2.1*  --   --  2.9   < > = values in this interval not displayed.   GFR: Estimated Creatinine Clearance: 67.1 mL/min (by C-G formula based on SCr of 0.93  mg/dL). Recent Labs  Lab 09/07/20 2255 09/08/20 0533 09/09/20 0412 09/10/20 0448  WBC 9.4 8.7 6.8 7.4    Liver Function Tests: Recent Labs  Lab 09/07/20 2255  AST 21  ALT 9  ALKPHOS 60  BILITOT 1.0  PROT 6.0*  ALBUMIN 3.3*   No results for input(s): LIPASE, AMYLASE in the last 168 hours. No results for input(s): AMMONIA in the last 168 hours.  ABG No results found for: PHART, PCO2ART, PO2ART, HCO3, TCO2, ACIDBASEDEF, O2SAT   Coagulation Profile: No results for input(s): INR, PROTIME in the last 168 hours.  Cardiac Enzymes: Recent Labs  Lab 09/07/20 2255  CKTOTAL 166    HbA1C: Hgb A1c MFr Bld  Date/Time Value Ref Range Status  05/15/2017 12:19  PM 4.8 <5.7 % of total Hgb Final    Comment:    For the purpose of screening for the presence of diabetes: . <5.7%       Consistent with the absence of diabetes 5.7-6.4%    Consistent with increased risk for diabetes             (prediabetes) > or =6.5%  Consistent with diabetes . This assay result is consistent with a decreased risk of diabetes. . Currently, no consensus exists regarding use of hemoglobin A1c for diagnosis of diabetes in children. . According to American Diabetes Association (ADA) guidelines, hemoglobin A1c <7.0% represents optimal control in non-pregnant diabetic patients. Different metrics may apply to specific patient populations.  Standards of Medical Care in Diabetes(ADA). .     CBG: Recent Labs  Lab 09/09/20 1917 09/09/20 2315 09/10/20 0503 09/10/20 0724 09/10/20 0757  GLUCAP 96 95 73 68* 72    Review of Systems: Positives in bold  Gen: Denies fever, chills, weight change, fatigue, night sweats HEENT: Denies blurred vision, double vision, hearing loss, tinnitus, sinus congestion, rhinorrhea, sore throat, neck stiffness, dysphagia PULM: Denies shortness of breath, cough, sputum production, hemoptysis, wheezing CV: Denies chest pain, edema, orthopnea, paroxysmal nocturnal  dyspnea, palpitations GI: Denies abdominal pain, nausea, vomiting, diarrhea, hematochezia, melena, constipation, change in bowel habits GU: Denies dysuria, hematuria, polyuria, oliguria, urethral discharge Endocrine: Denies hot or cold intolerance, polyuria, polyphagia or appetite change Derm: Denies rash, dry skin, scaling or peeling skin change Heme: Denies easy bruising, bleeding, bleeding gums Neuro: Denies headache, numbness, weakness, slurred speech, loss of consciousness  Past Medical History:  He,  has a past medical history of Abnormality of gait, Back pain, Cancer ([place]) (04/2019), Cervical spondylosis with myelopathy (01/06/2014), Depression, Insomnia, Parkinson disease ([facility]) (04/2018), PTSD (post-traumatic stress disorder), and PTSD (post-traumatic stress disorder).   Surgical History:   Past Surgical History:  Procedure Laterality Date  . C-spine     C-3-7 removed  . SCALP LESION REMOVAL W/ FLAP AND SKIN GRAFT    . SKIN CANCER EXCISION Left    squamos cell cancinoma     Social History:   reports that he quit smoking about 49 years ago. His smoking use included cigarettes. He has a 20.00 pack-year smoking history. He has never used smokeless tobacco. He reports that he does not drink alcohol and does not use drugs.   Family History:  His family history includes Asthma in his brother; Cervical cancer in his mother; Colon cancer (age of onset: 66) in his son; Diabetes in his father; Diabetes type I in his son; Emphysema in his brother; Heart Problems in his father.   Allergies Allergies  Allergen Reactions  . Augmentin [Amoxicillin-Pot Clavulanate] Diarrhea  . Duloxetine     Other reaction(s): Chest pain     Home Medications  Prior to Admission medications   Medication Sig Start Date End Date Taking? Authorizing Provider  buPROPion HCl ER, XL, 450 MG TB24 Take 450 mg by mouth daily. Pt taking 300mg  daily 12/10/17   [provider]  carbidopa-levodopa (SINEMET  IR) 25-100 MG tablet Take 3 tablets by mouth 3 (three) times daily.     [provider]  cyproheptadine (PERIACTIN) 4 MG tablet Take 4 mg by mouth daily.  04/30/19   [provider]  donepezil (ARICEPT) 10 MG tablet Take 10 mg by mouth at bedtime.    [provider]  escitalopram (LEXAPRO) 20 MG tablet Take 20 mg by mouth daily. 10/17/16  [provider]  fluticasone (FLONASE) 50 MCG/ACT nasal spray USE 2 SPRAYS IN EACH NOSTRIL EVERY DAY 11/12/19   Steele Sizer, MD  lamoTRIgine (LAMICTAL) 100 MG tablet 1 tablet daily. 02/14/18   [provider]  lidocaine (XYLOCAINE) 2 % solution Use as directed 15 mLs in the mouth or throat as needed for mouth pain. 06/15/19   Rudene Re, MD  Multiple Vitamins-Minerals (COMPLETE MULTIVITAMIN/MINERAL PO) Take 1 tablet by mouth daily. 12/20/13   [provider]  Multiple Vitamins-Minerals (PRESERVISION AREDS 2 PO) Take by mouth.    [provider]  pembrolizumab (KEYTRUDA) 100 MG/4ML SOLN Inject 2 mg/kg into the vein. Immunotherapy treatments every 6 weeks at [facility], Historical, MD  polyethylene glycol (MIRALAX / GLYCOLAX) packet Take 17 g by mouth every other day.    [provider]  traZODone (DESYREL) 100 MG tablet Take 1 tablet by mouth at bedtime. 02/03/19   [provider]     ~PCCM team will sign off at this time.  Thank you for the consultation if you need additional assistance please call the number listed in [place].  Marda Stalker, [place] Pager [phone] (please enter 7 digits) PCCM Consult Pager [phone] (please enter 7 digits)

## 2020-09-10 NOTE — Consult Note (Signed)
[facility] for Electrolyte Monitoring and Replacement   Recent Labs: Potassium (mmol/L)  Date Value  09/10/2020 4.1   Magnesium (mg/dL)  Date Value  09/10/2020 2.0   Calcium (mg/dL)  Date Value  09/10/2020 8.0 (L)   Albumin (g/dL)  Date Value  09/07/2020 3.3 (L)   Phosphorus (mg/dL)  Date Value  09/10/2020 2.9   Sodium (mmol/L)  Date Value  09/10/2020 138  05/14/2016 141   Assessment: Patient is a 75 y/o M with medical history including Parkinson's disease, lung cancer, depression / PTSD, recent SARS-CoV-2 infection who presented to the ED for evaluation of syncope. Noted to have multiple episodes of NSVT in the ED. QTc prolonged (amio stopped) . Worsened on amiodarone and was changed to lidocaine drip. Pharmacy consulted to assist with electrolyte monitoring and replacement as indicated.  Patient also with NVD. Started on loperamide. KCl 57mEq BID Pt being weaned off of lidocaine drip   Goal of Therapy:  K > 4 Mg > 2 All other electrolytes within normal limits  Plan:  -- Electrolytes WNL, no replacement needed at this time -- Follow up electrolytes with AM labs  Sherilyn Banker, PharmD Pharmacy Resident  09/10/2020 6:30 AM

## 2020-09-10 NOTE — Progress Notes (Signed)
Progress Note  Patient Name: Darren Thomas. Date of Encounter: 09/10/2020  [facility] Cardiologist: Agbor-Etang  Subjective   Patient feels well, denying chest pain, shortness of breath, palpitations, and lightheadedness.  Inpatient Medications    Scheduled Meds: . acidophilus  2 capsule Oral TID  . buPROPion  300 mg Oral Daily  . carbidopa-levodopa  3 tablet Oral TID  . [START ON 09/11/2020] cephALEXin  250 mg Oral Q6H  . Chlorhexidine Gluconate Cloth  6 each Topical Q0600  . cyproheptadine  4 mg Oral Daily  . enoxaparin (LOVENOX) injection  40 mg Subcutaneous Q24H  . feeding supplement  237 mL Oral TID BM  . fluticasone  2 spray Each Nare Daily  . loperamide  4 mg Oral BID  . melatonin  5 mg Oral QHS  . metoprolol succinate  12.5 mg Oral Daily  . [START ON 09/11/2020] multivitamin with minerals  1 tablet Oral Daily  . potassium chloride  40 mEq Oral Daily  . sodium chloride flush  3 mL Intravenous Q12H   Continuous Infusions:  PRN Meds: acetaminophen **OR** acetaminophen, alum & mag hydroxide-simeth, bisacodyl, loperamide   Vital Signs    Vitals:   09/10/20 1500 09/10/20 1600 09/10/20 1700 09/10/20 1800  BP: (!) 103/55 (!) 108/54 113/61 (!) 104/52  Pulse: (!) 44 (!) 54 77 (!) 154  Resp: 16 19 16 18   Temp:  (!) 97.2 F (36.2 C)    TempSrc:  Oral    SpO2: 99% 98% 99% 99%  Weight:      Height:        Intake/Output Summary (Last 24 hours) at 09/10/2020 1900 Last data filed at 09/10/2020 1600 Gross per 24 hour  Intake 798.16 ml  Output 2050 ml  Net -1251.84 ml   Last 3 Weights 09/10/2020 09/09/2020 09/08/2020  Weight (lbs) 157 lb 3 oz 157 lb 3 oz 156 lb 8.4 oz  Weight (kg) 71.3 kg 71.3 kg 71 kg      Telemetry    Sinus bradycardia- Personally Reviewed  ECG    No new tracing.  Physical Exam   GEN: No acute distress.   Neck: No JVD Cardiac: RRR, no murmurs, rubs, or gallops.  Respiratory: Clear to auscultation bilaterally. GI: Soft, nontender,  non-distended  MS: No edema; No deformity. Neuro:  Nonfocal  Psych: Normal affect   Labs    High Sensitivity Troponin:   Recent Labs  Lab 09/07/20 2255 09/08/20 0533 09/08/20 0800  TROPONINIHS 7 9 8       Chemistry Recent Labs  Lab 09/07/20 2255 09/08/20 0533 09/09/20 0747 09/09/20 1715 09/10/20 0448  NA 137   < > 136 136 138  K 2.6*   < > 3.9 4.0 4.1  CL 98   < > 105 102 106  CO2 29   < > 24 27 27   GLUCOSE 112*   < > 104* 98 81  BUN 19   < > 7* 8 9  CREATININE 0.84   < > 0.63 0.88 0.93  CALCIUM 8.4*   < > 7.8* 8.0* 8.0*  PROT 6.0*  --   --   --   --   ALBUMIN 3.3*  --   --   --   --   AST 21  --   --   --   --   ALT 9  --   --   --   --   ALKPHOS 60  --   --   --   --  BILITOT 1.0  --   --   --   --   GFRNONAA >60   < > >60 >60 >60  ANIONGAP 10   < > 7 7 5    < > = values in this interval not displayed.     Hematology Recent Labs  Lab 09/08/20 0533 09/09/20 0412 09/10/20 0448  WBC 8.7 6.8 7.4  RBC 3.98* 3.87* 4.08*  HGB 11.7* 11.5* 12.2*  HCT 33.3* 33.1* 35.3*  MCV 83.7 85.5 86.5  MCH 29.4 29.7 29.9  MCHC 35.1 34.7 34.6  RDW 14.1 14.6 15.1  PLT 217 187 184    BNPNo results for input(s): BNP, PROBNP in the last 168 hours.   DDimer No results for input(s): DDIMER in the last 168 hours.   Radiology    ECHOCARDIOGRAM COMPLETE  Result Date: 09/09/2020    ECHOCARDIOGRAM REPORT   Patient Name:   Darren Thomas. Date of Exam: 09/09/2020 Medical Rec #:  [id number]         Height:       74.0 in Accession #:    [id number]        Weight:       157.2 lb Date of Birth:  1945/05/17         BSA:          1.962 m Patient Age:    75 years          BP: Patient Gender: M                 HR: Exam Location: Indications:     R55 Syncope; R00.0 Tachycardia  Referring Phys:  [id number] ALEXIS HUGELMEYER Diagnosing Phys: Kate Sable MD IMPRESSIONS  1. Left ventricular ejection fraction, by estimation, is 45 to 50%. The left ventricle has mildly decreased function. The left  ventricle has no regional wall motion abnormalities. Left ventricular diastolic parameters were normal.  2. Right ventricular systolic function is normal. The right ventricular size is normal. There is normal pulmonary artery systolic pressure.  3. The mitral valve is grossly normal. Trivial mitral valve regurgitation.  4. The aortic valve is grossly normal. Aortic valve regurgitation is not visualized. Mild aortic valve sclerosis is present, with no evidence of aortic valve stenosis.  5. The inferior vena cava is normal in size with greater than 50% respiratory variability, suggesting right atrial pressure of 3 mmHg. FINDINGS  Left Ventricle: Left ventricular ejection fraction, by estimation, is 45 to 50%. The left ventricle has mildly decreased function. The left ventricle has no regional wall motion abnormalities. The left ventricular internal cavity size was normal in size. There is no left ventricular hypertrophy. Left ventricular diastolic parameters were normal. Right Ventricle: The right ventricular size is normal. No increase in right ventricular wall thickness. Right ventricular systolic function is normal. There is normal pulmonary artery systolic pressure. The tricuspid regurgitant velocity is 2.32 m/s, and  with an assumed right atrial pressure of 3 mmHg, the estimated right ventricular systolic pressure is 88.4 mmHg. Left Atrium: Left atrial size was normal in size. Right Atrium: Right atrial size was normal in size. Pericardium: There is no evidence of pericardial effusion. Mitral Valve: The mitral valve is grossly normal. Trivial mitral valve regurgitation. Tricuspid Valve: The tricuspid valve is grossly normal. Tricuspid valve regurgitation is trivial. Aortic Valve: The aortic valve is grossly normal. Aortic valve regurgitation is not visualized. Mild aortic valve sclerosis is present, with no evidence of aortic valve stenosis. Aortic valve mean gradient  measures 6.0 mmHg. Aortic valve peak gradient  measures 10.0 mmHg. Aortic valve area, by VTI measures 2.15 cm. Pulmonic Valve: The pulmonic valve was not well visualized. Pulmonic valve regurgitation is not visualized. Aorta: The aortic root is normal in size and structure. Venous: The inferior vena cava is normal in size with greater than 50% respiratory variability, suggesting right atrial pressure of 3 mmHg. IAS/Shunts: No atrial level shunt detected by color flow Doppler.  LEFT VENTRICLE PLAX 2D LVIDd:         4.27 cm  Diastology LVIDs:         3.64 cm  LV e' medial:    6.64 cm/s LV PW:         0.91 cm  LV E/e' medial:  15.8 LV IVS:        1.03 cm  LV e' lateral:   10.20 cm/s LVOT diam:     2.00 cm  LV E/e' lateral: 10.3 LV SV:         78 LV SV Index:   40 LVOT Area:     3.14 cm  RIGHT VENTRICLE RV S prime:     12.20 cm/s TAPSE (M-mode): 2.1 cm LEFT ATRIUM             Index LA diam:        3.00 cm 1.53 cm/m LA Vol (A2C):   61.7 ml 31.45 ml/m LA Vol (A4C):   36.4 ml 18.55 ml/m LA Biplane Vol: 47.3 ml 24.11 ml/m  AORTIC VALVE                    PULMONIC VALVE AV Area (Vmax):    2.33 cm     PV Vmax:       0.86 m/s AV Area (Vmean):   1.87 cm     PV Peak grad:  3.0 mmHg AV Area (VTI):     2.15 cm AV Vmax:           158.00 cm/s AV Vmean:          121.000 cm/s AV VTI:            0.362 m AV Peak Grad:      10.0 mmHg AV Mean Grad:      6.0 mmHg LVOT Vmax:         117.00 cm/s LVOT Vmean:        71.900 cm/s LVOT VTI:          0.248 m LVOT/AV VTI ratio: 0.69  AORTA Ao Root diam: 3.40 cm MITRAL VALVE                TRICUSPID VALVE MV Area (PHT): 2.53 cm     TR Peak grad:   21.5 mmHg MV E velocity: 105.00 cm/s  TR Vmax:        232.00 cm/s MV A velocity: 73.70 cm/s MV E/A ratio:  1.42         SHUNTS                             Systemic VTI:  0.25 m                             Systemic Diam: 2.00 cm Kate Sable MD Electronically signed by Kate Sable MD Signature Date/Time: 09/09/2020/3:11:05 PM    Final     Cardiac Studies  TTE (09/09/2020): 1.  Left ventricular ejection fraction, by estimation, is 45 to 50%. The  left ventricle has mildly decreased function. The left ventricle has no  regional wall motion abnormalities. Left ventricular diastolic parameters  were normal.  2. Right ventricular systolic function is normal. The right ventricular  size is normal. There is normal pulmonary artery systolic pressure.  3. The mitral valve is grossly normal. Trivial mitral valve  regurgitation.  4. The aortic valve is grossly normal. Aortic valve regurgitation is not  visualized. Mild aortic valve sclerosis is present, with no evidence of  aortic valve stenosis.  5. The inferior vena cava is normal in size with greater than 50%  respiratory variability, suggesting right atrial pressure of 3 mmHg.  Patient Profile     75 y.o. male with history of stage IV lung cancer and Parkinson's disease, admitted with syncope and found to have sustained ventricular tachycardia.  Assessment & Plan    Ventricular tachycardia: Telemetry during admission reviewed.  He had an episode of sustained ventricular tachycardia on 09/08/2020 that lasted approximately 50 seconds.  It was preceded by a shorter episode of NSVT.  Sustained VT appears polymorphic.  Subsequent EKGs show significant QT prolongation that was thought to have been exacerbated by administration of amiodarone.  Patient's VT has been quiescent on lidocaine since then.  He was also started on low-dose metoprolol yesterday.  Discontinue lidocaine infusion and continue monitoring in stepdown.  In the setting of significant bradycardia and what appears to be torsades to [facility] that can be precipitated by R-on-T phenomenon, I would favor discontinuation of beta-blocker at this time.  Maintain potassium and magnesium levels greater than 4.0 and 2.0, respectively.  Repeat EKG in the morning to assess QT interval.  Plan for left heart catheterization with possible PCI tomorrow to exclude  underlying ischemic substrate in the setting of sustained VT and reduced LVEF.  Patient has agreed to rescind his DNR for the procedure.  EP consultation on Wednesday.  Cardiomyopathy: LVEF mildly reduced as noted above.  Plan for left heart catheterization with possible PCI tomorrow.  Hold metoprolol succinate in the setting of significant bradycardia.  Defer adding ACE inhibitor/ARB in the setting of intermittent hypotension.  COVID-19: Incidentally discovered.  Patient is asymptomatic other than nausea and diarrhea that likely led to marked electrolyte derangements.  Per internal medicine.  Lung cancer: Patient reports good response to [facility] though details regarding his disease status and prognosis is uncertain.  I have touched base with Dr. Birdena Crandall regarding reaching out to the [place] for further information.  This will be particularly important when determining what therapies Mr. Duque may be a candidate for in regard to his VT.  Shared Decision Making/Informed Consent The risks [stroke (1 in 1000), death (1 in 1000), kidney failure [usually temporary] (1 in 500), bleeding (1 in 200), allergic reaction [possibly serious] (1 in 200)], benefits (diagnostic support and management of coronary artery disease) and alternatives of a cardiac catheterization were discussed in detail with Mr. Sheckler and he is willing to proceed.     For questions or updates, please contact [place] Please consult [email or web address] for contact info under [facility] Cardiology.      Signed, Nelva Bush, MD  09/10/2020, 7:00 PM

## 2020-09-11 ENCOUNTER — Encounter
Admission: EM | Disposition: A | Discharge status: Home Health | Payer: Self-pay | Source: Home / Self Care | Attending: Obstetrics and Gynecology

## 2020-09-11 DIAGNOSIS — I2510 Atherosclerotic heart disease of native coronary artery without angina pectoris: Secondary | ICD-10-CM

## 2020-09-11 DIAGNOSIS — I428 Other cardiomyopathies: Secondary | ICD-10-CM

## 2020-09-11 HISTORY — PX: LEFT HEART CATH AND CORONARY ANGIOGRAPHY: CATH118249

## 2020-09-11 LAB — MAGNESIUM: Magnesium: 1.9 mg/dL (ref 1.7–2.4)

## 2020-09-11 LAB — PHOSPHORUS: Phosphorus: 3.6 mg/dL (ref 2.5–4.6)

## 2020-09-11 LAB — BASIC METABOLIC PANEL
Anion gap: 6 (ref 5–15)
BUN: 16 mg/dL (ref 8–23)
CO2: 28 mmol/L (ref 22–32)
Calcium: 8.1 mg/dL — ABNORMAL LOW (ref 8.9–10.3)
Chloride: 103 mmol/L (ref 98–111)
Creatinine, Ser: 0.79 mg/dL (ref 0.61–1.24)
GFR, Estimated: >60 mL/min (ref >60)
Glucose, Bld: 92 mg/dL (ref 70–99)
Potassium: 4.2 mmol/L (ref 3.5–5.1)
Sodium: 137 mmol/L (ref 135–145)

## 2020-09-11 LAB — GLUCOSE, CAPILLARY
Glucose-Capillary: 106 mg/dL — ABNORMAL HIGH (ref 70–99)
Glucose-Capillary: 116 mg/dL — ABNORMAL HIGH (ref 70–99)
Glucose-Capillary: 52 mg/dL — ABNORMAL LOW (ref 70–99)
Glucose-Capillary: 54 mg/dL — ABNORMAL LOW (ref 70–99)
Glucose-Capillary: 74 mg/dL (ref 70–99)
Glucose-Capillary: 76 mg/dL (ref 70–99)
Glucose-Capillary: 84 mg/dL (ref 70–99)
Glucose-Capillary: 91 mg/dL (ref 70–99)

## 2020-09-11 SURGERY — LEFT HEART CATH AND CORONARY ANGIOGRAPHY
Anesthesia: Moderate Sedation

## 2020-09-11 MED ORDER — IOHEXOL 300 MG/ML  SOLN
INTRAMUSCULAR | Status: DC | PRN
  Administered 2020-09-11: 29 mL

## 2020-09-11 MED ORDER — SODIUM CHLORIDE 0.9% FLUSH: 3.0000 mL | INTRAVENOUS | Status: DC | PRN

## 2020-09-11 MED ORDER — ASPIRIN 81 MG PO CHEW: 81.0000 mg | CHEWABLE_TABLET | ORAL | Status: DC

## 2020-09-11 MED ORDER — HEPARIN (PORCINE) IN NACL 1000-0.9 UT/500ML-% IV SOLN
INTRAVENOUS | Status: DC | PRN
  Administered 2020-09-11: 1000 mL

## 2020-09-11 MED ORDER — SODIUM CHLORIDE 0.9 % IV SOLN: INTRAVENOUS | Status: AC

## 2020-09-11 MED ORDER — SODIUM CHLORIDE 0.9% FLUSH
3.0000 mL | Freq: Two times a day (BID) | INTRAVENOUS | Status: DC
  Administered 2020-09-11 – 2020-09-13 (×4): 3 mL via INTRAVENOUS

## 2020-09-11 MED ORDER — FENTANYL CITRATE (PF) 100 MCG/2ML IJ SOLN
INTRAMUSCULAR | Status: AC
  Filled 2020-09-11: qty 2

## 2020-09-11 MED ORDER — ASPIRIN 81 MG PO CHEW
81.0000 mg | CHEWABLE_TABLET | Freq: Every day | ORAL | Status: DC
  Administered 2020-09-12 – 2020-09-13 (×2): 81 mg via ORAL
  Filled 2020-09-11 (×2): qty 1

## 2020-09-11 MED ORDER — SODIUM CHLORIDE 0.9 % IV BOLUS
INTRAVENOUS | Status: AC | PRN
  Administered 2020-09-11: 250 mL via INTRAVENOUS

## 2020-09-11 MED ORDER — LABETALOL HCL 5 MG/ML IV SOLN: 10.0000 mg | INTRAVENOUS | Status: AC | PRN

## 2020-09-11 MED ORDER — SODIUM CHLORIDE 0.9 % IV SOLN: INTRAVENOUS | Status: DC

## 2020-09-11 MED ORDER — DEXTROSE 50 % IV SOLN
25.0000 g | INTRAVENOUS | Status: AC
  Administered 2020-09-11: 25 g via INTRAVENOUS

## 2020-09-11 MED ORDER — LIDOCAINE HCL (PF) 1 % IJ SOLN
INTRAMUSCULAR | Status: DC | PRN
  Administered 2020-09-11: 2 mL

## 2020-09-11 MED ORDER — MIDAZOLAM HCL 2 MG/2ML IJ SOLN
INTRAMUSCULAR | Status: AC
  Filled 2020-09-11: qty 2

## 2020-09-11 MED ORDER — DEXTROSE 50 % IV SOLN
INTRAVENOUS | Status: AC
  Filled 2020-09-11: qty 50

## 2020-09-11 MED ORDER — FENTANYL CITRATE (PF) 100 MCG/2ML IJ SOLN
INTRAMUSCULAR | Status: DC | PRN
  Administered 2020-09-11: 12.5 ug via INTRAVENOUS

## 2020-09-11 MED ORDER — HYDRALAZINE HCL 20 MG/ML IJ SOLN: 10.0000 mg | INTRAMUSCULAR | Status: AC | PRN

## 2020-09-11 MED ORDER — MIDAZOLAM HCL 2 MG/2ML IJ SOLN
INTRAMUSCULAR | Status: DC | PRN
  Administered 2020-09-11: 0.5 mg via INTRAVENOUS

## 2020-09-11 MED ORDER — SODIUM CHLORIDE 0.9 % IV SOLN: 250.0000 mL | INTRAVENOUS | Status: DC | PRN

## 2020-09-11 MED ORDER — LIDOCAINE HCL (PF) 1 % IJ SOLN
INTRAMUSCULAR | Status: AC
  Filled 2020-09-11: qty 30

## 2020-09-11 MED ORDER — MAGNESIUM SULFATE 2 GM/50ML IV SOLN
2.0000 g | Freq: Once | INTRAVENOUS | Status: AC
  Administered 2020-09-11: 2 g via INTRAVENOUS
  Filled 2020-09-11: qty 50

## 2020-09-11 MED ORDER — ENOXAPARIN SODIUM 40 MG/0.4ML IJ SOSY
40.0000 mg | PREFILLED_SYRINGE | INTRAMUSCULAR | Status: DC
  Administered 2020-09-12 – 2020-09-13 (×2): 40 mg via SUBCUTANEOUS
  Filled 2020-09-11: qty 0.4

## 2020-09-11 MED ORDER — HEPARIN (PORCINE) IN NACL 1000-0.9 UT/500ML-% IV SOLN
INTRAVENOUS | Status: AC
  Filled 2020-09-11: qty 1000

## 2020-09-11 MED ORDER — VERAPAMIL HCL 2.5 MG/ML IV SOLN
INTRAVENOUS | Status: AC
  Filled 2020-09-11: qty 2

## 2020-09-11 MED ORDER — VERAPAMIL HCL 2.5 MG/ML IV SOLN
INTRAVENOUS | Status: DC | PRN
  Administered 2020-09-11: 2.5 mg via INTRA_ARTERIAL

## 2020-09-11 MED ORDER — HEPARIN SODIUM (PORCINE) 1000 UNIT/ML IJ SOLN
INTRAMUSCULAR | Status: DC | PRN
Start: 2020-09-11 — End: 2020-09-11
  Administered 2020-09-11: 3500 [IU] via INTRAVENOUS

## 2020-09-11 MED ORDER — HEPARIN SODIUM (PORCINE) 1000 UNIT/ML IJ SOLN
INTRAMUSCULAR | Status: AC
  Filled 2020-09-11: qty 1

## 2020-09-11 SURGICAL SUPPLY — 11 items

## 2020-09-11 NOTE — Consult Note (Addendum)
[place] for Electrolyte Monitoring and Replacement   Recent Labs: Potassium (mmol/L)  Date Value  09/11/2020 4.2   Magnesium (mg/dL)  Date Value  09/11/2020 1.9   Calcium (mg/dL)  Date Value  09/11/2020 8.1 (L)   Albumin (g/dL)  Date Value  09/07/2020 3.3 (L)   Phosphorus (mg/dL)  Date Value  09/11/2020 3.6   Sodium (mmol/L)  Date Value  09/11/2020 137  05/14/2016 141   Assessment: Patient is a 75 y/o M with medical history including Parkinson's disease, lung cancer, depression / PTSD, recent SARS-CoV-2 infection who presented to the ED for evaluation of syncope. Noted to have multiple episodes of NSVT in the ED. QTc prolonged (amio stopped) . Worsened on amiodarone and was changed to lidocaine drip. Pharmacy consulted to assist with electrolyte monitoring and replacement as indicated.  Patient also with NVD. Started on loperamide. KCl 66mEq PO daily   Goal of Therapy:  K > 4 Mg > 2 All other electrolytes within normal limits  Plan:  -- Mg 1.9 - will replace with 2g IV Mg sulfate  -- Follow up electrolytes with AM labs  Sherilyn Banker, PharmD Pharmacy Resident  09/11/2020 6:28 AM

## 2020-09-11 NOTE — Progress Notes (Signed)
PROGRESS NOTE    Darren Thomas.  QQI:[id number] DOB: 1946-02-10 DOA: 09/07/2020 PCP: Steele Sizer, MD  [facility]    Brief Narrative:   Darren Thomas is a 75 y.o. male with a known history of Parkinson's, lung C, depression/PTSD, COVID 3 weeks ago presents to the emergency department for evaluation of syncope.  Patient was in a usual state of health until recent COVID infection, he has not fully recovered his appetite, and has been having N, V/D  Patient sustained a syncope epiosde at home today while sitting in a chair.  Wife witnessed and denies seizure like activity.   Of note, he has had three episodes on nonsustained VT in the ER associated ith dizziness and lightheadedness, but no additional syncope.  All of his prior care has been at the [place].   Patient denies fevers/chills, weakness, dizziness, chest pain, shortness of breath abdominal pain, dysuria/frequency, changes in mental status.    Otherwise there has been no change in status. Patient has been taking medication as prescribed and there has been no recent change in medication or diet.  No recent antibiotics.  There has been no recent illness, hospitalizations, travel or sick contacts.      Assessment & Plan:   Active Problems:   Ventricular tachycardia (HCC)   Hypokalemia   Syncope   Protein-calorie malnutrition, severe   Cardiomyopathy ([place])   #. Syncope 2/2 VT Severe hypokalemia likely contributory, immunotherapy may also contribute. Worsened w/ amio. Treated then w/ lido gtt, weaned off Ef 45-50 - cardiology following - LHC today. Possible EP consult tomorrow w/ consideration of ICD - treating hypokalemia as below  # Hypokalemia # Hypophosphatemia 2/2 recent weeks of decreased PO, nausea, diarrhea. K 2.6 on presentation. Now resolved w/ aggressive supplementation. Tolerating PO. Mg wnl. Likely 2/2 immunotherapy - monitor - oral kcl 40 qd  # Nausea, vomiting, diarrhea Likely 2/2 lung  cancer and its treatment. Gi pathogen panel neg. . No sig abd pain/tenderness so holding on scan for now. Pt says stool more formed yesterday and today. Stool frequency has decreased considerably, hasn't stooled today - monitor - cont loperamide - consider steroids if worsens  #. UTI, symptomatic Klebsiella, sensitivities have returned - transition to keflex, last day 5/4  # Covid positive Appears asymptomatic, no o2 requirement. Diarrhea predated covid diagnoses (tested positive when wife was diagnosed w/ symptomatic covid, her symptoms began 4/15 and pt was diagnosed 4/17. - monitor - airborne and contact precautions  # prolonged qtc In the 600s on presentation, 400s today - avoid qtc prolongers. Home donepezil and trazodone held. Will also hold home escitalopram  #. History of Parkinsons - Continue Sinemet   #. History of depression/PTSD - Continue Bupropion. Hold escitalopram as above  # Lung cancer Followed at [place], on [place] every 6 wks, last dose early April. I spoke w/ Dr. Kennyth Lose his oncologist, plan for outpt f/u which is scheduled   DVT prophylaxis: lovenox Code Status: dnr Family Communication: wife updated @ bedside 5/3  Level of care: Stepdown Status is: Inpatient  Remains inpatient appropriate because:Inpatient level of care appropriate due to severity of illness   Dispo: The patient is from: Home              Anticipated d/c is to: tbd              Patient currently is not medically stable to d/c.   Difficult to place patient No  Consultants:  Critical care, cardiology  Procedures: none  Antimicrobials:  Ceftriaxone 4/29>    Subjective: This morning feels a bit better. Tolerated breakfast no voming. No stool this am. No chest pain or palpitations or dyspena.   Objective: Vitals:   09/11/20 0600 09/11/20 0700 09/11/20 0800 09/11/20 0900  BP: (!) 102/51 118/62 (!) 109/58 100/62  Pulse: (!) 47 (!) 52 (!) 54 73  Resp: 17 13 12 17    Temp:   97.6 F (36.4 C)   TempSrc:   Oral   SpO2: 98% 100% 100% 97%  Weight:      Height:        Intake/Output Summary (Last 24 hours) at 09/11/2020 1249 Last data filed at 09/11/2020 0500 Gross per 24 hour  Intake 300 ml  Output 1700 ml  Net -1400 ml   Filed Weights   09/09/20 0500 09/10/20 0500 09/11/20 0500  Weight: 71.3 kg 71.3 kg 70.3 kg    Examination:  General exam: Appears calm, frail Respiratory system: Clear to auscultation save for faint rales at bases Cardiovascular system: S1 & S2 heard, bradycardic, soft systolic murmur Gastrointestinal system: Abdomen is nondistended, soft and nontender. No organomegaly or masses felt. Normal bowel sounds heard. Central nervous system: Alert and oriented. No focal neurological deficits. Extremities: trace LE edema Skin: No rashes, lesions or ulcers Psychiatry: Judgement and insight appear normal.      Data Reviewed: I have personally reviewed following labs and imaging studies  CBC: Recent Labs  Lab 09/07/20 2255 09/08/20 0533 09/09/20 0412 09/10/20 0448  WBC 9.4 8.7 6.8 7.4  HGB 13.4 11.7* 11.5* 12.2*  HCT 37.7* 33.3* 33.1* 35.3*  MCV 82.1 83.7 85.5 86.5  PLT 284 217 187 258   Basic Metabolic Panel: Recent Labs  Lab 09/07/20 2255 09/08/20 0533 09/08/20 0800 09/09/20 0134 09/09/20 0412 09/09/20 0747 09/09/20 1715 09/10/20 0448 09/11/20 0448  NA 137 137   < > 138  --  136 136 138 137  K 2.6* 2.6*   < > 4.1  --  3.9 4.0 4.1 4.2  CL 98 99   < > 108  --  105 102 106 103  CO2 29 27   < > 25  --  24 27 27 28   GLUCOSE 112* 83   < > 79  --  104* 98 81 92  BUN 19 15   < > 9  --  7* 8 9 16   CREATININE 0.84 0.98  0.96   < > 0.78  --  0.63 0.88 0.93 0.79  CALCIUM 8.4* 7.6*   < > 7.8*  --  7.8* 8.0* 8.0* 8.1*  MG 1.9 2.3  --   --  1.9  --   --  2.0 1.9  PHOS  --  3.2  --   --  2.1*  --   --  2.9 3.6   < > = values in this interval not displayed.   GFR: Estimated Creatinine Clearance: 76.9 mL/min (by C-G  formula based on SCr of 0.79 mg/dL). Liver Function Tests: Recent Labs  Lab 09/07/20 2255  AST 21  ALT 9  ALKPHOS 60  BILITOT 1.0  PROT 6.0*  ALBUMIN 3.3*   No results for input(s): LIPASE, AMYLASE in the last 168 hours. No results for input(s): AMMONIA in the last 168 hours. Coagulation Profile: No results for input(s): INR, PROTIME in the last 168 hours. Cardiac Enzymes: Recent Labs  Lab 09/07/20 2255  CKTOTAL 166   BNP (last  3 results) No results for input(s): PROBNP in the last 8760 hours. HbA1C: No results for input(s): HGBA1C in the last 72 hours. CBG: Recent Labs  Lab 09/10/20 1921 09/10/20 2331 09/11/20 0340 09/11/20 0739 09/11/20 1128  GLUCAP 88 129* 84 74 76   Lipid Profile: No results for input(s): CHOL, HDL, LDLCALC, TRIG, CHOLHDL, LDLDIRECT in the last 72 hours. Thyroid Function Tests: No results for input(s): TSH, T4TOTAL, FREET4, T3FREE, THYROIDAB in the last 72 hours. Anemia Panel: No results for input(s): VITAMINB12, FOLATE, FERRITIN, TIBC, IRON, RETICCTPCT in the last 72 hours. Urine analysis:    Component Value Date/Time   COLORURINE AMBER (A) 09/07/2020 2255   APPEARANCEUR CLOUDY (A) 09/07/2020 2255   LABSPEC 1.024 09/07/2020 2255   PHURINE 6.0 09/07/2020 [place] 09/07/2020 2255   HGBUR NEGATIVE 09/07/2020 2255   BILIRUBINUR NEGATIVE 09/07/2020 2255   KETONESUR 80 (A) 09/07/2020 2255   PROTEINUR 30 (A) 09/07/2020 2255   NITRITE POSITIVE (A) 09/07/2020 2255   LEUKOCYTESUR SMALL (A) 09/07/2020 2255   Sepsis Labs: @LABRCNTIP (procalcitonin:4,lacticidven:4)  ) Recent Results (from the past 240 hour(s))  Urine Culture     Status: Abnormal   Collection Time: 09/07/20 10:55 PM   Specimen: Urine, Random  Result Value Ref Range Status   Specimen Description   Final    URINE, RANDOM Performed at [facility], [address], [place], [place] [address]    Special Requests   Final    Normal Performed at [facility], [address] [place], [place] [address]    Culture >=100,000 COLONIES/mL KLEBSIELLA PNEUMONIAE (A)  Final   Report Status 09/10/2020 FINAL  Final   Organism ID, Bacteria KLEBSIELLA PNEUMONIAE (A)  Final      Susceptibility   Klebsiella pneumoniae - MIC*    AMPICILLIN RESISTANT Resistant     CEFAZOLIN <=4 SENSITIVE Sensitive     CEFEPIME <=0.12 SENSITIVE Sensitive     CEFTRIAXONE <=0.25 SENSITIVE Sensitive     CIPROFLOXACIN <=0.25 SENSITIVE Sensitive     GENTAMICIN <=1 SENSITIVE Sensitive     IMIPENEM <=0.25 SENSITIVE Sensitive     NITROFURANTOIN 64 INTERMEDIATE Intermediate     TRIMETH/SULFA <=20 SENSITIVE Sensitive     AMPICILLIN/SULBACTAM <=2 SENSITIVE Sensitive     PIP/TAZO <=4 SENSITIVE Sensitive     * >=100,000 COLONIES/mL KLEBSIELLA PNEUMONIAE  Resp Panel by RT-PCR (Flu A&B, Covid) Nasopharyngeal Swab     Status: Abnormal   Collection Time: 09/08/20 12:58 AM   Specimen: Nasopharyngeal Swab; Nasopharyngeal(NP) swabs in vial transport medium  Result Value Ref Range Status   SARS Coronavirus 2 by RT PCR POSITIVE (A) NEGATIVE Final    Comment: CRITICAL RESULT CALLED TO, READ BACK BY AND VERIFIED WITH: JUAN HENDERSON AT 0156 09/08/20 MF (NOTE) SARS-CoV-2 target nucleic acids are DETECTED.  The SARS-CoV-2 RNA is generally detectable in upper respiratory specimens during the acute phase of infection. Positive results are indicative of the presence of the identified virus, but do not rule out bacterial infection or co-infection with other pathogens not detected by the test. Clinical correlation with patient history and other diagnostic information is necessary to determine patient infection status. The expected result is Negative.  Fact Sheet for Patients: EntrepreneurPulse.com.au  Fact Sheet for Healthcare Providers: IncredibleEmployment.be  This test is not yet approved or cleared by the [place] FDA and  has been  authorized for detection and/or diagnosis of SARS-CoV-2 by FDA under an Emergency Use Authorization (EUA).  This EUA will  remain in effect (meaning this te st can be used) for the duration of  the COVID-19 declaration under Section 564(b)(1) of the Act, 21 U.S.C. section 360bbb-3(b)(1), unless the authorization is terminated or revoked sooner.     Influenza A by PCR NEGATIVE NEGATIVE Final   Influenza B by PCR NEGATIVE NEGATIVE Final    Comment: (NOTE) The Xpert Xpress SARS-CoV-2/FLU/RSV plus assay is intended as an aid in the diagnosis of influenza from Nasopharyngeal swab specimens and should not be used as a sole basis for treatment. Nasal washings and aspirates are unacceptable for Xpert Xpress SARS-CoV-2/FLU/RSV testing.  Fact Sheet for Patients: EntrepreneurPulse.com.au  Fact Sheet for Healthcare Providers: IncredibleEmployment.be  This test is not yet approved or cleared by the [place] FDA and has been authorized for detection and/or diagnosis of SARS-CoV-2 by FDA under an Emergency Use Authorization (EUA). This EUA will remain in effect (meaning this test can be used) for the duration of the COVID-19 declaration under Section 564(b)(1) of the Act, 21 U.S.C. section 360bbb-3(b)(1), unless the authorization is terminated or revoked.  Performed at [facility], [address] [place], [place] [address]   MRSA PCR Screening     Status: None   Collection Time: 09/08/20 10:14 AM   Specimen: Nasal Mucosa; Nasopharyngeal  Result Value Ref Range Status   MRSA by PCR NEGATIVE NEGATIVE Final    Comment:        The GeneXpert MRSA Assay (FDA approved for NASAL specimens only), is one component of a comprehensive MRSA colonization surveillance program. It is not intended to diagnose MRSA infection nor to guide or monitor treatment for MRSA infections. Performed at [facility], [address] [place],  [place] [address]   Gastrointestinal Panel by PCR , Stool     Status: None   Collection Time: 09/08/20 11:00 PM   Specimen: Stool  Result Value Ref Range Status   Campylobacter species NOT DETECTED NOT DETECTED Final   Plesimonas shigelloides NOT DETECTED NOT DETECTED Final   Salmonella species NOT DETECTED NOT DETECTED Final   Yersinia enterocolitica NOT DETECTED NOT DETECTED Final   Vibrio species NOT DETECTED NOT DETECTED Final   Vibrio cholerae NOT DETECTED NOT DETECTED Final   Enteroaggregative E coli (EAEC) NOT DETECTED NOT DETECTED Final   Enteropathogenic E coli (EPEC) NOT DETECTED NOT DETECTED Final   Enterotoxigenic E coli (ETEC) NOT DETECTED NOT DETECTED Final   Shiga like toxin producing E coli (STEC) NOT DETECTED NOT DETECTED Final   Shigella/Enteroinvasive E coli (EIEC) NOT DETECTED NOT DETECTED Final   Cryptosporidium NOT DETECTED NOT DETECTED Final   Cyclospora cayetanensis NOT DETECTED NOT DETECTED Final   Entamoeba histolytica NOT DETECTED NOT DETECTED Final   Giardia lamblia NOT DETECTED NOT DETECTED Final   Adenovirus F40/41 NOT DETECTED NOT DETECTED Final   Astrovirus NOT DETECTED NOT DETECTED Final   Norovirus GI/GII NOT DETECTED NOT DETECTED Final   Rotavirus A NOT DETECTED NOT DETECTED Final   Sapovirus (I, II, IV, and V) NOT DETECTED NOT DETECTED Final    Comment: Performed at [facility], [address], [place], [place] [address]         Radiology Studies: No results found.      Scheduled Meds: . acidophilus  2 capsule Oral TID  . [START ON 09/12/2020] aspirin  81 mg Oral Pre-Cath  . buPROPion  300 mg Oral Daily  . carbidopa-levodopa  3 tablet Oral TID  . cephALEXin  250 mg Oral Q6H  .  Chlorhexidine Gluconate Cloth  6 each Topical Q0600  . cyproheptadine  4 mg Oral Daily  . enoxaparin (LOVENOX) injection  40 mg Subcutaneous Q24H  . feeding supplement  237 mL Oral TID BM  . fluticasone  2 spray Each Nare Daily  . loperamide  4 mg Oral BID   . melatonin  5 mg Oral QHS  . multivitamin with minerals  1 tablet Oral Daily  . potassium chloride  40 mEq Oral Daily  . sodium chloride flush  3 mL Intravenous Q12H  . sodium chloride flush  3 mL Intravenous Q12H   Continuous Infusions: . sodium chloride    . sodium chloride 50 mL/hr at 09/11/20 0802     LOS: 3 days    Time spent: 61 min    Desma Maxim, MD [facility]   If 7PM-7AM, please contact night-coverage [email or web address] Password [facility] 09/11/2020, 12:49 PM

## 2020-09-11 NOTE — Interval H&P Note (Signed)
History and Physical Interval Note:  09/11/2020 3:54 PM  Darren Thomas.  has presented today for surgery, with the diagnosis of ventricular tachycardia and cardiomyopathy.  The various methods of treatment have been discussed with the patient and family. After consideration of risks, benefits and other options for treatment, the patient has consented to  Procedure(s): LEFT HEART CATH AND CORONARY ANGIOGRAPHY (N/A) as a surgical intervention.  The patient's history has been reviewed, patient examined, no change in status, stable for surgery.  I have reviewed the patient's chart and labs.  Questions were answered to the patient's satisfaction.    Cath Lab Visit (complete for each Cath Lab visit)  Clinical Evaluation Leading to the Procedure:   ACS: Yes.    Non-ACS:  N/A  [name]

## 2020-09-11 NOTE — Progress Notes (Signed)
OT Cancellation Note  Patient Details Name: Darren Thomas. MRN: [id number] DOB: 06/01/1945   Cancelled Treatment:    Reason Eval/Treat Not Completed: Medical issues which prohibited therapy. Per chart review, pt scheduled for cardiac catheretization this PM. OT to re-attempt at later date as able. Thank you.  Fredirick Maudlin, OTR/L [place]

## 2020-09-11 NOTE — Progress Notes (Signed)
Progress Note  Patient Name: Darren Thomas. Date of Encounter: 09/11/2020  [facility] Cardiologist: Agbor-Etang  Subjective   Patient feels well, denying chest pain, shortness of breath, and palpitations.  Inpatient Medications    Scheduled Meds: . dextrose      . acidophilus  2 capsule Oral TID  . [START ON 09/12/2020] aspirin  81 mg Oral Daily  . buPROPion  300 mg Oral Daily  . carbidopa-levodopa  3 tablet Oral TID  . cephALEXin  250 mg Oral Q6H  . Chlorhexidine Gluconate Cloth  6 each Topical Q0600  . cyproheptadine  4 mg Oral Daily  . [START ON 09/12/2020] enoxaparin (LOVENOX) injection  40 mg Subcutaneous Q24H  . feeding supplement  237 mL Oral TID BM  . fluticasone  2 spray Each Nare Daily  . loperamide  4 mg Oral BID  . melatonin  5 mg Oral QHS  . multivitamin with minerals  1 tablet Oral Daily  . potassium chloride  40 mEq Oral Daily  . sodium chloride flush  3 mL Intravenous Q12H  . sodium chloride flush  3 mL Intravenous Q12H  . sodium chloride flush  3 mL Intravenous Q12H   Continuous Infusions: . sodium chloride 75 mL/hr at 09/11/20 1705  . sodium chloride     PRN Meds: sodium chloride, acetaminophen **OR** acetaminophen, alum & mag hydroxide-simeth, bisacodyl, hydrALAZINE, labetalol, loperamide, sodium chloride flush   Vital Signs    Vitals:   09/11/20 1406 09/11/20 1657 09/11/20 1700 09/11/20 1715  BP: (!) 83/59 (!) 113/55 110/62 (!) 112/49  Pulse: (!) 46 (!) 52 (!) 50 (!) 49  Resp: 12 13 14 13   Temp:      TempSrc:      SpO2: 100% 98% 97% 97%  Weight:      Height:        Intake/Output Summary (Last 24 hours) at 09/11/2020 1717 Last data filed at 09/11/2020 1500 Gross per 24 hour  Intake 346.73 ml  Output 900 ml  Net -553.27 ml   Last 3 Weights 09/11/2020 09/10/2020 09/09/2020  Weight (lbs) 154 lb 15.7 oz 157 lb 3 oz 157 lb 3 oz  Weight (kg) 70.3 kg 71.3 kg 71.3 kg      Telemetry    Sinus bradycardia with PACs and aberrant conduction-  Personally Reviewed  ECG   No new tracing (EKG ordered yesterday canceled by nursing for unclear reasons).  Physical Exam   GEN: No acute distress.   Neck: No JVD Cardiac:  Bradycardic but regular; no murmurs, rubs, or gallops.  Respiratory: Clear to auscultation bilaterally. GI: Soft, nontender, non-distended  MS: No edema; No deformity. Neuro:  Nonfocal  Psych: Normal affect   Labs    High Sensitivity Troponin:   Recent Labs  Lab 09/07/20 2255 09/08/20 0533 09/08/20 0800  TROPONINIHS 7 9 8       Chemistry Recent Labs  Lab 09/07/20 2255 09/08/20 0533 09/09/20 1715 09/10/20 0448 09/11/20 0448  NA 137   < > 136 138 137  K 2.6*   < > 4.0 4.1 4.2  CL 98   < > 102 106 103  CO2 29   < > 27 27 28   GLUCOSE 112*   < > 98 81 92  BUN 19   < > 8 9 16   CREATININE 0.84   < > 0.88 0.93 0.79  CALCIUM 8.4*   < > 8.0* 8.0* 8.1*  PROT 6.0*  --   --   --   --  ALBUMIN 3.3*  --   --   --   --   AST 21  --   --   --   --   ALT 9  --   --   --   --   ALKPHOS 60  --   --   --   --   BILITOT 1.0  --   --   --   --   GFRNONAA >60   < > >60 >60 >60  ANIONGAP 10   < > 7 5 6    < > = values in this interval not displayed.     Hematology Recent Labs  Lab 09/08/20 0533 09/09/20 0412 09/10/20 0448  WBC 8.7 6.8 7.4  RBC 3.98* 3.87* 4.08*  HGB 11.7* 11.5* 12.2*  HCT 33.3* 33.1* 35.3*  MCV 83.7 85.5 86.5  MCH 29.4 29.7 29.9  MCHC 35.1 34.7 34.6  RDW 14.1 14.6 15.1  PLT 217 187 184    BNPNo results for input(s): BNP, PROBNP in the last 168 hours.   DDimer No results for input(s): DDIMER in the last 168 hours.   Radiology    CARDIAC CATHETERIZATION  Result Date: 09/11/2020 Conclusions: 1. Mild to moderate, nonobstructive coronary artery disease.  No critical lesion identified to explain patient's ventricular tachycardia and/or cardiomyopathy. 2. Normal left ventricular filling pressure. Recommendations: 1. Medical therapy and risk factor modification to prevent progression  of coronary artery disease. 2. EP consultation for further recommendations regarding management of syncope and ventricular tachycardia. Nelva Bush, MD [facility]    Cardiac Studies   See catheterization results above.  TTE (09/09/2020): 1. Left ventricular ejection fraction, by estimation, is 45 to 50%. The  left ventricle has mildly decreased function. The left ventricle has no  regional wall motion abnormalities. Left ventricular diastolic parameters  were normal.  2. Right ventricular systolic function is normal. The right ventricular  size is normal. There is normal pulmonary artery systolic pressure.  3. The mitral valve is grossly normal. Trivial mitral valve  regurgitation.  4. The aortic valve is grossly normal. Aortic valve regurgitation is not  visualized. Mild aortic valve sclerosis is present, with no evidence of  aortic valve stenosis.  5. The inferior vena cava is normal in size with greater than 50%  respiratory variability, suggesting right atrial pressure of 3 mmHg.   Patient Profile     75 y.o. male with history of stage IV lung cancer and Parkinson's disease, admitted with syncope and found to have ventricular tachycardia and cardiomyopathy.  Assessment & Plan    Ventricular tachycardia: No further ventricular ectopy appreciated following discontinuation of lidocaine and metoprolol.  Ventricular rates remain low with occasional PACs.  Catheterization today with nonobstructive CAD.  Suspect VT may have been mediated by significant electrolyte disturbances and prolonged QT on admission.  Repeat EKG ordered again to follow-up QT interval.  EP consultation tomorrow for further recommendations.  Avoid QT prolonging agents.  Maintain potassium and magnesium level close greater than 4.0 and 2.0, respectively.  Nonischemic cardiomyopathy: LVEF mildly elevated on admission.  Patient appears euvolemic on exam with low normal LVEDP during catheterization  today.  Defer adding beta-blocker and ACE inhibitor/ARB at this time given bradycardia and borderline hypotension.  Gentle post catheterization.  Nonobstructive coronary artery disease: Mild to moderate nonobstructive CAD noted on catheterization today.  Aspirin 81 mg daily.  Defer adding statin at this time in the setting of comorbidities and LDL on admission of 69.  For questions  or updates, please contact [place] Please consult [email or web address] for contact info under [facility].     Signed, Nelva Bush, MD  09/11/2020, 5:17 PM

## 2020-09-11 NOTE — Progress Notes (Signed)
PT Cancellation Note  Patient Details Name: Darren Thomas. MRN: [id number] DOB: 22-Dec-1945   Cancelled Treatment:     PT attempt. Pt off floor in cath lab. Will return tomorrow and continue to follow per POC.   Willette Pa 09/11/2020, 3:47 PM

## 2020-09-11 NOTE — Progress Notes (Signed)
Hypoglycemic Event  CBG: 54 @ 1652  Treatment: 8oz orange juice  Symptoms: None  Follow-up CBG: Time:1706 CBG Result:52  Repeat Treatment: 25g D50 @ 1710  Symptoms: None  Follow-up CBG: Time:1726 CBG Result:116  Possible Reasons for Event: Inadequate meal intake  Comments/MD notified:Dr. Wouk   Pt asymptomatic and in no acute distress.  Algis Greenhouse

## 2020-09-12 ENCOUNTER — Encounter: Payer: Self-pay | Admitting: Internal Medicine

## 2020-09-12 DIAGNOSIS — R9431 Abnormal electrocardiogram [ECG] [EKG]: Secondary | ICD-10-CM

## 2020-09-12 LAB — BASIC METABOLIC PANEL
Anion gap: 7 (ref 5–15)
BUN: 13 mg/dL (ref 8–23)
CO2: 28 mmol/L (ref 22–32)
Calcium: 7.9 mg/dL — ABNORMAL LOW (ref 8.9–10.3)
Chloride: 104 mmol/L (ref 98–111)
Creatinine, Ser: 0.72 mg/dL (ref 0.61–1.24)
GFR, Estimated: >60 mL/min (ref >60)
Glucose, Bld: 80 mg/dL (ref 70–99)
Potassium: 4.2 mmol/L (ref 3.5–5.1)
Sodium: 139 mmol/L (ref 135–145)

## 2020-09-12 LAB — GLUCOSE, CAPILLARY
Glucose-Capillary: 55 mg/dL — ABNORMAL LOW (ref 70–99)
Glucose-Capillary: 62 mg/dL — ABNORMAL LOW (ref 70–99)
Glucose-Capillary: 125 mg/dL — ABNORMAL HIGH (ref 70–99)
Glucose-Capillary: 84 mg/dL (ref 70–99)
Glucose-Capillary: 119 mg/dL — ABNORMAL HIGH (ref 70–99)
Glucose-Capillary: 100 mg/dL — ABNORMAL HIGH (ref 70–99)
Glucose-Capillary: 125 mg/dL — ABNORMAL HIGH (ref 70–99)

## 2020-09-12 LAB — MAGNESIUM: Magnesium: 2 mg/dL (ref 1.7–2.4)

## 2020-09-12 LAB — PHOSPHORUS: Phosphorus: 3.7 mg/dL (ref 2.5–4.6)

## 2020-09-12 NOTE — Progress Notes (Signed)
Pt A&OX4. VSS. BP soft but stable. In no acute distress. Complains of heel pain bilaterally but is blanchable, heels propped high on pillows. EKG completed this AM.

## 2020-09-12 NOTE — Consult Note (Signed)
[facility] for Electrolyte Monitoring and Replacement   Recent Labs: Potassium (mmol/L)  Date Value  09/12/2020 4.2   Magnesium (mg/dL)  Date Value  09/12/2020 2.0   Calcium (mg/dL)  Date Value  09/12/2020 7.9 (L)   Albumin (g/dL)  Date Value  09/07/2020 3.3 (L)   Phosphorus (mg/dL)  Date Value  09/12/2020 3.7   Sodium (mmol/L)  Date Value  09/12/2020 139  05/14/2016 141   Assessment: Patient is a 75 y/o M with medical history including Parkinson's disease, lung cancer, depression / PTSD, recent SARS-CoV-2 infection who presented to the ED for evaluation of syncope. Noted to have multiple episodes of NSVT in the ED. QTc prolonged (amio stopped) . Worsened on amiodarone and was changed to lidocaine drip. Pharmacy consulted to assist with electrolyte monitoring and replacement as indicated.  Patient also with NVD. Started on loperamide. KCl 35mEq PO daily   Goal of Therapy:  K > 4 Mg > 2 All other electrolytes within normal limits  Plan:  -- Electrolytes WNL, no replacement needed at this time -- Follow up electrolytes with AM labs  Sherilyn Banker, PharmD Pharmacy Resident  09/12/2020 6:30 AM

## 2020-09-12 NOTE — Consult Note (Signed)
Electrophysiology consultation:   Patient ID: Darren Thomas. MRN: [id number]; DOB: January 11, 1946  Admit date: 09/07/2020 Date of Consult: 09/12/2020  PCP:  Darren Sizer, MD    Patient Profile:   Darren Thomas. is a 75 y.o. male with a hx of Parkinson's disease, lung cancer, depression, PTSD and recent COVID infection 3 weeks ago who is being seen 09/12/2020 for the evaluation of polymorphic ventricular tachycardia at the request of Dr. Saunders Thomas.  History of Present Illness:   Darren Thomas presented to the emergency department for admission on April 30 after a witnessed syncopal episode at home.  He presented to the emergency department where he was observed to have 3 episodes of nonsustained ventricular tachycardia associated with dizziness and lightheadedness.  There were no additional syncopal episodes in the ER.  He did have COVID approximately 3 weeks prior to admission and is continuing to have difficulty with nausea vomiting and diarrhea.  He has not been able to eat very much.  Admission labs showed significant electrolyte derangements with a potassium of 2.4.  He was started on an amiodarone bolus and drip in the emergency department and was admitted for observation.  His electrolytes have since been replaced and the amiodarone was stopped because of QT prolongation.  He briefly was on lidocaine drip after the amnio was discontinued but this has now also been stopped.  Now that his electrolytes are stable, his heart rhythm has remained normal without further episodes of nonsustained ventricular tachycardia.  He has had an echocardiogram which showed a mildly reduced ejection fraction of 45 to 50%.  The right ventricle was normal.  There were no significant valvular abnormalities.  He had a left heart catheterization by Dr. Saunders Thomas on Sep 11, 2020 which showed nonobstructive coronary artery disease.  This morning he is up in bed eating breakfast.  He tells me he is feeling overall okay.  His stools  have improved somewhat but he still having several loose stools per day.  He tells me that prior to his COVID-19 infection he did not have this much trouble with diarrhea.  He did tell me that he was taking some over-the-counter medications for his diarrhea prior to admission to the hospital.  He thinks it was loperamide but he is not completely sure.   Past Medical History:  Diagnosis Date  . Abnormality of gait   . Back pain   . Cancer ([place]) 04/2019   stage IV lung cancer being treated at [facility], left hip muscle, stomach, top of small intesting, liver and lymphnode near lungs  . Cervical spondylosis with myelopathy 01/06/2014  . Depression   . Insomnia   . Parkinson disease ([place]) 04/2018   diagnosed by neurology at [facility]  . PTSD (post-traumatic stress disorder)   . PTSD (post-traumatic stress disorder)     Past Surgical History:  Procedure Laterality Date  . C-spine     C-3-7 removed  . LEFT HEART CATH AND CORONARY ANGIOGRAPHY N/A 09/11/2020   Procedure: LEFT HEART CATH AND CORONARY ANGIOGRAPHY;  Surgeon: Darren Bush, MD;  Location: [facility];  Service: Cardiovascular;  Laterality: N/A;  . SCALP LESION REMOVAL W/ FLAP AND SKIN GRAFT    . SKIN CANCER EXCISION Left    squamos cell cancinoma       Inpatient Medications: Scheduled Meds: . acidophilus  2 capsule Oral TID  . aspirin  81 mg Oral Daily  . buPROPion  300 mg Oral Daily  . carbidopa-levodopa  3 tablet Oral TID  . cephALEXin  250 mg Oral Q6H  . Chlorhexidine Gluconate Cloth  6 each Topical Q0600  . cyproheptadine  4 mg Oral Daily  . enoxaparin (LOVENOX) injection  40 mg Subcutaneous Q24H  . feeding supplement  237 mL Oral TID BM  . fluticasone  2 spray Each Nare Daily  . loperamide  4 mg Oral BID  . melatonin  5 mg Oral QHS  . multivitamin with minerals  1 tablet Oral Daily  . potassium chloride  40 mEq Oral Daily  . sodium chloride flush  3 mL Intravenous Q12H  . sodium chloride flush  3 mL  Intravenous Q12H  . sodium chloride flush  3 mL Intravenous Q12H   Continuous Infusions: . sodium chloride     PRN Meds: sodium chloride, acetaminophen **OR** acetaminophen, alum & mag hydroxide-simeth, bisacodyl, loperamide, sodium chloride flush  Allergies:    Allergies  Allergen Reactions  . Augmentin [Amoxicillin-Pot Clavulanate] Diarrhea  . Duloxetine     Other reaction(s): Chest pain    Social History:   Social History   Socioeconomic History  . Marital status: Married    Spouse name: Derl Barrow  . Number of children: 2  . 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 Highest education level: 8th grade  Occupational History  . Occupation: retired  Tobacco Use  . Smoking status: Former Smoker    Packs/day: 1.00    Years: 20.00    Pack years: 20.00    Types: Cigarettes    Quit date: 1973    Years since quitting: 49.3  . Smokeless tobacco: Never Used  . Tobacco comment: smoking cessation materials not required  Vaping Use  . Vaping Use: Never used  Substance and Sexual Activity  . Alcohol use: No  . Drug use: No  . Sexual activity: Not Currently  Other Topics Concern  . Not on file  Social History Narrative  . Not on file   Social Determinants of Health   Financial Resource Strain: Not on file  Food Insecurity: Not on file  Transportation Needs: Not on file  Physical Activity: Not on file  Stress: Not on file  Social Connections: Not on file  Intimate Partner Violence: Not on file    Family History:    Family History  Problem Relation Age of Onset  . Cervical cancer Mother   . Heart Problems Father   . Diabetes Father   . Asthma Brother   . Emphysema Brother        was a smoker  . Diabetes type I Son   . Colon cancer Son 55     ROS:  Please see the history of present illness.   All other ROS reviewed and negative.     Physical Exam/Data:   Vitals:   09/12/20 0604 09/12/20 0630 09/12/20 0730 09/12/20 0745  BP: (!) 91/47 94/63 (!) 112/51    Pulse: 86 66 72   Resp: 13 13 (!) 24   Temp:      TempSrc:      SpO2: 100% 97%  100%  Weight:      Height:        Intake/Output Summary (Last 24 hours) at 09/12/2020 0814 Last data filed at 09/12/2020 0600 Gross per 24 hour  Intake 789.7 ml  Output 800 ml  Net -10.3 ml   Last 3 Weights 09/12/2020 09/11/2020 09/10/2020  Weight (lbs) 152 lb 8.9 oz 154 lb 15.7 oz 157 lb 3 oz  Weight (kg) 69.2 kg 70.3 kg 71.3 kg     Body mass index is 19.59 kg/m.   General: Thin, elderly in bed eating breakfast at 90 degrees HEENT: normal Lymph: no adenopathy Neck: no JVD Endocrine:  No thryomegaly Vascular: No carotid bruits; FA pulses 2+ bilaterally without bruits  Cardiac:  normal S1, S2; RRR; no murmur Lungs:  clear to auscultation bilaterally, no wheezing, rhonchi or rales  Abd: soft, nontender, no hepatomegaly  Ext: no edema Musculoskeletal:  No deformities, BUE and BLE strength normal and equal Skin: warm and dry  Neuro:  CNs 2-12 intact, no focal abnormalities noted Psych:  Normal affect   Telemetry:  Telemetry was personally reviewed and demonstrates: Sinus rhythm.  No recent episodes of NSVT.  QT appears to be back within normal limits.  Relevant CV Studies:  Sep 11, 2020 left heart catheterization personally reviewed Conclusions: 1. Mild to moderate, nonobstructive coronary artery disease.  No critical lesion identified to explain patient's ventricular tachycardia and/or cardiomyopathy. 2. Normal left ventricular filling pressure. Recommendations: 1. Medical therapy and risk factor modification to prevent progression of coronary artery disease. 2. EP consultation for further recommendations regarding management of syncope and ventricular tachycardia.  September 07, 2020 admission EKG QT prolonged.  Sinus rhythm.     September 08, 2020 EKG Significant QT prolongation   09/07/2020 Tele Strips NSVT QT prolonged    Laboratory Data:  High Sensitivity Troponin:   Recent Labs  Lab  09/07/20 2255 09/08/20 0533 09/08/20 0800  TROPONINIHS 7 9 8      Chemistry Recent Labs  Lab 09/10/20 0448 09/11/20 0448 09/12/20 0322  NA 138 137 139  K 4.1 4.2 4.2  CL 106 103 104  CO2 27 28 28   GLUCOSE 81 92 80  BUN 9 16 13   CREATININE 0.93 0.79 0.72  CALCIUM 8.0* 8.1* 7.9*  GFRNONAA >60 >60 >60  ANIONGAP 5 6 7     Recent Labs  Lab 09/07/20 2255  PROT 6.0*  ALBUMIN 3.3*  AST 21  ALT 9  ALKPHOS 60  BILITOT 1.0   Hematology Recent Labs  Lab 09/08/20 0533 09/09/20 0412 09/10/20 0448  WBC 8.7 6.8 7.4  RBC 3.98* 3.87* 4.08*  HGB 11.7* 11.5* 12.2*  HCT 33.3* 33.1* 35.3*  MCV 83.7 85.5 86.5  MCH 29.4 29.7 29.9  MCHC 35.1 34.7 34.6  RDW 14.1 14.6 15.1  PLT 217 187 184   BNPNo results for input(s): BNP, PROBNP in the last 168 hours.  DDimer No results for input(s): DDIMER in the last 168 hours.   Radiology/Studies:  CARDIAC CATHETERIZATION  Result Date: 09/11/2020 Conclusions: 1. Mild to moderate, nonobstructive coronary artery disease.  No critical lesion identified to explain patient's ventricular tachycardia and/or cardiomyopathy. 2. Normal left ventricular filling pressure. Recommendations: 1. Medical therapy and risk factor modification to prevent progression of coronary artery disease. 2. EP consultation for further recommendations regarding management of syncope and ventricular tachycardia. Darren Bush, MD [facility]   ECHOCARDIOGRAM COMPLETE  Result Date: 09/09/2020    ECHOCARDIOGRAM REPORT   Patient Name:   Darren Thomas. Date of Exam: 09/09/2020 Medical Rec #:  [id number]         Height:       74.0 in Accession #:    [id number]        Weight:       157.2 lb Date of Birth:  August 16, 1945         BSA:  1.962 m Patient Age:    19 years          BP: Patient Gender: M                 HR: Exam Location: Indications:     R55 Syncope; R00.0 Tachycardia  Referring Phys:  [id number] ALEXIS HUGELMEYER Diagnosing Phys: Kate Sable MD IMPRESSIONS  1.  Left ventricular ejection fraction, by estimation, is 45 to 50%. The left ventricle has mildly decreased function. The left ventricle has no regional wall motion abnormalities. Left ventricular diastolic parameters were normal.  2. Right ventricular systolic function is normal. The right ventricular size is normal. There is normal pulmonary artery systolic pressure.  3. The mitral valve is grossly normal. Trivial mitral valve regurgitation.  4. The aortic valve is grossly normal. Aortic valve regurgitation is not visualized. Mild aortic valve sclerosis is present, with no evidence of aortic valve stenosis.  5. The inferior vena cava is normal in size with greater than 50% respiratory variability, suggesting right atrial pressure of 3 mmHg. FINDINGS  Left Ventricle: Left ventricular ejection fraction, by estimation, is 45 to 50%. The left ventricle has mildly decreased function. The left ventricle has no regional wall motion abnormalities. The left ventricular internal cavity size was normal in size. There is no left ventricular hypertrophy. Left ventricular diastolic parameters were normal. Right Ventricle: The right ventricular size is normal. No increase in right ventricular wall thickness. Right ventricular systolic function is normal. There is normal pulmonary artery systolic pressure. The tricuspid regurgitant velocity is 2.32 m/s, and  with an assumed right atrial pressure of 3 mmHg, the estimated right ventricular systolic pressure is 67.2 mmHg. Left Atrium: Left atrial size was normal in size. Right Atrium: Right atrial size was normal in size. Pericardium: There is no evidence of pericardial effusion. Mitral Valve: The mitral valve is grossly normal. Trivial mitral valve regurgitation. Tricuspid Valve: The tricuspid valve is grossly normal. Tricuspid valve regurgitation is trivial. Aortic Valve: The aortic valve is grossly normal. Aortic valve regurgitation is not visualized. Mild aortic valve sclerosis is  present, with no evidence of aortic valve stenosis. Aortic valve mean gradient measures 6.0 mmHg. Aortic valve peak gradient measures 10.0 mmHg. Aortic valve area, by VTI measures 2.15 cm. Pulmonic Valve: The pulmonic valve was not well visualized. Pulmonic valve regurgitation is not visualized. Aorta: The aortic root is normal in size and structure. Venous: The inferior vena cava is normal in size with greater than 50% respiratory variability, suggesting right atrial pressure of 3 mmHg. IAS/Shunts: No atrial level shunt detected by color flow Doppler.  LEFT VENTRICLE PLAX 2D LVIDd:         4.27 cm  Diastology LVIDs:         3.64 cm  LV e' medial:    6.64 cm/s LV PW:         0.91 cm  LV E/e' medial:  15.8 LV IVS:        1.03 cm  LV e' lateral:   10.20 cm/s LVOT diam:     2.00 cm  LV E/e' lateral: 10.3 LV SV:         78 LV SV Index:   40 LVOT Area:     3.14 cm  RIGHT VENTRICLE RV S prime:     12.20 cm/s TAPSE (M-mode): 2.1 cm LEFT ATRIUM             Index LA diam:        3.00  cm 1.53 cm/m LA Vol (A2C):   61.7 ml 31.45 ml/m LA Vol (A4C):   36.4 ml 18.55 ml/m LA Biplane Vol: 47.3 ml 24.11 ml/m  AORTIC VALVE                    PULMONIC VALVE AV Area (Vmax):    2.33 cm     PV Vmax:       0.86 m/s AV Area (Vmean):   1.87 cm     PV Peak grad:  3.0 mmHg AV Area (VTI):     2.15 cm AV Vmax:           158.00 cm/s AV Vmean:          121.000 cm/s AV VTI:            0.362 m AV Peak Grad:      10.0 mmHg AV Mean Grad:      6.0 mmHg LVOT Vmax:         117.00 cm/s LVOT Vmean:        71.900 cm/s LVOT VTI:          0.248 m LVOT/AV VTI ratio: 0.69  AORTA Ao Root diam: 3.40 cm MITRAL VALVE                TRICUSPID VALVE MV Area (PHT): 2.53 cm     TR Peak grad:   21.5 mmHg MV E velocity: 105.00 cm/s  TR Vmax:        232.00 cm/s MV A velocity: 73.70 cm/s MV E/A ratio:  1.42         SHUNTS                             Systemic VTI:  0.25 m                             Systemic Diam: 2.00 cm Kate Sable MD Electronically signed  by Kate Sable MD Signature Date/Time: 09/09/2020/3:11:05 PM    Final      Assessment and Plan:   1. Nonsustained VT and syncope Patient presented with a witnessed episode of syncope that was preceded by few seconds of feeling hot and flushed.  Very likely that there was an arrhythmic component given his early telemetry recordings showing nonsustained VT and an EKG showing significantly prolonged QTC.  I suspect much of his QTC prolongation was related to his electrolyte derangements but there was clearly a superimposed pharmacologic component-likely loperamide.  Thankfully, he is electrically quiescent now that his electrolytes are within normal limits.  I would recommend close monitoring of his electrolytes and keeping his potassium greater than 4 and magnesium greater than 2.  I recommend he avoid exposure to QT prolonging drugs.  He clearly is susceptible to QT prolongation from medications.    I do not think an ICD is indicated for Mr. Delage given the reversibility of the cause of his NSVT and the fact that he has an active metastatic malignancy.  After discussing ICD with the patient this morning he also tells me he is not interested in pursuing ICD given it does not align with his goals of care.  I would not initiate antiarrhythmics at this time.  Recommend pharmacy review all of his medications prior to discharge and provide a list of QT prolonging medications to avoid.   For questions or updates, please contact  [facility] Please consult [email or web address] for contact info under    Signed, Vickie Epley, MD  09/12/2020 8:14 AM

## 2020-09-12 NOTE — Progress Notes (Signed)
PROGRESS NOTE    Darren Thomas.  AQT:[id number] DOB: 1946/04/21 DOA: 09/07/2020 PCP: Steele Sizer, MD   CC: syncope Brief Narrative:  Darren Thomas a 75 y.o.malewith a known history of Parkinson's, lung C, depression/PTSD, COVID 3 weeks agopresents to the emergency department for evaluation of syncope. Patient was in a usual state of health until recent COVID infection, he has not fully recovered his appetite, and has been having N, V/D. While in the ED,l he had a 3 episodes of nonsustained VT associated with dizziness and lightheadedness.  He is a seen by cardiology and electrophysiology, VT was thought to be due to electrolytes abnormality including hypokalemia.  Patient also was taking Imodium for diarrhea.  Decision was made against AICD.   Assessment & Plan:   Active Problems:   Ventricular tachycardia (HCC)   Hypokalemia   Syncope   Protein-calorie malnutrition, severe   Nonischemic cardiomyopathy ([place])  #1.  Syncope secondary to VT. Nonsustained ventricular tachycardia secondary to hypokalemia and  Hypokalemia Hypophosphatemia. Prolonged QT  Lites has been normalized so far, he still has some loose stool today, will monitor another day to make sure his potassium and magnesium continue to be normalized.  Will prescribe oral potassium and magnesium at time of discharge.  #2.  Nausea vomiting diarrhea. Avoid Imodium.  Continue monitor magnesium and potassium.  3.  Klebsiella urinary tract infection. Antibiotic completed today.  #4 COVID infection. Asymptomatic, no hypoxemia.  5.  Lungs CTA. Follow-up with oncology as outpatient.  #6.  Hypoglycemia. Patient does not have diabetes.  Will follow.  Check a.m. cortisol level.    DVT prophylaxis: Lovenox Code Status: DNR Family Communication:  Disposition Plan:  .   Status is: Inpatient  Remains inpatient appropriate because:Inpatient level of care appropriate due to severity of illness   Dispo: The  patient is from: Home              Anticipated d/c is to: Home              Patient currently is not medically stable to d/c.   Difficult to place patient No        I/O last 3 completed shifts: In: 789.7 [I.V.:739.7; IV Piggyback:50] Out: 1700 [Urine:1700] No intake/output data recorded.     Consultants:   Cardiology  Procedures: None  Antimicrobials: None  Subjective: Patient doing well today.  He has been having loose stools for the last 3 or 4 days, he had a 2 bowel movement today which are still loose.  He has a better appetite without nausea vomiting.  No abdominal pain. No short of breath or cough. No dysuria hematuria No fever chills  No chest pain or palpitation.  Objective: Vitals:   09/12/20 1300 09/12/20 1330 09/12/20 1400 09/12/20 1430  BP: (!) 98/50 (!) 107/47 (!) 107/47 (!) 99/47  Pulse: (!) 58 (!) 59 61 63  Resp: 18 20 17  (!) 21  Temp: (!) 97.4 F (36.3 C)     TempSrc: Axillary     SpO2: 99% 100% 100% 99%  Weight:      Height:        Intake/Output Summary (Last 24 hours) at 09/12/2020 1508 Last data filed at 09/12/2020 0600 Gross per 24 hour  Intake 392.97 ml  Output 800 ml  Net -407.03 ml   Filed Weights   09/10/20 0500 09/11/20 0500 09/12/20 0451  Weight: 71.3 kg 70.3 kg 69.2 kg    Examination:  General exam: Appears calm and comfortable  Respiratory system: Clear to auscultation. Respiratory effort normal. Cardiovascular system: S1 & S2 heard, RRR. No JVD, murmurs, rubs, gallops or clicks. No pedal edema. Gastrointestinal system: Abdomen is nondistended, soft and nontender. No organomegaly or masses felt. Normal bowel sounds heard. Central nervous system: Alert and oriented. No focal neurological deficits. Extremities: Symmetric 5 x 5 power. Skin: No rashes, lesions or ulcers Psychiatry: Judgement and insight appear normal. Mood & affect appropriate.     Data Reviewed: I have personally reviewed following labs and imaging  studies  CBC: Recent Labs  Lab 09/07/20 2255 09/08/20 0533 09/09/20 0412 09/10/20 0448  WBC 9.4 8.7 6.8 7.4  HGB 13.4 11.7* 11.5* 12.2*  HCT 37.7* 33.3* 33.1* 35.3*  MCV 82.1 83.7 85.5 86.5  PLT 284 217 187 130   Basic Metabolic Panel: Recent Labs  Lab 09/08/20 0533 09/08/20 0800 09/09/20 0412 09/09/20 0747 09/09/20 1715 09/10/20 0448 09/11/20 0448 09/12/20 0322  NA 137   < >  --  136 136 138 137 139  K 2.6*   < >  --  3.9 4.0 4.1 4.2 4.2  CL 99   < >  --  105 102 106 103 104  CO2 27   < >  --  24 27 27 28 28   GLUCOSE 83   < >  --  104* 98 81 92 80  BUN 15   < >  --  7* 8 9 16 13   CREATININE 0.98  0.96   < >  --  0.63 0.88 0.93 0.79 0.72  CALCIUM 7.6*   < >  --  7.8* 8.0* 8.0* 8.1* 7.9*  MG 2.3  --  1.9  --   --  2.0 1.9 2.0  PHOS 3.2  --  2.1*  --   --  2.9 3.6 3.7   < > = values in this interval not displayed.   GFR: Estimated Creatinine Clearance: 75.7 mL/min (by C-G formula based on SCr of 0.72 mg/dL). Liver Function Tests: Recent Labs  Lab 09/07/20 2255  AST 21  ALT 9  ALKPHOS 60  BILITOT 1.0  PROT 6.0*  ALBUMIN 3.3*   No results for input(s): LIPASE, AMYLASE in the last 168 hours. No results for input(s): AMMONIA in the last 168 hours. Coagulation Profile: No results for input(s): INR, PROTIME in the last 168 hours. Cardiac Enzymes: Recent Labs  Lab 09/07/20 2255  CKTOTAL 166   BNP (last 3 results) No results for input(s): PROBNP in the last 8760 hours. HbA1C: No results for input(s): HGBA1C in the last 72 hours. CBG: Recent Labs  Lab 09/11/20 2048 09/11/20 2335 09/12/20 0456 09/12/20 0556 09/12/20 0745  GLUCAP 91 106* 55* 62* 125*   Lipid Profile: No results for input(s): CHOL, HDL, LDLCALC, TRIG, CHOLHDL, LDLDIRECT in the last 72 hours. Thyroid Function Tests: No results for input(s): TSH, T4TOTAL, FREET4, T3FREE, THYROIDAB in the last 72 hours. Anemia Panel: No results for input(s): VITAMINB12, FOLATE, FERRITIN, TIBC, IRON,  RETICCTPCT in the last 72 hours. Sepsis Labs: No results for input(s): PROCALCITON, LATICACIDVEN in the last 168 hours.  Recent Results (from the past 240 hour(s))  Urine Culture     Status: Abnormal   Collection Time: 09/07/20 10:55 PM   Specimen: Urine, Random  Result Value Ref Range Status   Specimen Description   Final    URINE, RANDOM Performed at [facility], [address], [place],  [address]    Special Requests   Final  Normal Performed at [facility], [address] [place], [place] [address]    Culture >=100,000 COLONIES/mL KLEBSIELLA PNEUMONIAE (A)  Final   Report Status 09/10/2020 FINAL  Final   Organism ID, Bacteria KLEBSIELLA PNEUMONIAE (A)  Final      Susceptibility   Klebsiella pneumoniae - MIC*    AMPICILLIN RESISTANT Resistant     CEFAZOLIN <=4 SENSITIVE Sensitive     CEFEPIME <=0.12 SENSITIVE Sensitive     CEFTRIAXONE <=0.25 SENSITIVE Sensitive     CIPROFLOXACIN <=0.25 SENSITIVE Sensitive     GENTAMICIN <=1 SENSITIVE Sensitive     IMIPENEM <=0.25 SENSITIVE Sensitive     NITROFURANTOIN 64 INTERMEDIATE Intermediate     TRIMETH/SULFA <=20 SENSITIVE Sensitive     AMPICILLIN/SULBACTAM <=2 SENSITIVE Sensitive     PIP/TAZO <=4 SENSITIVE Sensitive     * >=100,000 COLONIES/mL KLEBSIELLA PNEUMONIAE  Resp Panel by RT-PCR (Flu A&B, Covid) Nasopharyngeal Swab     Status: Abnormal   Collection Time: 09/08/20 12:58 AM   Specimen: Nasopharyngeal Swab; Nasopharyngeal(NP) swabs in vial transport medium  Result Value Ref Range Status   SARS Coronavirus 2 by RT PCR POSITIVE (A) NEGATIVE Final    Comment: CRITICAL RESULT CALLED TO, READ BACK BY AND VERIFIED WITH: JUAN HENDERSON AT 0156 09/08/20 MF (NOTE) SARS-CoV-2 target nucleic acids are DETECTED.  The SARS-CoV-2 RNA is generally detectable in upper respiratory specimens during the acute phase of infection. Positive results are indicative of the presence of the identified virus, but do not  rule out bacterial infection or co-infection with other pathogens not detected by the test. Clinical correlation with patient history and other diagnostic information is necessary to determine patient infection status. The expected result is Negative.  Fact Sheet for Patients: EntrepreneurPulse.com.au  Fact Sheet for Healthcare Providers: IncredibleEmployment.be  This test is not yet approved or cleared by the [place] FDA and  has been authorized for detection and/or diagnosis of SARS-CoV-2 by FDA under an Emergency Use Authorization (EUA).  This EUA will remain in effect (meaning this te st can be used) for the duration of  the COVID-19 declaration under Section 564(b)(1) of the Act, 21 U.S.C. section 360bbb-3(b)(1), unless the authorization is terminated or revoked sooner.     Influenza A by PCR NEGATIVE NEGATIVE Final   Influenza B by PCR NEGATIVE NEGATIVE Final    Comment: (NOTE) The Xpert Xpress SARS-CoV-2/FLU/RSV plus assay is intended as an aid in the diagnosis of influenza from Nasopharyngeal swab specimens and should not be used as a sole basis for treatment. Nasal washings and aspirates are unacceptable for Xpert Xpress SARS-CoV-2/FLU/RSV testing.  Fact Sheet for Patients: EntrepreneurPulse.com.au  Fact Sheet for Healthcare Providers: IncredibleEmployment.be  This test is not yet approved or cleared by the [place] FDA and has been authorized for detection and/or diagnosis of SARS-CoV-2 by FDA under an Emergency Use Authorization (EUA). This EUA will remain in effect (meaning this test can be used) for the duration of the COVID-19 declaration under Section 564(b)(1) of the Act, 21 U.S.C. section 360bbb-3(b)(1), unless the authorization is terminated or revoked.  Performed at [facility], [address] [place], [place] [address]   MRSA PCR Screening     Status: None    Collection Time: 09/08/20 10:14 AM   Specimen: Nasal Mucosa; Nasopharyngeal  Result Value Ref Range Status   MRSA by PCR NEGATIVE NEGATIVE Final    Comment:        The GeneXpert MRSA Assay (FDA approved for NASAL  specimens only), is one component of a comprehensive MRSA colonization surveillance program. It is not intended to diagnose MRSA infection nor to guide or monitor treatment for MRSA infections. Performed at [facility], [address] [place], [place] [address]   Gastrointestinal Panel by PCR , Stool     Status: None   Collection Time: 09/08/20 11:00 PM   Specimen: Stool  Result Value Ref Range Status   Campylobacter species NOT DETECTED NOT DETECTED Final   Plesimonas shigelloides NOT DETECTED NOT DETECTED Final   Salmonella species NOT DETECTED NOT DETECTED Final   Yersinia enterocolitica NOT DETECTED NOT DETECTED Final   Vibrio species NOT DETECTED NOT DETECTED Final   Vibrio cholerae NOT DETECTED NOT DETECTED Final   Enteroaggregative E coli (EAEC) NOT DETECTED NOT DETECTED Final   Enteropathogenic E coli (EPEC) NOT DETECTED NOT DETECTED Final   Enterotoxigenic E coli (ETEC) NOT DETECTED NOT DETECTED Final   Shiga like toxin producing E coli (STEC) NOT DETECTED NOT DETECTED Final   Shigella/Enteroinvasive E coli (EIEC) NOT DETECTED NOT DETECTED Final   Cryptosporidium NOT DETECTED NOT DETECTED Final   Cyclospora cayetanensis NOT DETECTED NOT DETECTED Final   Entamoeba histolytica NOT DETECTED NOT DETECTED Final   Giardia lamblia NOT DETECTED NOT DETECTED Final   Adenovirus F40/41 NOT DETECTED NOT DETECTED Final   Astrovirus NOT DETECTED NOT DETECTED Final   Norovirus GI/GII NOT DETECTED NOT DETECTED Final   Rotavirus A NOT DETECTED NOT DETECTED Final   Sapovirus (I, II, IV, and V) NOT DETECTED NOT DETECTED Final    Comment: Performed at [facility], [address], [place],  [address]         Radiology Studies: CARDIAC  CATHETERIZATION  Result Date: 09/11/2020 Conclusions: 1. Mild to moderate, nonobstructive coronary artery disease.  No critical lesion identified to explain patient's ventricular tachycardia and/or cardiomyopathy. 2. Normal left ventricular filling pressure. Recommendations: 1. Medical therapy and risk factor modification to prevent progression of coronary artery disease. 2. EP consultation for further recommendations regarding management of syncope and ventricular tachycardia. Nelva Bush, MD [facility]        Scheduled Meds: . acidophilus  2 capsule Oral TID  . aspirin  81 mg Oral Daily  . buPROPion  300 mg Oral Daily  . carbidopa-levodopa  3 tablet Oral TID  . cephALEXin  250 mg Oral Q6H  . Chlorhexidine Gluconate Cloth  6 each Topical Q0600  . cyproheptadine  4 mg Oral Daily  . enoxaparin (LOVENOX) injection  40 mg Subcutaneous Q24H  . feeding supplement  237 mL Oral TID BM  . fluticasone  2 spray Each Nare Daily  . melatonin  5 mg Oral QHS  . multivitamin with minerals  1 tablet Oral Daily  . potassium chloride  40 mEq Oral Daily  . sodium chloride flush  3 mL Intravenous Q12H  . sodium chloride flush  3 mL Intravenous Q12H  . sodium chloride flush  3 mL Intravenous Q12H   Continuous Infusions: . sodium chloride       LOS: 4 days    Time spent: 32 minutes    Sharen Hones, MD [facility]   To contact the attending provider between 7A-7P or the covering provider during after hours 7P-7A, please log into the web site [email or web address] and access using universal [place] password for that web site. If you do not have the password, please call the hospital operator.  09/12/2020, 3:08 PM

## 2020-09-13 DIAGNOSIS — E43 Unspecified severe protein-calorie malnutrition: Secondary | ICD-10-CM

## 2020-09-13 LAB — PHOSPHORUS: Phosphorus: 3.9 mg/dL (ref 2.5–4.6)

## 2020-09-13 LAB — BASIC METABOLIC PANEL
Anion gap: 9 (ref 5–15)
BUN: 20 mg/dL (ref 8–23)
CO2: 27 mmol/L (ref 22–32)
Calcium: 8.5 mg/dL — ABNORMAL LOW (ref 8.9–10.3)
Chloride: 100 mmol/L (ref 98–111)
Creatinine, Ser: 0.75 mg/dL (ref 0.61–1.24)
GFR, Estimated: >60 mL/min (ref >60)
Glucose, Bld: 132 mg/dL — ABNORMAL HIGH (ref 70–99)
Potassium: 4.5 mmol/L (ref 3.5–5.1)
Sodium: 136 mmol/L (ref 135–145)

## 2020-09-13 LAB — GLUCOSE, CAPILLARY
Glucose-Capillary: 91 mg/dL (ref 70–99)
Glucose-Capillary: 84 mg/dL (ref 70–99)
Glucose-Capillary: 97 mg/dL (ref 70–99)

## 2020-09-13 LAB — CORTISOL-AM, BLOOD: Cortisol - AM: 8 ug/dL (ref 6.7–22.6)

## 2020-09-13 LAB — MAGNESIUM: Magnesium: 2 mg/dL (ref 1.7–2.4)

## 2020-09-13 MED ORDER — RISAQUAD PO CAPS: 2.0000 | ORAL_CAPSULE | Freq: Three times a day (TID) | ORAL | 0 refills | Status: DC

## 2020-09-13 MED ORDER — CEPHALEXIN 250 MG PO CAPS: 250.0000 mg | ORAL_CAPSULE | Freq: Four times a day (QID) | ORAL | 0 refills | Status: AC

## 2020-09-13 MED ORDER — ASPIRIN 81 MG PO CHEW: 81.0000 mg | CHEWABLE_TABLET | Freq: Every day | ORAL | 0 refills | Status: DC

## 2020-09-13 MED ORDER — POTASSIUM CHLORIDE 20 MEQ PO PACK: 40.0000 meq | PACK | Freq: Every day | ORAL | 0 refills | Status: DC

## 2020-09-13 NOTE — Progress Notes (Signed)
0456-CBG result 55-patient asymptomatic, A & O x4, and VSS at this time. Patient given orange juice, graham crackers, and had an unopened ensure at bedside. Encouraged patient to eat the crackers and drink the juice. Rechecked CBG at 0556 and result was 62. Patient still eating and drinking juice at the time of recheck. Will recheck after patient is done eating and drinking for accurate result.

## 2020-09-13 NOTE — Progress Notes (Signed)
Progress Note  Patient Name: Darren Thomas. Date of Encounter: 09/13/2020  Primary Cardiologist: Agbor-Etang  Subjective   No acute overnight events. Seen by EP without recommendation for ICD, this also does not align with the patient's goals of care.   Inpatient Medications    Scheduled Meds: . acidophilus  2 capsule Oral TID  . aspirin  81 mg Oral Daily  . buPROPion  300 mg Oral Daily  . carbidopa-levodopa  3 tablet Oral TID  . cephALEXin  250 mg Oral Q6H  . Chlorhexidine Gluconate Cloth  6 each Topical Q0600  . cyproheptadine  4 mg Oral Daily  . enoxaparin (LOVENOX) injection  40 mg Subcutaneous Q24H  . feeding supplement  237 mL Oral TID BM  . fluticasone  2 spray Each Nare Daily  . melatonin  5 mg Oral QHS  . multivitamin with minerals  1 tablet Oral Daily  . potassium chloride  40 mEq Oral Daily  . sodium chloride flush  3 mL Intravenous Q12H  . sodium chloride flush  3 mL Intravenous Q12H  . sodium chloride flush  3 mL Intravenous Q12H   Continuous Infusions: . sodium chloride     PRN Meds: sodium chloride, acetaminophen **OR** acetaminophen, alum & mag hydroxide-simeth, bisacodyl, sodium chloride flush   Vital Signs    Vitals:   09/13/20 0400 09/13/20 0424 09/13/20 0600 09/13/20 0644  BP: (!) 105/48   (!) 105/56  Pulse: (!) 56  82 65  Resp: 15  17 (!) 26  Temp: 98.9 F (37.2 C)     TempSrc: Oral     SpO2: 97%  100% 100%  Weight:  71.5 kg    Height:        Intake/Output Summary (Last 24 hours) at 09/13/2020 0754 Last data filed at 09/13/2020 0600 Gross per 24 hour  Intake --  Output 1150 ml  Net -1150 ml   Filed Weights   09/11/20 0500 09/12/20 0451 09/13/20 0424  Weight: 70.3 kg 69.2 kg 71.5 kg    Telemetry    SR - Personally Reviewed  ECG    Sinus bradycardia, 57, incomplete LBBB - Personally Reviewed  Physical Exam   See MD note.  Labs    Chemistry Recent Labs  Lab 09/07/20 2255 09/08/20 0533 09/11/20 0448 09/12/20 0322  09/13/20 0520  NA 137   < > 137 139 136  K 2.6*   < > 4.2 4.2 4.5  CL 98   < > 103 104 100  CO2 29   < > 28 28 27   GLUCOSE 112*   < > 92 80 132*  BUN 19   < > 16 13 20   CREATININE 0.84   < > 0.79 0.72 0.75  CALCIUM 8.4*   < > 8.1* 7.9* 8.5*  PROT 6.0*  --   --   --   --   ALBUMIN 3.3*  --   --   --   --   AST 21  --   --   --   --   ALT 9  --   --   --   --   ALKPHOS 60  --   --   --   --   BILITOT 1.0  --   --   --   --   GFRNONAA >60   < > >60 >60 >60  ANIONGAP 10   < > 6 7 9    < > = values in this  interval not displayed.     Hematology Recent Labs  Lab 09/08/20 0533 09/09/20 0412 09/10/20 0448  WBC 8.7 6.8 7.4  RBC 3.98* 3.87* 4.08*  HGB 11.7* 11.5* 12.2*  HCT 33.3* 33.1* 35.3*  MCV 83.7 85.5 86.5  MCH 29.4 29.7 29.9  MCHC 35.1 34.7 34.6  RDW 14.1 14.6 15.1  PLT 217 187 184    Cardiac EnzymesNo results for input(s): TROPONINI in the last 168 hours. No results for input(s): TROPIPOC in the last 168 hours.   BNPNo results for input(s): BNP, PROBNP in the last 168 hours.   DDimer No results for input(s): DDIMER in the last 168 hours.   Radiology    CXR 09/08/2020: IMPRESSION: 1. Mild bibasilar linear subsegmental atelectasis and/or scarring. 2. No other active cardiopulmonary disease. 3.  Aortic Atherosclerosis (ICD10-I70.0).  Cardiac Studies   2D echo 09/09/2020: 1. Left ventricular ejection fraction, by estimation, is 45 to 50%. The  left ventricle has mildly decreased function. The left ventricle has no  regional wall motion abnormalities. Left ventricular diastolic parameters  were normal.  2. Right ventricular systolic function is normal. The right ventricular  size is normal. There is normal pulmonary artery systolic pressure.  3. The mitral valve is grossly normal. Trivial mitral valve  regurgitation.  4. The aortic valve is grossly normal. Aortic valve regurgitation is not  visualized. Mild aortic valve sclerosis is present, with no evidence of   aortic valve stenosis.  5. The inferior vena cava is normal in size with greater than 50%  respiratory variability, suggesting right atrial pressure of 3 mmHg. __________  LHC 09/11/2020: Conclusions: 1. Mild to moderate, nonobstructive coronary artery disease.  No critical lesion identified to explain patient's ventricular tachycardia and/or cardiomyopathy. 2. Normal left ventricular filling pressure.  Recommendations: 1. Medical therapy and risk factor modification to prevent progression of coronary artery disease. 2. EP consultation for further recommendations regarding management of syncope and ventricular tachycardia.  Patient Profile     75 y.o. male with history of Parkinson's disease, lung cancer, depression, PTSD and recent COVID infection 3 weeks ago who is being seen 09/12/2020 for the evaluation of polymorphic ventricular tachycardia.  Assessment & Plan    1. Polymorphic VT: -No further ectopy on telemetry following electrolyte repletion and discontinuation of loperamide  -Evaluated by EP 09/12/2020 without recommendation to proceed with ICD, patient also has declined ICD as this does not align with his goals of care in the context of his underlying malignancy  -EP recommends no AAT -No beta blocker with underlying bradycardic heart rates -Magnesium and potassium at goal -Avoid QT prolonging medications -Please ensure pharmacy has been consulted to provide the patient with a list of medications to avoid prior to discharge   2. NICM: -EF mildly reduced by echo as outlined above -LHC this admission with mild to moderate nonobstructive CAD -Not on beta blocker at this time in the context of underlying bradycardic heart rates -Not currently on ACEi/ARB/ARNI/MRA secondary to intermittent hypotension   3. Nonobstructive CAD: -As outlined above -HS-Tn normal this admission  -ASA -LDL 69  4. COVID infection: -Incidentally noted -Per IM  5. [facility]: [facility]   For questions or updates, please contact [facility] Please consult [email or web address] for contact info under Cardiology/STEMI.    Signed, Christell Faith, PA-C [place] Pager: [phone] 09/13/2020, 7:54 AM

## 2020-09-13 NOTE — Consult Note (Signed)
[place] for Electrolyte Monitoring and Replacement   Recent Labs: Potassium (mmol/L)  Date Value  09/13/2020 4.5   Magnesium (mg/dL)  Date Value  09/13/2020 2.0   Calcium (mg/dL)  Date Value  09/13/2020 8.5 (L)   Albumin (g/dL)  Date Value  09/07/2020 3.3 (L)   Phosphorus (mg/dL)  Date Value  09/13/2020 3.9   Sodium (mmol/L)  Date Value  09/13/2020 136  05/14/2016 141   Assessment: Patient is a 75 y/o M with medical history including Parkinson's disease, lung cancer, depression / PTSD, recent SARS-CoV-2 infection who presented to the ED for evaluation of syncope. Noted to have multiple episodes of NSVT in the ED. QTc prolonged (amio stopped) . Worsened on amiodarone and was changed to lidocaine drip. Pharmacy consulted to assist with electrolyte monitoring and replacement as indicated.  Patient also with NVD. Started on loperamide. KCl 49mEq PO daily   Goal of Therapy:  K > 4 Mg > 2 All other electrolytes within normal limits  Plan:  -- Electrolytes WNL, no replacement needed at this time -- Follow up electrolytes with AM labs  Sherilyn Banker, PharmD Pharmacy Resident  09/13/2020 6:20 AM

## 2020-09-13 NOTE — Progress Notes (Signed)
Patient to discharge home today. Discharge education completed with both patient and his wife, Derl Barrow, to include medication review and schedule for follow up appts, activity resumption, and diet recommendations. Provided additional handouts for adult dehydration and syncope. Reviewed causes, s/s, risks, how to prevent, and when to see medical care. Follow up appts reviewed, the patient nor his wife have any questions or concerns at this time. PIVs removed; patient disconnected from bedside monitor. Patient dressed and transported downstairs via wheelchair.

## 2020-09-13 NOTE — Discharge Instructions (Signed)
Dehydration, Adult Dehydration is a condition in which there is not enough water or other fluids in the body. This happens when a person loses more fluids than he or she takes in. Important organs, such as the kidneys, brain, and heart, cannot function without a proper amount of fluids. Any loss of fluids from the body can lead to dehydration. Dehydration can be mild, moderate, or severe. It should be treated right away to prevent it from becoming severe. What are the causes? Dehydration may be caused by:  Conditions that cause loss of water or other fluids, such as diarrhea, vomiting, or sweating or urinating a lot.  Not drinking enough fluids, especially when you are ill or doing activities that require a lot of energy.  Other illnesses and conditions, such as fever or infection.  Certain medicines, such as medicines that remove excess fluid from the body (diuretics).  Lack of safe drinking water.  Not being able to get enough water and food. What increases the risk? The following factors may make you more likely to develop this condition:  Having a long-term (chronic) illness that has not been treated properly, such as diabetes, heart disease, or kidney disease.  Being 77 years of age or older.  Having a disability.  Living in a place that is high in altitude, where thinner, drier air causes more fluid loss.  Doing exercises that put stress on your body for a long time (endurance sports). What are the signs or symptoms? Symptoms of dehydration depend on how severe it is. Mild or moderate dehydration  Thirst.  Dry lips or dry mouth.  Dizziness or light-headedness, especially when standing up from a seated position.  Muscle cramps.  Dark urine. Urine may be the color of tea.  Less urine or tears produced than usual.  Headache. Severe dehydration  Changes in skin. Your skin may be cold and clammy, blotchy, or pale. Your skin also may not return to normal after being  lightly pinched and released.  Little or no tears, urine, or sweat.  Changes in vital signs, such as rapid breathing and low blood pressure. Your pulse may be weak or may be faster than 100 beats a minute when you are sitting still.  Other changes, such as: ? Feeling very thirsty. ? Sunken eyes. ? Cold hands and feet. ? Confusion. ? Being very tired (lethargic) or having trouble waking from sleep. ? Short-term weight loss. ? Loss of consciousness. How is this diagnosed? This condition is diagnosed based on your symptoms and a physical exam. You may have blood and urine tests to help confirm the diagnosis. How is this treated? Treatment for this condition depends on how severe it is. Treatment should be started right away. Do not wait until dehydration becomes severe. Severe dehydration is an emergency and needs to be treated in a hospital.  Mild or moderate dehydration can be treated at home. You may be asked to: ? Drink more fluids. ? Drink an oral rehydration solution (ORS). This drink helps restore proper amounts of fluids and salts and minerals in the blood (electrolytes).  Severe dehydration can be treated: ? With IV fluids. ? By correcting abnormal levels of electrolytes. This is often done by giving electrolytes through a tube that is passed through your nose and into your stomach (nasogastric tube, or NG tube). ? By treating the underlying cause of dehydration. Follow these instructions at home: Oral rehydration solution If told by your health care provider, drink an ORS:  Make  an ORS by following instructions on the package.  Start by drinking small amounts, about  cup (120 mL) every 5-10 minutes.  Slowly increase how much you drink until you have taken the amount recommended by your health care provider. Eating and drinking  Drink enough clear fluid to keep your urine pale yellow. If you were told to drink an ORS, finish the ORS first and then start slowly drinking  other clear fluids. Drink fluids such as: ? Water. Do not drink only water. Doing that can lead to hyponatremia, which is having too little salt (sodium) in the body. ? Water from ice chips you suck on. ? Fruit juice that you have added water to (diluted fruit juice). ? Low-calorie sports drinks.  Eat foods that contain a healthy balance of electrolytes, such as bananas, oranges, potatoes, tomatoes, and spinach.  Do not drink alcohol.  Avoid the following: ? Drinks that contain a lot of sugar. These include high-calorie sports drinks, fruit juice that is not diluted, and soda. ? Caffeine. ? Foods that are greasy or contain a lot of fat or sugar.         General instructions  Take over-the-counter and prescription medicines only as told by your health care provider.  Do not take sodium tablets. Doing that can lead to having too much sodium in the body (hypernatremia).  Return to your normal activities as told by your health care provider. Ask your health care provider what activities are safe for you.  Keep all follow-up visits as told by your health care provider. This is important. Contact a health care provider if:  You have muscle cramps, pain, or discomfort, such as: ? Pain in your abdomen and the pain gets worse or stays in one area (localizes). ? Stiff neck.  You have a rash.  You are more irritable than usual.  You are sleepier or have a harder time waking than usual.  You feel weak or dizzy.  You feel very thirsty. Get help right away if you have:  Any symptoms of severe dehydration.  Symptoms of vomiting, such as: ? You cannot eat or drink without vomiting. ? Vomiting gets worse or does not go away. ? Vomit includes blood or green matter (bile).  Symptoms that get worse with treatment.  A fever.  A severe headache.  Problems with urination or bowel movements, such as: ? Diarrhea that gets worse or does not go away. ? Blood in your stool (feces).  This may cause stool to look black and tarry. ? Not urinating, or urinating only a small amount of very dark urine, within 6-8 hours.  Trouble breathing. These symptoms may represent a serious problem that is an emergency. Do not wait to see if the symptoms will go away. Get medical help right away. Call your local emergency services (911 in the [place] Do not drive yourself to the hospital. Summary  Dehydration is a condition in which there is not enough water or other fluids in the body. This happens when a person loses more fluids than he or she takes in.  Treatment for this condition depends on how severe it is. Treatment should be started right away. Do not wait until dehydration becomes severe.  Drink enough clear fluid to keep your urine pale yellow. If you were told to drink an oral rehydration solution (ORS), finish the ORS first and then start slowly drinking other clear fluids.  Take over-the-counter and prescription medicines only as told by your health  care provider.  Get help right away if you have any symptoms of severe dehydration. This information is not intended to replace advice given to you by your health care provider. Make sure you discuss any questions you have with your health care provider. Document Revised: 12/09/2018 Document Reviewed: 12/09/2018 Elsevier Patient Education  2021 [employer].   Near-Syncope Near-syncope is when you suddenly get weak or dizzy, or you feel like you might pass out (faint). This may also be called presyncope. This is due to a lack of blood flow to the brain. During an episode of near-syncope, you may:  Feel dizzy, weak, or light-headed.  Feel sick to your stomach (nauseous).  See all white or all black.  See spots.  Have cold, clammy skin. This condition is caused by a sudden decrease in blood flow to the brain. This decrease can result from various causes, but most of those causes are not dangerous. However, near-syncope may  be a sign of a serious medical problem, so it is important to seek medical care. Follow these instructions at home: Medicines  Take over-the-counter and prescription medicines only as told by your doctor.  If you are taking blood pressure or heart medicine, get up slowly and spend many minutes getting ready to sit and then stand. This can help with dizziness. General instructions  Be aware of any changes in your symptoms.  Talk with your doctor about your symptoms. You may need to have testing to find the cause of your near-syncope.  If you start to feel like you might pass out, lie down right away. Raise (elevate) your feet above the level of your heart. Breathe deeply and steadily. Wait until all of the symptoms are gone.  Have someone stay with you until you feel stable.  Do not drive, use machinery, or play sports until your doctor says it is okay.  Drink enough fluid to keep your pee (urine) pale yellow.  Keep all follow-up visits as told by your doctor. This is important. Get help right away if you:  Have a seizure.  Have pain in your: ? Chest. ? Belly (abdomen). ? Back.  Faint once or more than once.  Have a very bad headache.  Are bleeding from your mouth or butt.  Have black or tarry poop (stool).  Have a very fast or uneven heartbeat (palpitations).  Are mixed up (confused).  Have trouble walking.  Are very weak.  Have trouble seeing. These symptoms may be an emergency. Do not wait to see if the symptoms will go away. Get medical help right away. Call your local emergency services (911 in the [place] Do not drive yourself to the hospital. Summary  Near-syncope is when you suddenly get weak or dizzy, or you feel like you might pass out (faint).  This condition is caused by a lack of blood flow to the brain.  Near-syncope may be a sign of a serious medical problem, so it is important to seek medical care. This information is not intended to replace  advice given to you by your health care provider. Make sure you discuss any questions you have with your health care provider. Document Revised: 08/20/2018 Document Reviewed: 03/17/2018 Elsevier Patient Education  2021 [employer].

## 2020-09-13 NOTE — Plan of Care (Signed)

## 2020-09-13 NOTE — Discharge Summary (Addendum)
Physician Discharge Summary  Patient ID: Darren Thomas. MRN: [id number] DOB/AGE: Feb 10, 1946 75 y.o.  Admit date: 09/07/2020 Discharge date: 09/13/2020  Admission Diagnoses:  Discharge Diagnoses:  Active Problems:   Ventricular tachycardia (HCC)   Hypokalemia   Syncope   Protein-calorie malnutrition, severe   Nonischemic cardiomyopathy ([place])   Discharged Condition: fair  Hospital Course:  PaulTurneris a 75 y.o.malewith a known history of Parkinson's, lung C, depression/PTSD, COVID 3 weeks agopresents to the emergency department for evaluation of syncope. Patient was in a usual state of health until recent COVID infection, he has not fully recovered his appetite, and has been having N, V/D. While in the ED,l he had a 3 episodes of nonsustained VT associated with dizziness and lightheadedness.  He is a seen by cardiology and electrophysiology, VT was thought to be due to electrolytes abnormality including hypokalemia.  Patient also was taking Imodium for diarrhea.  Decision was made against AICD.   #1. Syncope secondary to VT.  Nonsustained ventricular tachycardia secondary to hypokalemia and  Hypokalemia  Hypophosphatemia.  Prolonged QT  Chronic systolic congestive heart failure. Patient had echocardiogram showed ejection fraction 45 to 50%, left heart cath did not show significant occlusion. Patient has been seen by cardiology as well as electrophysiologist, ventricular tachycardia were thought to be due to abnormal electrolytes as well as medicine such as Imodium.  Decision was made not to place AICD.  Also need to keep with potassium above 4.0 and magnesium 2.0.  Potassium at 40 mEq daily is prescribed. Patient also has chronic diarrhea, he also has weight loss.  Patient need to follow-up with GI as outpatient to perform EGD/colonoscopy.  Avoid Imodium. Above advice has been discussed with patient son.   #2. Nausea vomiting diarrhea.  Avoid Imodium.  Patient condition  seem to be better, appetite improving.  Diarrhea also slowing down.  Patient be followed by Dr. Vicente Thomas as outpatient.  3. Klebsiella urinary tract infection.  Will finish antibiotics with Keflex.  #4 COVID infection.  Asymptomatic, no hypoxemia.   5. Lungs CA.  Follow-up with oncology in [place] as outpatient.   #6. Hypoglycemia.  Patient does not have diabetes. Will follow. Check a.m. cortisol level. 09/14/2020.  Addendum: cortisol 8.0  Consults: cardiology  Significant Diagnostic Studies:  Heart cath: Conclusions: 1. Mild to moderate, nonobstructive coronary artery disease.  No critical lesion identified to explain patient's ventricular tachycardia and/or cardiomyopathy. 2. Normal left ventricular filling pressure.  Recommendations: 1. Medical therapy and risk factor modification to prevent progression of coronary artery disease. 2. EP consultation for further recommendations regarding management of syncope and ventricular tachycardia.  Darren Bush, MD [facility]  Echo:  1. Left ventricular ejection fraction, by estimation, is 45 to 50%. The left ventricle has mildly decreased function. The left ventricle has no regional wall motion abnormalities. Left ventricular diastolic parameters were normal. 2. Right ventricular systolic function is normal. The right ventricular size is normal. There is normal pulmonary artery systolic pressure. 3. The mitral valve is grossly normal. Trivial mitral valve regurgitation. 4. The aortic valve is grossly normal. Aortic valve regurgitation is not visualized. Mild aortic valve sclerosis is present, with no evidence of aortic valve stenosis. 5. The inferior vena cava is normal in size with greater than 50% respiratory variability, suggesting right atrial pressure of 3 mmHg.   CHEST  1 VIEW  COMPARISON:  Prior radiograph from 06/15/2019.  FINDINGS: Cardiac and mediastinal silhouettes are stable, and remain within normal limits.  Aortic atherosclerosis.  Lungs  normally inflated. Scattered linear subsegmental atelectasis and/or scarring present at the bilateral lung bases. No focal infiltrates. No edema or effusion. No pneumothorax.  Different related pads overlie the left lower chest. No acute osseous finding. Postsurgical changes partially visualize within the cervical spine.  IMPRESSION: 1. Mild bibasilar linear subsegmental atelectasis and/or scarring. 2. No other active cardiopulmonary disease. 3.  Aortic Atherosclerosis (ICD10-I70.0).   Electronically Signed   By: Darren Thomas M.D.   On: 09/08/2020 03:13   Treatments: Cardiac monitoring  Discharge Exam: Blood pressure (!) 105/56, pulse 65, temperature 98.9 F (37.2 C), temperature source Oral, resp. rate (!) 26, height 6\' 2"  (1.88 m), weight 71.5 kg, SpO2 100 %. General appearance: alert and cooperative Resp: clear to auscultation bilaterally Cardio: regular rate and rhythm, S1, S2 normal, no murmur, click, rub or gallop GI: soft, non-tender; bowel sounds normal; no masses,  no organomegaly Extremities: extremities normal, atraumatic, no cyanosis or edema  Disposition: Discharge disposition: 01-Home or Self Care       Discharge Instructions    Diet - low sodium heart healthy   Complete by: As directed    Discharge wound care:   Complete by: As directed    Follow with PCP   Increase activity slowly   Complete by: As directed      Allergies as of 09/13/2020      Reactions   Augmentin [amoxicillin-pot Clavulanate] Diarrhea   Duloxetine    Other reaction(s): Chest pain      Medication List    STOP taking these medications   donepezil 10 MG tablet Commonly known as: ARICEPT   escitalopram 20 MG tablet Commonly known as: LEXAPRO   lamoTRIgine 100 MG tablet Commonly known as: LAMICTAL   ondansetron 8 MG tablet Commonly known as: ZOFRAN   polyethylene glycol 17 g packet Commonly known as: MIRALAX / GLYCOLAX    traZODone 100 MG tablet Commonly known as: DESYREL     TAKE these medications   acidophilus Caps capsule Take 2 capsules by mouth 3 (three) times daily.   aspirin 81 MG chewable tablet Chew 1 tablet (81 mg total) by mouth daily. Start taking on: Sep 14, 2020   buPROPion 300 MG 24 hr tablet Commonly known as: WELLBUTRIN XL Take 150 mg by mouth daily.   carbidopa-levodopa 25-250 MG tablet Commonly known as: SINEMET IR Take 2 tablets by mouth 3 (three) times daily.   cephALEXin 250 MG capsule Commonly known as: KEFLEX Take 1 capsule (250 mg total) by mouth every 6 (six) hours for 2 days.   PRESERVISION AREDS 2 PO Take 1 tablet by mouth 2 (two) times daily.   COMPLETE MULTIVITAMIN/MINERAL PO Take 1 tablet by mouth daily.   cyproheptadine 4 MG tablet Commonly known as: PERIACTIN Take 4 mg by mouth daily.   diclofenac Sodium 1 % Gel Commonly known as: VOLTAREN Apply 1 application topically daily as needed.   fluticasone 50 MCG/ACT nasal spray Commonly known as: FLONASE USE 2 SPRAYS IN EACH NOSTRIL EVERY DAY What changed:   how much to take  when to take this   Keytruda 100 MG/4ML Soln Generic drug: pembrolizumab Inject 2 mg/kg into the vein. Immunotherapy treatments every 6 weeks at [facility]   lidocaine 2 % solution Commonly known as: XYLOCAINE Use as directed 15 mLs in the mouth or throat as needed for mouth pain.   loratadine 10 MG tablet Commonly known as: CLARITIN Take 10 mg by mouth daily as needed.   melatonin 5 MG  Tabs Take 5 mg by mouth at bedtime.   potassium chloride 20 MEQ packet Commonly known as: KLOR-CON Take 40 mEq by mouth daily. Start taking on: Sep 14, 2020   senna 8.6 MG tablet Commonly known as: SENOKOT Take 1 tablet by mouth daily as needed.   sodium chloride 5 % ophthalmic solution Commonly known as: MURO 128 Place 1 drop into both eyes daily.   tamsulosin 0.4 MG Caps capsule Commonly known as: FLOMAX Take 1 capsule by  mouth every other day.   triamcinolone cream 0.1 % Commonly known as: KENALOG Apply 1 application topically daily as needed.            Discharge Care Instructions  (From admission, onward)         Start     Ordered   09/13/20 0000  Discharge wound care:       Comments: Follow with PCP   09/13/20 1033          Follow-up Information    Steele Sizer, MD Follow up in 1 week(s).   Specialty: Family Medicine Contact information: [address] [place] [address] [phone]        Darren Bush, MD Follow up in 2 week(s).   Specialty: Cardiology Contact information: [place] [address] [phone]        Jonathon Bellows, MD Follow up in 2 week(s).   Specialty: Gastroenterology Contact information: [place] [address] [phone]              35 minutes Signed: Sharen Hones 09/13/2020, 10:33 AM

## 2020-09-14 LAB — GLUCOSE, CAPILLARY: Glucose-Capillary: 17 mg/dL — CL (ref 70–99)

## 2020-09-17 NOTE — Progress Notes (Signed)
Name: Darren Thomas.   MRN: [id number]    DOB: 1946-03-18   Date:09/18/2020       Progress Note  Subjective  [facility] Follow Up  HPI  Admitted: 09/07/20 Discharged: 09/13/20   Discharge diagnosis:  Ventricular tachycardia Hypokalemia Syncope Protein-calorie malnutrition Nonischemic cardiomyopathy UTI  He was diagnosed with COVID at home on 04/17 when wife developed symptoms, he was having decrease in appetite and nausea prior to her diagnosis. He was not eating much prior to going to [facility], his appetite was very poor , nausea. His wife states episodes of constipation and diarrhea and takes Imodium and sometimes Senna but that has been going on for a while.  He went to [facility] due to syncopal episode ( wife states his eye rolled on the back of his head, his head was shaking so wife called 25 ) when he woke up he could not recall what happened, he was not confused and did not have bowel incontinence, but wears depend so unsure if bladder incontinence. Upon arrival to [facility] he had  dizziness and lightheadedness associated with  sustained VT.  Cardiologist was consulted and thought to be secondary to electrolyte abnormality He has been eating better since he went home, appetite has improve, no more episodes of feeling hot like before   Echo showed EF 45-50 % Left cardiac cath showed mild to moderate non obstructive coronary artery disease, no critical lesion identified to explained VT or cardiomyopathy   He was sent home with recommendation to have electrolytes monitored, avoid Imodium, keep potassium at 4 and magnesium at 2 and follow up with GI  UTI: treated with antibiotics, he finished since discharge   [facility]   Senile purpura: both arms   Medication reconciliation done   Patient Active Problem List   Diagnosis Date Noted  . Adjustment disorder with mixed anxiety and depressed mood 09/18/2020  . Age-related nuclear cataract, bilateral  09/18/2020  . Carpal tunnel syndrome 09/18/2020  . Benign prostatic hyperplasia with lower urinary tract symptoms 09/18/2020  . Cervical disc disorder with radiculopathy, unspecified cervical region 09/18/2020  . Encounter for palliative care 09/18/2020  . Encounter for antineoplastic immunotherapy 09/18/2020  . Neoplastic (malignant) related fatigue 09/18/2020  . Secondary malignant neoplasm of intra-abdominal lymph nodes ([place]) 09/18/2020  . Protein-calorie malnutrition, severe 09/10/2020  . Nonischemic cardiomyopathy ([place])   . Ventricular tachycardia ([place]) 09/08/2020  . Erectile dysfunction 09/08/2020  . Malignant neoplasm of upper lobe, unspecified bronchus or lung ([place]) 09/08/2020  . Hypokalemia   . Syncope   . Parkinson disease ([place]) 11/19/2018  . Oral phase dysphagia 11/19/2018  . Skin ulcer of right foot, limited to breakdown of skin ([place]) 05/21/2018  . Thrombocytopenia ([place]) 05/21/2018  . Squamous cell carcinoma of left lower leg 01/27/2018  . Major depression, recurrent, chronic ([place]) 10/06/2017  . Hammer toe, acquired 06/19/2017  . Chronic constipation 11/07/2016  . Insomnia 06/20/2016  . PTSD (post-traumatic stress disorder) 06/20/2016  . Perennial allergic rhinitis with seasonal variation 02/05/2016  . Cervical spondylosis with myelopathy 01/06/2014    Past Surgical History:  Procedure Laterality Date  . C-spine     C-3-7 removed  . LEFT HEART CATH AND CORONARY ANGIOGRAPHY N/A 09/11/2020   Procedure: LEFT HEART CATH AND CORONARY ANGIOGRAPHY;  Surgeon: Nelva Bush, MD;  Location: [facility];  Service: Cardiovascular;  Laterality: N/A;  . SCALP LESION REMOVAL W/ FLAP AND SKIN GRAFT    .  SKIN CANCER EXCISION Left    squamos cell cancinoma    Family History  Problem Relation Age of Onset  . Cervical cancer Mother   . Heart Problems Father   . Diabetes Father   . Asthma Brother   . Emphysema Brother        was a smoker  . Diabetes type I Son   .  Colon cancer Son 2    Social History   Tobacco Use  . Smoking status: Former Smoker    Packs/day: 1.00    Years: 20.00    Pack years: 20.00    Types: Cigarettes    Quit date: 1973    Years since quitting: 49.3  . Smokeless tobacco: Never Used  . Tobacco comment: smoking cessation materials not required  Substance Use Topics  . Alcohol use: No     Current Outpatient Medications:  .  aspirin 81 MG chewable tablet, Chew 1 tablet (81 mg total) by mouth daily., Disp: 30 tablet, Rfl: 0 .  buPROPion (WELLBUTRIN XL) 150 MG 24 hr tablet, Take 150 mg by mouth daily., Disp: , Rfl:  .  carbidopa-levodopa (SINEMET IR) 25-250 MG tablet, Take 2 tablets by mouth 3 (three) times daily., Disp: , Rfl:  .  diclofenac Sodium (VOLTAREN) 1 % GEL, Apply 1 application topically daily as needed., Disp: , Rfl:  .  fluticasone (FLONASE) 50 MCG/ACT nasal spray, USE 2 SPRAYS IN EACH NOSTRIL EVERY DAY (Patient taking differently: Place 1 spray into both nostrils 2 (two) times daily.), Disp: 48 g, Rfl: 0 .  loratadine (CLARITIN) 10 MG tablet, Take 10 mg by mouth daily as needed., Disp: , Rfl:  .  melatonin 5 MG TABS, Take 5 mg by mouth at bedtime., Disp: , Rfl:  .  Multiple Vitamins-Minerals (COMPLETE MULTIVITAMIN/MINERAL PO), Take 1 tablet by mouth daily., Disp: , Rfl:  .  Multiple Vitamins-Minerals (PRESERVISION AREDS 2 PO), Take 1 tablet by mouth 2 (two) times daily., Disp: , Rfl:  .  pembrolizumab (KEYTRUDA) 100 MG/4ML SOLN, Inject 2 mg/kg into the vein. Immunotherapy treatments every 6 weeks at [facility], Disp: , Rfl:  .  potassium chloride (KLOR-CON) 20 MEQ packet, Take 40 mEq by mouth daily., Disp: 60 packet, Rfl: 0 .  senna (SENOKOT) 8.6 MG tablet, Take 1 tablet by mouth daily as needed., Disp: , Rfl:  .  sodium chloride (MURO 128) 5 % ophthalmic solution, Place 1 drop into both eyes daily., Disp: , Rfl:  .  tamsulosin (FLOMAX) 0.4 MG CAPS capsule, Take 1 capsule by mouth every other day., Disp: , Rfl:   .  triamcinolone cream (KENALOG) 0.1 %, Apply 1 application topically daily as needed., Disp: , Rfl:   Allergies  Allergen Reactions  . Augmentin [Amoxicillin-Pot Clavulanate] Diarrhea  . Duloxetine     Other reaction(s): Chest pain    I personally reviewed active problem list, medication list, allergies, family history, social history, health maintenance with the patient/caregiver today.   ROS  Constitutional: Negative for fever , positive for  weight change.  Respiratory: Negative for cough and shortness of breath.   Cardiovascular: Negative for chest pain or palpitations.  Gastrointestinal: Negative for abdominal pain, no bowel changes.  Musculoskeletal: Negative for gait problem or joint swelling.  Skin: Negative for rash.  Neurological: Negative for dizziness or headache.  No other specific complaints in a complete review of systems (except as listed in HPI above).  Objective  Vitals:   09/18/20 1424  BP: 110/62  Pulse:  60  Resp: 18  Temp: 97.7 F (36.5 C)  SpO2: 99%  Weight: 153 lb 3.2 oz (69.5 kg)  Height: 6\' 1"  (1.854 m)    Body mass index is 20.21 kg/m.  Physical Exam  Constitutional: Patient appears well-developed and cachetic .  No distress.  HEENT: head atraumatic, normocephalic, pupils equal and reactive to light,  neck supple Cardiovascular: Normal rate, regular rhythm and normal heart sounds.  No murmur heard. No BLE edema. Pulmonary/Chest: Effort normal and breath sounds normal. No respiratory distress. Abdominal: Soft.  There is no tenderness. Skin; senile purpura on both arms Psychiatric: Patient has a normal mood and affect. behavior is normal. Judgment and thought content normal.  Recent Results (from the past 2160 hour(s))  CBC     Status: Abnormal   Collection Time: 09/07/20 10:55 PM  Result Value Ref Range   WBC 9.4 4.0 - 10.5 K/uL   RBC 4.59 4.22 - 5.81 MIL/uL   Hemoglobin 13.4 13.0 - 17.0 g/dL   HCT 37.7 (L) 39.0 - 52.0 %   MCV 82.1  80.0 - 100.0 fL   MCH 29.2 26.0 - 34.0 pg   MCHC 35.5 30.0 - 36.0 g/dL   RDW 14.1 11.5 - 15.5 %   Platelets 284 150 - 400 K/uL   nRBC 0.0 0.0 - 0.2 %    Comment: Performed at [facility], [address] [place], [place] [address]  Comprehensive metabolic panel     Status: Abnormal   Collection Time: 09/07/20 10:55 PM  Result Value Ref Range   Sodium 137 135 - 145 mmol/L   Potassium 2.6 (LL) 3.5 - 5.1 mmol/L    Comment: CRITICAL RESULT CALLED TO, READ BACK BY AND VERIFIED WITH  KEVIN PADUCHOWSKI,MD  09/07/20 2350 ADL    Chloride 98 98 - 111 mmol/L   CO2 29 22 - 32 mmol/L   Glucose, Bld 112 (H) 70 - 99 mg/dL    Comment: Glucose reference range applies only to samples taken after fasting for at least 8 hours.   BUN 19 8 - 23 mg/dL   Creatinine, Ser 0.84 0.61 - 1.24 mg/dL   Calcium 8.4 (L) 8.9 - 10.3 mg/dL   Total Protein 6.0 (L) 6.5 - 8.1 g/dL   Albumin 3.3 (L) 3.5 - 5.0 g/dL   AST 21 15 - 41 U/L   ALT 9 0 - 44 U/L   Alkaline Phosphatase 60 38 - 126 U/L   Total Bilirubin 1.0 0.3 - 1.2 mg/dL   GFR, Estimated >60 >60 mL/min    Comment: (NOTE) Calculated using the CKD-EPI Creatinine Equation (2021)    Anion gap 10 5 - 15    Comment: Performed at [facility], [address] [place], [place] [address]  Magnesium     Status: None   Collection Time: 09/07/20 10:55 PM  Result Value Ref Range   Magnesium 1.9 1.7 - 2.4 mg/dL    Comment: Performed at [facility], [place], [place] [address]  Troponin I (High Sensitivity)     Status: None   Collection Time: 09/07/20 10:55 PM  Result Value Ref Range   Troponin I (High Sensitivity) 7 <18 ng/L    Comment: (NOTE) Elevated high sensitivity troponin I (hsTnI) values and significant  changes across serial measurements may suggest ACS but many other  chronic and acute conditions are known to elevate hsTnI results.  Refer to the "Links" section for chest pain algorithms and additional  guidance. Performed at [facility], [address] [place], [place] [address]   Urinalysis, Complete w Microscopic     Status: Abnormal   Collection Time: 09/07/20 10:55 PM  Result Value Ref Range   Color, Urine AMBER (A) YELLOW    Comment: BIOCHEMICALS MAY BE AFFECTED BY COLOR   APPearance CLOUDY (A) CLEAR   Specific Gravity, Urine 1.024 1.005 - 1.030   pH 6.0 5.0 - 8.0   Glucose, UA NEGATIVE NEGATIVE mg/dL   Hgb urine dipstick NEGATIVE NEGATIVE   Bilirubin Urine NEGATIVE NEGATIVE   Ketones, ur 80 (A) NEGATIVE mg/dL   Protein, ur 30 (A) NEGATIVE mg/dL   Nitrite POSITIVE (A) NEGATIVE   Leukocytes,Ua SMALL (A) NEGATIVE   RBC / HPF 6-10 0 - 5 RBC/hpf   WBC, UA >50 (H) 0 - 5 WBC/hpf   Bacteria, UA MANY (A) NONE SEEN   Squamous Epithelial / LPF NONE SEEN 0 - 5   Mucus PRESENT    Hyaline Casts, UA PRESENT    Ca Oxalate Crys, UA PRESENT     Comment: Performed at [facility], [address] [place], [place] [address]  CK     Status: None   Collection Time: 09/07/20 10:55 PM  Result Value Ref Range   Total CK 166 49 - 397 U/L    Comment: Performed at [facility], [address] [place], [place] [address]  Urine Culture     Status: Abnormal   Collection Time: 09/07/20 10:55 PM   Specimen: Urine, Random  Result Value Ref Range   Specimen Description      URINE, RANDOM Performed at [facility], [address] [place],  [address]    Special Requests      Normal Performed at [facility], [address] [place], [place] [address]    Culture >=100,000 COLONIES/mL KLEBSIELLA PNEUMONIAE (A)    Report Status 09/10/2020 FINAL    Organism ID, Bacteria KLEBSIELLA PNEUMONIAE (A)       Susceptibility   Klebsiella pneumoniae - MIC*    AMPICILLIN RESISTANT Resistant     CEFAZOLIN <=4 SENSITIVE Sensitive     CEFEPIME <=0.12 SENSITIVE Sensitive     CEFTRIAXONE <=0.25 SENSITIVE Sensitive     CIPROFLOXACIN <=0.25 SENSITIVE  Sensitive     GENTAMICIN <=1 SENSITIVE Sensitive     IMIPENEM <=0.25 SENSITIVE Sensitive     NITROFURANTOIN 64 INTERMEDIATE Intermediate     TRIMETH/SULFA <=20 SENSITIVE Sensitive     AMPICILLIN/SULBACTAM <=2 SENSITIVE Sensitive     PIP/TAZO <=4 SENSITIVE Sensitive     * >=100,000 COLONIES/mL KLEBSIELLA PNEUMONIAE  Resp Panel by RT-PCR (Flu A&B, Covid) Nasopharyngeal Swab     Status: Abnormal   Collection Time: 09/08/20 12:58 AM   Specimen: Nasopharyngeal Swab; Nasopharyngeal(NP) swabs in vial transport medium  Result Value Ref Range   SARS Coronavirus 2 by RT PCR POSITIVE (A) NEGATIVE    Comment: CRITICAL RESULT CALLED TO, READ BACK BY AND VERIFIED WITH: JUAN HENDERSON AT 0156 09/08/20 MF (NOTE) SARS-CoV-2 target nucleic acids are DETECTED.  The SARS-CoV-2 RNA is generally detectable in upper respiratory specimens during the acute phase of infection. Positive results are indicative of the presence of the identified virus, but do not rule out bacterial infection or co-infection with other pathogens not detected by the test. Clinical correlation with patient history and other diagnostic information is necessary to determine patient infection status. The expected result is Negative.  Fact Sheet for Patients: EntrepreneurPulse.com.au  Fact Sheet for Healthcare Providers: IncredibleEmployment.be  This test is not yet approved or cleared by the [place] FDA and  has been authorized for detection and/or diagnosis of SARS-CoV-2 by FDA under an Emergency Use Authorization (EUA).  This EUA will remain in effect (meaning this te st can be used) for the duration of  the COVID-19 declaration under Section 564(b)(1) of the Act, 21 U.S.C. section 360bbb-3(b)(1), unless the authorization is terminated or revoked sooner.     Influenza A by PCR NEGATIVE NEGATIVE   Influenza B by PCR NEGATIVE NEGATIVE    Comment: (NOTE) The Xpert Xpress  SARS-CoV-2/FLU/RSV plus assay is intended as an aid in the diagnosis of influenza from Nasopharyngeal swab specimens and should not be used as a sole basis for treatment. Nasal washings and aspirates are unacceptable for Xpert Xpress SARS-CoV-2/FLU/RSV testing.  Fact Sheet for Patients: EntrepreneurPulse.com.au  Fact Sheet for Healthcare Providers: IncredibleEmployment.be  This test is not yet approved or cleared by the [place] FDA and has been authorized for detection and/or diagnosis of SARS-CoV-2 by FDA under an Emergency Use Authorization (EUA). This EUA will remain in effect (meaning this test can be used) for the duration of the COVID-19 declaration under Section 564(b)(1) of the Act, 21 U.S.C. section 360bbb-3(b)(1), unless the authorization is terminated or revoked.  Performed at [facility], [address] [place], [place] [address]   CBC     Status: Abnormal   Collection Time: 09/08/20  5:33 AM  Result Value Ref Range   WBC 8.7 4.0 - 10.5 K/uL   RBC 3.98 (L) 4.22 - 5.81 MIL/uL   Hemoglobin 11.7 (L) 13.0 - 17.0 g/dL   HCT 33.3 (L) 39.0 - 52.0 %   MCV 83.7 80.0 - 100.0 fL   MCH 29.4 26.0 - 34.0 pg   MCHC 35.1 30.0 - 36.0 g/dL   RDW 14.1 11.5 - 15.5 %   Platelets 217 150 - 400 K/uL   nRBC 0.0 0.0 - 0.2 %    Comment: Performed at [facility], [address] [place], [place] [address]  Creatinine, serum     Status: None   Collection Time: 09/08/20  5:33 AM  Result Value Ref Range   Creatinine, Ser 0.98 0.61 - 1.24 mg/dL   GFR, Estimated >60 >60 mL/min    Comment: (NOTE) Calculated using the CKD-EPI Creatinine Equation (2021) Performed at [facility], [address] [place], [place] [address]   Magnesium     Status: None   Collection Time: 09/08/20  5:33 AM  Result Value Ref Range   Magnesium 2.3 1.7 - 2.4 mg/dL    Comment: Performed at [facility], [address],  [place], [place] [address]  Phosphorus     Status: None   Collection Time: 09/08/20  5:33 AM  Result Value Ref Range   Phosphorus 3.2 2.5 - 4.6 mg/dL    Comment: Performed at [facility], [address] [place], [place] [address]  Basic metabolic panel     Status: Abnormal   Collection Time: 09/08/20  5:33 AM  Result Value Ref Range   Sodium 137 135 - 145 mmol/L   Potassium 2.6 (LL) 3.5 - 5.1 mmol/L    Comment: CRITICAL RESULT CALLED TO, READ BACK BY AND VERIFIED WITH JUAN HENDERSON AT 1448 ON 09/08/2020 [place].    Chloride 99 98 - 111 mmol/L   CO2 27 22 - 32 mmol/L   Glucose, Bld 83 70 - 99  mg/dL    Comment: Glucose reference range applies only to samples taken after fasting for at least 8 hours.   BUN 15 8 - 23 mg/dL   Creatinine, Ser 0.96 0.61 - 1.24 mg/dL   Calcium 7.6 (L) 8.9 - 10.3 mg/dL   GFR, Estimated >60 >60 mL/min    Comment: (NOTE) Calculated using the CKD-EPI Creatinine Equation (2021)    Anion gap 11 5 - 15    Comment: Performed at [facility], [place], [place] [address]  Troponin I (High Sensitivity)     Status: None   Collection Time: 09/08/20  5:33 AM  Result Value Ref Range   Troponin I (High Sensitivity) 9 <18 ng/L    Comment: (NOTE) Elevated high sensitivity troponin I (hsTnI) values and significant  changes across serial measurements may suggest ACS but many other  chronic and acute conditions are known to elevate hsTnI results.  Refer to the "Links" section for chest pain algorithms and additional  guidance. Performed at [facility], [address] [place], [place] [address]   TSH     Status: None   Collection Time: 09/08/20  5:33 AM  Result Value Ref Range   TSH 3.265 0.350 - 4.500 uIU/mL    Comment: Performed by a 3rd Generation assay with a functional sensitivity of <=0.01 uIU/mL. Performed at [facility], [address] [place], [place] [address]   Lipid panel     Status: Abnormal    Collection Time: 09/08/20  5:33 AM  Result Value Ref Range   Cholesterol 109 0 - 200 mg/dL   Triglycerides 53 <150 mg/dL   HDL 29 (L) >40 mg/dL   Total CHOL/HDL Ratio 3.8 RATIO   VLDL 11 0 - 40 mg/dL   LDL Cholesterol 69 0 - 99 mg/dL    Comment:        Total Cholesterol/HDL:CHD Risk Coronary Heart Disease Risk Table                     Men   Women  1/2 Average Risk   3.4   3.3  Average Risk       5.0   4.4  2 X Average Risk   9.6   7.1  3 X Average Risk  23.4   11.0        Use the calculated Patient Ratio above and the CHD Risk Table to determine the patient's CHD Risk.        ATP III CLASSIFICATION (LDL):  <100     mg/dL   Optimal  100-129  mg/dL   Near or Above                    Optimal  130-159  mg/dL   Borderline  160-189  mg/dL   High  >190     mg/dL   Very High Performed at [facility], [place], [place] [address]   Troponin I (High Sensitivity)     Status: None   Collection Time: 09/08/20  8:00 AM  Result Value Ref Range   Troponin I (High Sensitivity) 8 <18 ng/L    Comment: (NOTE) Elevated high sensitivity troponin I (hsTnI) values and significant  changes across serial measurements may suggest ACS but many other  chronic and acute conditions are known to elevate hsTnI results.  Refer to the "Links" section for chest pain algorithms and additional  guidance. Performed at [facility],  [place], [place] [address]   Basic metabolic panel     Status: Abnormal   Collection Time: 09/08/20  8:00 AM  Result Value Ref Range   Sodium 130 (L) 135 - 145 mmol/L   Potassium 2.5 (LL) 3.5 - 5.1 mmol/L    Comment: CRITICAL RESULT CALLED TO, READ BACK BY AND VERIFIED WITH ANGELA ROBBINS AT 0851 ON 09/08/2020 [place].    Chloride 96 (L) 98 - 111 mmol/L   CO2 25 22 - 32 mmol/L   Glucose, Bld 315 (H) 70 - 99 mg/dL    Comment: Glucose reference range applies only to samples taken after fasting for at least 8 hours.   BUN 14 8 -  23 mg/dL   Creatinine, Ser 0.98 0.61 - 1.24 mg/dL   Calcium 7.2 (L) 8.9 - 10.3 mg/dL   GFR, Estimated >60 >60 mL/min    Comment: (NOTE) Calculated using the CKD-EPI Creatinine Equation (2021)    Anion gap 9 5 - 15    Comment: Performed at [facility], [address] [place], [place] [address]  Glucose, capillary     Status: None   Collection Time: 09/08/20 10:11 AM  Result Value Ref Range   Glucose-Capillary 81 70 - 99 mg/dL    Comment: Glucose reference range applies only to samples taken after fasting for at least 8 hours.  MRSA PCR Screening     Status: None   Collection Time: 09/08/20 10:14 AM   Specimen: Nasal Mucosa; Nasopharyngeal  Result Value Ref Range   MRSA by PCR NEGATIVE NEGATIVE    Comment:        The GeneXpert MRSA Assay (FDA approved for NASAL specimens only), is one component of a comprehensive MRSA colonization surveillance program. It is not intended to diagnose MRSA infection nor to guide or monitor treatment for MRSA infections. Performed at [facility], [address] [place], [place] [address]   Basic metabolic panel     Status: Abnormal   Collection Time: 09/08/20  2:18 PM  Result Value Ref Range   Sodium 136 135 - 145 mmol/L   Potassium 3.4 (L) 3.5 - 5.1 mmol/L   Chloride 103 98 - 111 mmol/L   CO2 25 22 - 32 mmol/L   Glucose, Bld 89 70 - 99 mg/dL    Comment: Glucose reference range applies only to samples taken after fasting for at least 8 hours.   BUN 14 8 - 23 mg/dL   Creatinine, Ser 0.88 0.61 - 1.24 mg/dL   Calcium 7.8 (L) 8.9 - 10.3 mg/dL   GFR, Estimated >60 >60 mL/min    Comment: (NOTE) Calculated using the CKD-EPI Creatinine Equation (2021)    Anion gap 8 5 - 15    Comment: Performed at [facility], [address] [place], [place] [address]  Basic metabolic panel     Status: Abnormal   Collection Time: 09/08/20  8:04 PM  Result Value Ref Range   Sodium 136 135 - 145 mmol/L   Potassium 3.4 (L) 3.5  - 5.1 mmol/L   Chloride 104 98 - 111 mmol/L   CO2 24 22 - 32 mmol/L   Glucose, Bld 86 70 - 99 mg/dL    Comment: Glucose reference range applies only to samples taken after fasting for at least 8 hours.   BUN 11 8 - 23 mg/dL   Creatinine, Ser 0.74 0.61 - 1.24 mg/dL   Calcium 7.8 (L) 8.9 - 10.3 mg/dL   GFR,  Estimated >60 >60 mL/min    Comment: (NOTE) Calculated using the CKD-EPI Creatinine Equation (2021)    Anion gap 8 5 - 15    Comment: Performed at [facility], [address] [place], [place] [address]  Gastrointestinal Panel by PCR , Stool     Status: None   Collection Time: 09/08/20 11:00 PM   Specimen: Stool  Result Value Ref Range   Campylobacter species NOT DETECTED NOT DETECTED   Plesimonas shigelloides NOT DETECTED NOT DETECTED   Salmonella species NOT DETECTED NOT DETECTED   Yersinia enterocolitica NOT DETECTED NOT DETECTED   Vibrio species NOT DETECTED NOT DETECTED   Vibrio cholerae NOT DETECTED NOT DETECTED   Enteroaggregative E coli (EAEC) NOT DETECTED NOT DETECTED   Enteropathogenic E coli (EPEC) NOT DETECTED NOT DETECTED   Enterotoxigenic E coli (ETEC) NOT DETECTED NOT DETECTED   Shiga like toxin producing E coli (STEC) NOT DETECTED NOT DETECTED   Shigella/Enteroinvasive E coli (EIEC) NOT DETECTED NOT DETECTED   Cryptosporidium NOT DETECTED NOT DETECTED   Cyclospora cayetanensis NOT DETECTED NOT DETECTED   Entamoeba histolytica NOT DETECTED NOT DETECTED   Giardia lamblia NOT DETECTED NOT DETECTED   Adenovirus F40/41 NOT DETECTED NOT DETECTED   Astrovirus NOT DETECTED NOT DETECTED   Norovirus GI/GII NOT DETECTED NOT DETECTED   Rotavirus A NOT DETECTED NOT DETECTED   Sapovirus (I, II, IV, and V) NOT DETECTED NOT DETECTED    Comment: Performed at [facility], [address] [place], [place] [address]  Basic metabolic panel     Status: Abnormal   Collection Time: 09/09/20  1:34 AM  Result Value Ref Range   Sodium 138 135 - 145 mmol/L    Potassium 4.1 3.5 - 5.1 mmol/L   Chloride 108 98 - 111 mmol/L   CO2 25 22 - 32 mmol/L   Glucose, Bld 79 70 - 99 mg/dL    Comment: Glucose reference range applies only to samples taken after fasting for at least 8 hours.   BUN 9 8 - 23 mg/dL   Creatinine, Ser 0.78 0.61 - 1.24 mg/dL   Calcium 7.8 (L) 8.9 - 10.3 mg/dL   GFR, Estimated >60 >60 mL/min    Comment: (NOTE) Calculated using the CKD-EPI Creatinine Equation (2021)    Anion gap 5 5 - 15    Comment: Performed at [facility], [address] [place], [place] [address]  CBC     Status: Abnormal   Collection Time: 09/09/20  4:12 AM  Result Value Ref Range   WBC 6.8 4.0 - 10.5 K/uL   RBC 3.87 (L) 4.22 - 5.81 MIL/uL   Hemoglobin 11.5 (L) 13.0 - 17.0 g/dL   HCT 33.1 (L) 39.0 - 52.0 %   MCV 85.5 80.0 - 100.0 fL   MCH 29.7 26.0 - 34.0 pg   MCHC 34.7 30.0 - 36.0 g/dL   RDW 14.6 11.5 - 15.5 %   Platelets 187 150 - 400 K/uL   nRBC 0.0 0.0 - 0.2 %    Comment: Performed at [facility], [address], [place], [place] [address]  Magnesium     Status: None   Collection Time: 09/09/20  4:12 AM  Result Value Ref Range   Magnesium 1.9 1.7 - 2.4 mg/dL    Comment: Performed at [facility], [address] [place], [place] [address]  Phosphorus     Status: Abnormal   Collection Time: 09/09/20  4:12 AM  Result Value Ref  Range   Phosphorus 2.1 (L) 2.5 - 4.6 mg/dL    Comment: Performed at [facility], [address] [place], [place] [address]  Glucose, capillary     Status: Abnormal   Collection Time: 09/09/20  4:50 AM  Result Value Ref Range   Glucose-Capillary 64 (L) 70 - 99 mg/dL    Comment: Glucose reference range applies only to samples taken after fasting for at least 8 hours.  Glucose, capillary     Status: Abnormal   Collection Time: 09/09/20  6:29 AM  Result Value Ref Range   Glucose-Capillary 111 (H) 70 - 99 mg/dL    Comment: Glucose reference range applies only to samples taken after  fasting for at least 8 hours.  Basic metabolic panel     Status: Abnormal   Collection Time: 09/09/20  7:47 AM  Result Value Ref Range   Sodium 136 135 - 145 mmol/L   Potassium 3.9 3.5 - 5.1 mmol/L   Chloride 105 98 - 111 mmol/L   CO2 24 22 - 32 mmol/L   Glucose, Bld 104 (H) 70 - 99 mg/dL    Comment: Glucose reference range applies only to samples taken after fasting for at least 8 hours.   BUN 7 (L) 8 - 23 mg/dL   Creatinine, Ser 0.63 0.61 - 1.24 mg/dL   Calcium 7.8 (L) 8.9 - 10.3 mg/dL   GFR, Estimated >60 >60 mL/min    Comment: (NOTE) Calculated using the CKD-EPI Creatinine Equation (2021)    Anion gap 7 5 - 15    Comment: Performed at [facility], [address] [place], [place] [address]  Glucose, capillary     Status: None   Collection Time: 09/09/20  7:57 AM  Result Value Ref Range   Glucose-Capillary 88 70 - 99 mg/dL    Comment: Glucose reference range applies only to samples taken after fasting for at least 8 hours.  ECHOCARDIOGRAM COMPLETE     Status: None   Collection Time: 09/09/20  8:46 AM  Result Value Ref Range   Weight 2,515.01 oz   Height 74 in   BP 141/75 mmHg   Ao pk vel 1.58 m/s   AV Area VTI 2.15 cm2   AR max vel 2.33 cm2   AV Mean grad 6.0 mmHg   AV Peak grad 10.0 mmHg   S' Lateral 3.64 cm   AV Area mean vel 1.87 cm2   Area-P 1/2 2.53 cm2  Glucose, capillary     Status: None   Collection Time: 09/09/20 11:46 AM  Result Value Ref Range   Glucose-Capillary 85 70 - 99 mg/dL    Comment: Glucose reference range applies only to samples taken after fasting for at least 8 hours.  Glucose, capillary     Status: None   Collection Time: 09/09/20  3:05 PM  Result Value Ref Range   Glucose-Capillary 93 70 - 99 mg/dL    Comment: Glucose reference range applies only to samples taken after fasting for at least 8 hours.  Basic metabolic panel     Status: Abnormal   Collection Time: 09/09/20  5:15 PM  Result Value Ref Range   Sodium 136 135 - 145  mmol/L   Potassium 4.0 3.5 - 5.1 mmol/L   Chloride 102 98 - 111 mmol/L   CO2 27 22 - 32 mmol/L   Glucose, Bld 98 70 - 99 mg/dL    Comment: Glucose reference range applies only to samples taken after fasting for  at least 8 hours.   BUN 8 8 - 23 mg/dL   Creatinine, Ser 0.88 0.61 - 1.24 mg/dL   Calcium 8.0 (L) 8.9 - 10.3 mg/dL   GFR, Estimated >60 >60 mL/min    Comment: (NOTE) Calculated using the CKD-EPI Creatinine Equation (2021)    Anion gap 7 5 - 15    Comment: Performed at [facility], [address] [place], [place] [address]  Glucose, capillary     Status: None   Collection Time: 09/09/20  7:17 PM  Result Value Ref Range   Glucose-Capillary 96 70 - 99 mg/dL    Comment: Glucose reference range applies only to samples taken after fasting for at least 8 hours.   Comment 1 Notify RN    Comment 2 Document in Chart   Glucose, capillary     Status: None   Collection Time: 09/09/20 11:15 PM  Result Value Ref Range   Glucose-Capillary 95 70 - 99 mg/dL    Comment: Glucose reference range applies only to samples taken after fasting for at least 8 hours.   Comment 1 Notify RN    Comment 2 Document in Chart   CBC     Status: Abnormal   Collection Time: 09/10/20  4:48 AM  Result Value Ref Range   WBC 7.4 4.0 - 10.5 K/uL   RBC 4.08 (L) 4.22 - 5.81 MIL/uL   Hemoglobin 12.2 (L) 13.0 - 17.0 g/dL   HCT 35.3 (L) 39.0 - 52.0 %   MCV 86.5 80.0 - 100.0 fL   MCH 29.9 26.0 - 34.0 pg   MCHC 34.6 30.0 - 36.0 g/dL   RDW 15.1 11.5 - 15.5 %   Platelets 184 150 - 400 K/uL   nRBC 0.0 0.0 - 0.2 %    Comment: Performed at [facility], [address] [place], [place] [address]  Magnesium     Status: None   Collection Time: 09/10/20  4:48 AM  Result Value Ref Range   Magnesium 2.0 1.7 - 2.4 mg/dL    Comment: Performed at [facility], [address] [place], [place] [address]  Phosphorus     Status: None   Collection Time: 09/10/20  4:48 AM  Result Value Ref Range    Phosphorus 2.9 2.5 - 4.6 mg/dL    Comment: Performed at [facility], [address] [place], [place] [address]  Basic metabolic panel     Status: Abnormal   Collection Time: 09/10/20  4:48 AM  Result Value Ref Range   Sodium 138 135 - 145 mmol/L   Potassium 4.1 3.5 - 5.1 mmol/L   Chloride 106 98 - 111 mmol/L   CO2 27 22 - 32 mmol/L   Glucose, Bld 81 70 - 99 mg/dL    Comment: Glucose reference range applies only to samples taken after fasting for at least 8 hours.   BUN 9 8 - 23 mg/dL   Creatinine, Ser 0.93 0.61 - 1.24 mg/dL   Calcium 8.0 (L) 8.9 - 10.3 mg/dL   GFR, Estimated >60 >60 mL/min    Comment: (NOTE) Calculated using the CKD-EPI Creatinine Equation (2021)    Anion gap 5 5 - 15    Comment: Performed at [facility], [address] [place], [place] [address]  Glucose, capillary     Status: None   Collection Time: 09/10/20  5:03 AM  Result Value Ref Range   Glucose-Capillary 73 70 - 99 mg/dL    Comment: Glucose  reference range applies only to samples taken after fasting for at least 8 hours.   Comment 1 Notify RN    Comment 2 Document in Chart   Glucose, capillary     Status: Abnormal   Collection Time: 09/10/20  7:24 AM  Result Value Ref Range   Glucose-Capillary 68 (L) 70 - 99 mg/dL    Comment: Glucose reference range applies only to samples taken after fasting for at least 8 hours.   Comment 1 Notify RN   Glucose, capillary     Status: None   Collection Time: 09/10/20  7:57 AM  Result Value Ref Range   Glucose-Capillary 72 70 - 99 mg/dL    Comment: Glucose reference range applies only to samples taken after fasting for at least 8 hours.  Glucose, capillary     Status: Abnormal   Collection Time: 09/10/20 11:19 AM  Result Value Ref Range   Glucose-Capillary 107 (H) 70 - 99 mg/dL    Comment: Glucose reference range applies only to samples taken after fasting for at least 8 hours.  Glucose, capillary     Status: Abnormal   Collection Time:  09/10/20  3:52 PM  Result Value Ref Range   Glucose-Capillary 114 (H) 70 - 99 mg/dL    Comment: Glucose reference range applies only to samples taken after fasting for at least 8 hours.  Glucose, capillary     Status: None   Collection Time: 09/10/20  7:21 PM  Result Value Ref Range   Glucose-Capillary 88 70 - 99 mg/dL    Comment: Glucose reference range applies only to samples taken after fasting for at least 8 hours.  Glucose, capillary     Status: Abnormal   Collection Time: 09/10/20 11:31 PM  Result Value Ref Range   Glucose-Capillary 129 (H) 70 - 99 mg/dL    Comment: Glucose reference range applies only to samples taken after fasting for at least 8 hours.  Glucose, capillary     Status: None   Collection Time: 09/11/20  3:40 AM  Result Value Ref Range   Glucose-Capillary 84 70 - 99 mg/dL    Comment: Glucose reference range applies only to samples taken after fasting for at least 8 hours.  Magnesium     Status: None   Collection Time: 09/11/20  4:48 AM  Result Value Ref Range   Magnesium 1.9 1.7 - 2.4 mg/dL    Comment: Performed at [facility], [address] [place], [place] [address]  Phosphorus     Status: None   Collection Time: 09/11/20  4:48 AM  Result Value Ref Range   Phosphorus 3.6 2.5 - 4.6 mg/dL    Comment: Performed at [facility], [address] [place],  [address]  Basic metabolic panel     Status: Abnormal   Collection Time: 09/11/20  4:48 AM  Result Value Ref Range   Sodium 137 135 - 145 mmol/L   Potassium 4.2 3.5 - 5.1 mmol/L   Chloride 103 98 - 111 mmol/L   CO2 28 22 - 32 mmol/L   Glucose, Bld 92 70 - 99 mg/dL    Comment: Glucose reference range applies only to samples taken after fasting for at least 8 hours.   BUN 16 8 - 23 mg/dL   Creatinine, Ser 0.79 0.61 - 1.24 mg/dL   Calcium 8.1 (L) 8.9 - 10.3 mg/dL   GFR, Estimated >60 >60 mL/min    Comment: (NOTE) Calculated using the CKD-EPI Creatinine Equation (2021)  Anion  gap 6 5 - 15    Comment: Performed at [facility], [address] [place], [place] [address]  Glucose, capillary     Status: None   Collection Time: 09/11/20  7:39 AM  Result Value Ref Range   Glucose-Capillary 74 70 - 99 mg/dL    Comment: Glucose reference range applies only to samples taken after fasting for at least 8 hours.  Glucose, capillary     Status: None   Collection Time: 09/11/20 11:28 AM  Result Value Ref Range   Glucose-Capillary 76 70 - 99 mg/dL    Comment: Glucose reference range applies only to samples taken after fasting for at least 8 hours.   Comment 1 Notify RN   Glucose, capillary     Status: Abnormal   Collection Time: 09/11/20  4:52 PM  Result Value Ref Range   Glucose-Capillary 54 (L) 70 - 99 mg/dL    Comment: Glucose reference range applies only to samples taken after fasting for at least 8 hours.  Glucose, capillary     Status: Abnormal   Collection Time: 09/11/20  5:06 PM  Result Value Ref Range   Glucose-Capillary 52 (L) 70 - 99 mg/dL    Comment: Glucose reference range applies only to samples taken after fasting for at least 8 hours.   Comment 1 Notify RN   Glucose, capillary     Status: Abnormal   Collection Time: 09/11/20  5:24 PM  Result Value Ref Range   Glucose-Capillary 17 (LL) 70 - 99 mg/dL    Comment: Glucose reference range applies only to samples taken after fasting for at least 8 hours. QUESTIONABLE RESULTS - CHARGE CREDITED REPEATED TO VERIFY Performed at [facility], [address] [place], [place] [address]    Comment 1 Repeat Test   Glucose, capillary     Status: Abnormal   Collection Time: 09/11/20  5:26 PM  Result Value Ref Range   Glucose-Capillary 116 (H) 70 - 99 mg/dL    Comment: Glucose reference range applies only to samples taken after fasting for at least 8 hours.  Glucose, capillary     Status: None   Collection Time: 09/11/20  8:48 PM  Result Value Ref Range   Glucose-Capillary 91 70 - 99 mg/dL     Comment: Glucose reference range applies only to samples taken after fasting for at least 8 hours.  Glucose, capillary     Status: Abnormal   Collection Time: 09/11/20 11:35 PM  Result Value Ref Range   Glucose-Capillary 106 (H) 70 - 99 mg/dL    Comment: Glucose reference range applies only to samples taken after fasting for at least 8 hours.  Magnesium     Status: None   Collection Time: 09/12/20  3:22 AM  Result Value Ref Range   Magnesium 2.0 1.7 - 2.4 mg/dL    Comment: Performed at [facility], [address] [place], [place] [address]  Phosphorus     Status: None   Collection Time: 09/12/20  3:22 AM  Result Value Ref Range   Phosphorus 3.7 2.5 - 4.6 mg/dL    Comment: Performed at [facility], [address] [place],  [address]  Basic metabolic panel     Status: Abnormal   Collection Time: 09/12/20  3:22 AM  Result Value Ref Range   Sodium 139 135 - 145 mmol/L   Potassium 4.2 3.5 - 5.1 mmol/L   Chloride 104 98 - 111 mmol/L  CO2 28 22 - 32 mmol/L   Glucose, Bld 80 70 - 99 mg/dL    Comment: Glucose reference range applies only to samples taken after fasting for at least 8 hours.   BUN 13 8 - 23 mg/dL   Creatinine, Ser 0.72 0.61 - 1.24 mg/dL   Calcium 7.9 (L) 8.9 - 10.3 mg/dL   GFR, Estimated >60 >60 mL/min    Comment: (NOTE) Calculated using the CKD-EPI Creatinine Equation (2021)    Anion gap 7 5 - 15    Comment: Performed at [facility], [address] [place], [place] [address]  Glucose, capillary     Status: Abnormal   Collection Time: 09/12/20  4:56 AM  Result Value Ref Range   Glucose-Capillary 55 (L) 70 - 99 mg/dL    Comment: Glucose reference range applies only to samples taken after fasting for at least 8 hours.  Glucose, capillary     Status: Abnormal   Collection Time: 09/12/20  5:56 AM  Result Value Ref Range   Glucose-Capillary 62 (L) 70 - 99 mg/dL    Comment: Glucose reference range applies only to samples taken  after fasting for at least 8 hours.  Glucose, capillary     Status: Abnormal   Collection Time: 09/12/20  7:45 AM  Result Value Ref Range   Glucose-Capillary 125 (H) 70 - 99 mg/dL    Comment: Glucose reference range applies only to samples taken after fasting for at least 8 hours.  Glucose, capillary     Status: None   Collection Time: 09/12/20 11:25 AM  Result Value Ref Range   Glucose-Capillary 84 70 - 99 mg/dL    Comment: Glucose reference range applies only to samples taken after fasting for at least 8 hours.  Glucose, capillary     Status: Abnormal   Collection Time: 09/12/20  4:13 PM  Result Value Ref Range   Glucose-Capillary 119 (H) 70 - 99 mg/dL    Comment: Glucose reference range applies only to samples taken after fasting for at least 8 hours.  Glucose, capillary     Status: Abnormal   Collection Time: 09/12/20  7:31 PM  Result Value Ref Range   Glucose-Capillary 100 (H) 70 - 99 mg/dL    Comment: Glucose reference range applies only to samples taken after fasting for at least 8 hours.  Glucose, capillary     Status: Abnormal   Collection Time: 09/12/20 11:22 PM  Result Value Ref Range   Glucose-Capillary 125 (H) 70 - 99 mg/dL    Comment: Glucose reference range applies only to samples taken after fasting for at least 8 hours.  Glucose, capillary     Status: None   Collection Time: 09/13/20  3:47 AM  Result Value Ref Range   Glucose-Capillary 91 70 - 99 mg/dL    Comment: Glucose reference range applies only to samples taken after fasting for at least 8 hours.  Magnesium     Status: None   Collection Time: 09/13/20  5:20 AM  Result Value Ref Range   Magnesium 2.0 1.7 - 2.4 mg/dL    Comment: Performed at [facility], [address] [place], [place] [address]  Phosphorus     Status: None   Collection Time: 09/13/20  5:20 AM  Result Value Ref Range   Phosphorus 3.9 2.5 - 4.6 mg/dL    Comment: Performed at [facility], [address]  [place], [place] [address]  Basic metabolic panel  Status: Abnormal   Collection Time: 09/13/20  5:20 AM  Result Value Ref Range   Sodium 136 135 - 145 mmol/L   Potassium 4.5 3.5 - 5.1 mmol/L   Chloride 100 98 - 111 mmol/L   CO2 27 22 - 32 mmol/L   Glucose, Bld 132 (H) 70 - 99 mg/dL    Comment: Glucose reference range applies only to samples taken after fasting for at least 8 hours.   BUN 20 8 - 23 mg/dL   Creatinine, Ser 0.75 0.61 - 1.24 mg/dL   Calcium 8.5 (L) 8.9 - 10.3 mg/dL   GFR, Estimated >60 >60 mL/min    Comment: (NOTE) Calculated using the CKD-EPI Creatinine Equation (2021)    Anion gap 9 5 - 15    Comment: Performed at [facility], [address] [place], [place] [address]  Cortisol-am, blood     Status: None   Collection Time: 09/13/20  5:20 AM  Result Value Ref Range   Cortisol - AM 8.0 6.7 - 22.6 ug/dL    Comment: Performed at [place] [address] [place], [place] [address]  Glucose, capillary     Status: None   Collection Time: 09/13/20  7:42 AM  Result Value Ref Range   Glucose-Capillary 84 70 - 99 mg/dL    Comment: Glucose reference range applies only to samples taken after fasting for at least 8 hours.  Glucose, capillary     Status: None   Collection Time: 09/13/20 11:28 AM  Result Value Ref Range   Glucose-Capillary 97 70 - 99 mg/dL    Comment: Glucose reference range applies only to samples taken after fasting for at least 8 hours.     PHQ2/9: Depression screen [facility] 2/9 09/18/2020 05/27/2019 01/13/2019 11/19/2018 09/01/2018  Decreased Interest 3 0 0 1 0  Down, Depressed, Hopeless 3 3 2 1 3   PHQ - 2 Score 6 3 2 2 3   Altered sleeping 3 0 0 1 0  Tired, decreased energy 3 3 3  - 0  Change in appetite 1 3 3 3 2   Feeling bad or failure about yourself  0 0 0 1 2  Trouble concentrating 0 2 3 1  0  Moving slowly or fidgety/restless 3 0 1 1 0  Suicidal thoughts 0 0 0 0 0  PHQ-9 Score 16 11 12 9 7   Difficult doing work/chores Extremely  dIfficult Somewhat difficult Very difficult Somewhat difficult Not difficult at all  Some recent data might be hidden    phq 9 is positive   Fall Risk: Fall Risk  09/18/2020 05/27/2019 09/01/2018 05/21/2018 01/27/2018  Falls in the past year? 1 1 1 1  Yes  Number falls in past yr: 1 1 1 1 2  or more  Comment - - - pt states approx 20 falls in the past year over the spring and summer -  Injury with Fall? 1 0 0 1 Yes  Comment - - - bruising, abrasions - no major injuries -  Risk Factor Category  - - - - High Fall Risk  Risk for fall due to : Impaired balance/gait;Impaired mobility;History of fall(s) Impaired balance/gait;History of fall(s);Impaired mobility - History of fall(s);Impaired balance/gait History of fall(s);Impaired balance/gait;Impaired mobility  Risk for fall due to: Comment - - - - -  Follow up - Falls prevention discussed - Falls prevention discussed;Falls evaluation completed -     Functional Status Survey: Is the patient deaf or have difficulty hearing?: No Does the patient have difficulty  seeing, even when wearing glasses/contacts?: Yes Does the patient have difficulty concentrating, remembering, or making decisions?: Yes Does the patient have difficulty walking or climbing stairs?: Yes Does the patient have difficulty dressing or bathing?: No Does the patient have difficulty doing errands alone such as visiting a doctor's office or shopping?: Yes    Assessment & Plan  1. Nonischemic cardiomyopathy ([place])  Evaluated by Dr. Saunders Revel   2. Hospital discharge follow-up   3. Severe protein-calorie malnutrition ([place])  Needs to take protein supplements, taking Ensure daily , seeing a nutrition at the [place]   4. Acute cystitis without hematuria  - CULTURE, URINE COMPREHENSIVE  5. Ventricular tachycardia (HCC)  Resolved   6. Hypokalemia  - Potassium - Magnesium  7. Senile purpura (HCC)  Both arms, reassurance given   8. Primary malignant neoplasm of lung metastatic  to other site, unspecified laterality ([place])  On medication and has follow up in 2 weeks   9. 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 Major depression, recurrent, chronic ([place])  Resume lexapro. Keep follow up with [facility] psychiatrist

## 2020-09-18 ENCOUNTER — Encounter: Payer: Self-pay | Admitting: Family Medicine

## 2020-09-18 ENCOUNTER — Other Ambulatory Visit: Payer: Self-pay

## 2020-09-18 ENCOUNTER — Ambulatory Visit (INDEPENDENT_AMBULATORY_CARE_PROVIDER_SITE_OTHER): Payer: Medicare HMO | Admitting: Family Medicine

## 2020-09-18 VITALS — BP 110/62 | HR 60 | Temp 97.7°F | Resp 18 | Ht 73.0 in | Wt 153.2 lb

## 2020-09-18 DIAGNOSIS — C3490 Malignant neoplasm of unspecified part of unspecified bronchus or lung: Secondary | ICD-10-CM | POA: Diagnosis not present

## 2020-09-18 DIAGNOSIS — Z09 Encounter for follow-up examination after completed treatment for conditions other than malignant neoplasm: Secondary | ICD-10-CM | POA: Diagnosis not present

## 2020-09-18 DIAGNOSIS — R530 Neoplastic (malignant) related fatigue: Secondary | ICD-10-CM | POA: Insufficient documentation

## 2020-09-18 DIAGNOSIS — I472 Ventricular tachycardia, unspecified: Secondary | ICD-10-CM

## 2020-09-18 DIAGNOSIS — C772 Secondary and unspecified malignant neoplasm of intra-abdominal lymph nodes: Secondary | ICD-10-CM | POA: Diagnosis not present

## 2020-09-18 DIAGNOSIS — M5010 Cervical disc disorder with radiculopathy, unspecified cervical region: Secondary | ICD-10-CM | POA: Insufficient documentation

## 2020-09-18 DIAGNOSIS — H2513 Age-related nuclear cataract, bilateral: Secondary | ICD-10-CM | POA: Insufficient documentation

## 2020-09-18 DIAGNOSIS — E43 Unspecified severe protein-calorie malnutrition: Secondary | ICD-10-CM | POA: Diagnosis not present

## 2020-09-18 DIAGNOSIS — N3000 Acute cystitis without hematuria: Secondary | ICD-10-CM

## 2020-09-18 DIAGNOSIS — I428 Other cardiomyopathies: Secondary | ICD-10-CM

## 2020-09-18 DIAGNOSIS — G5600 Carpal tunnel syndrome, unspecified upper limb: Secondary | ICD-10-CM | POA: Insufficient documentation

## 2020-09-18 DIAGNOSIS — Z5112 Encounter for antineoplastic immunotherapy: Secondary | ICD-10-CM | POA: Insufficient documentation

## 2020-09-18 DIAGNOSIS — D692 Other nonthrombocytopenic purpura: Secondary | ICD-10-CM | POA: Diagnosis not present

## 2020-09-18 DIAGNOSIS — E876 Hypokalemia: Secondary | ICD-10-CM

## 2020-09-18 DIAGNOSIS — F339 Major depressive disorder, recurrent, unspecified: Secondary | ICD-10-CM

## 2020-09-18 DIAGNOSIS — F4323 Adjustment disorder with mixed anxiety and depressed mood: Secondary | ICD-10-CM | POA: Insufficient documentation

## 2020-09-18 DIAGNOSIS — N401 Enlarged prostate with lower urinary tract symptoms: Secondary | ICD-10-CM | POA: Insufficient documentation

## 2020-09-18 DIAGNOSIS — Z515 Encounter for palliative care: Secondary | ICD-10-CM | POA: Insufficient documentation

## 2020-09-19 ENCOUNTER — Telehealth: Payer: Self-pay

## 2020-09-19 DIAGNOSIS — E876 Hypokalemia: Secondary | ICD-10-CM | POA: Diagnosis not present

## 2020-09-19 DIAGNOSIS — N3000 Acute cystitis without hematuria: Secondary | ICD-10-CM | POA: Diagnosis not present

## 2020-09-19 LAB — POTASSIUM: Potassium: 4.9 mmol/L (ref 3.5–5.3)

## 2020-09-19 LAB — MAGNESIUM: Magnesium: 2.4 mg/dL (ref 1.5–2.5)

## 2020-09-19 NOTE — Telephone Encounter (Signed)
Pt was seen yesterday and forgot to mention that he has been having diarrhea for over a month (2-3x per day). The hospital took him off imodium.please advise. Pt see pallative Dr on tomorrow

## 2020-09-19 NOTE — Telephone Encounter (Signed)
Drew up and awaiting pick up from courier.

## 2020-09-19 NOTE — Telephone Encounter (Signed)
Pt also dropped off a urine sample and it is in the lab

## 2020-09-23 LAB — CULTURE, URINE COMPREHENSIVE
MICRO NUMBER:: 11880458
RESULT:: NO GROWTH
SPECIMEN QUALITY:: ADEQUATE

## 2020-09-24 NOTE — TOC Progression Note (Signed)
Transition of Care (TOC) - Progression Note    Patient Details  Name: Darren Thomas. MRN: [id number] Date of Birth: [birth year]  Transition of Care [facility]) CM/SW Contact  Anselm Pancoast, RN Phone Number: 09/24/2020, 9:26 AM  Clinical Narrative:    Received call from Heide Scales, who reports they have been waiting on [facility] to contact them. RN CM confirmed no HHC had been ordered at discharge however patient could contact PCP and discuss possible need.  Spouse states patient was set up with the [facility] for OP PT however he missed the timeframe due to [place] and she does not think he is strong enough at this time to do OP. Spouse appreciative of call and confirmed she would outreach to PCP and discuss need for [place]. Patient was previously involved with [facility] last year and may possibly be returning if [facility] is not helpful.         Expected Discharge Plan and Services           Expected Discharge Date: 09/13/20                                     Social Determinants of Health (SDOH) Interventions    Readmission Risk Interventions No flowsheet data found.

## 2020-09-27 ENCOUNTER — Ambulatory Visit: Payer: Medicare HMO | Admitting: Physician Assistant

## 2020-09-27 ENCOUNTER — Encounter: Payer: Self-pay | Admitting: Physician Assistant

## 2020-09-27 ENCOUNTER — Other Ambulatory Visit: Payer: Self-pay

## 2020-09-27 ENCOUNTER — Ambulatory Visit (INDEPENDENT_AMBULATORY_CARE_PROVIDER_SITE_OTHER): Payer: Medicare HMO

## 2020-09-27 VITALS — BP 100/58 | HR 78 | Ht 73.0 in | Wt 148.0 lb

## 2020-09-27 DIAGNOSIS — E878 Other disorders of electrolyte and fluid balance, not elsewhere classified: Secondary | ICD-10-CM

## 2020-09-27 DIAGNOSIS — I472 Ventricular tachycardia: Secondary | ICD-10-CM | POA: Diagnosis not present

## 2020-09-27 DIAGNOSIS — Z87898 Personal history of other specified conditions: Secondary | ICD-10-CM | POA: Diagnosis not present

## 2020-09-27 DIAGNOSIS — I428 Other cardiomyopathies: Secondary | ICD-10-CM | POA: Diagnosis not present

## 2020-09-27 DIAGNOSIS — Z8679 Personal history of other diseases of the circulatory system: Secondary | ICD-10-CM

## 2020-09-27 DIAGNOSIS — C3490 Malignant neoplasm of unspecified part of unspecified bronchus or lung: Secondary | ICD-10-CM

## 2020-09-27 NOTE — Progress Notes (Signed)
Office Visit    Patient Name: Darren Thomas. Date of Encounter: 09/30/2020  PCP:  Steele Sizer, MD   [place]  Cardiologist:  Kate Sable, MD  Advanced Practice Provider:  No care team member to display Electrophysiologist:  None   :[id number]}   Chief Complaint    Chief Complaint  Patient presents with  . Follow-up    Follow up for visit to hospital for tachycardia. Medications verbally reviewed with patient.     75 y.o. male h/o Parkinson's disease, lung cancer, recent COVID-19 infection in April 2022, polymorphic VT, and being seen today after recent admission for syncope with ventricular tachycardia in the setting of significant electrolyte abnormalities.  Past Medical History    Past Medical History:  Diagnosis Date  . Abnormality of gait   . Back pain   . Cancer ([place]) 04/2019   stage IV lung cancer being treated at [facility], left hip muscle, stomach, top of small intesting, liver and lymphnode near lungs  . Cervical spondylosis with myelopathy 01/06/2014  . Depression   . Insomnia   . Parkinson disease ([place]) 04/2018   diagnosed by neurology at [facility]  . PTSD (post-traumatic stress disorder)   . PTSD (post-traumatic stress disorder)    Past Surgical History:  Procedure Laterality Date  . C-spine     C-3-7 removed  . LEFT HEART CATH AND CORONARY ANGIOGRAPHY N/A 09/11/2020   Procedure: LEFT HEART CATH AND CORONARY ANGIOGRAPHY;  Surgeon: Nelva Bush, MD;  Location: [facility];  Service: Cardiovascular;  Laterality: N/A;  . SCALP LESION REMOVAL W/ FLAP AND SKIN GRAFT    . SKIN CANCER EXCISION Left    squamos cell cancinoma    Allergies  Allergies  Allergen Reactions  . Augmentin [Amoxicillin-Pot Clavulanate] Diarrhea  . Duloxetine     Other reaction(s): Chest pain    History of Present Illness    Darren Thomas. is a 75 y.o. male with PMH as above.  He has history of Parkinson's disease, PTSD, stage IV  lung cancer, and recently was admitted with syncope/ventricular tachycardia.  He reportedly passed out at home when seated in his recliner.  His wife noticed him becoming unresponsive and called EMS.  Prior to this event, he reportedly had a 5-day history of nausea, reduced appetite, and diarrhea.  He reported palpitations and feelings of warmth in his head/neck associated with these palpitations.  No previous reported history of syncope/passing out.  No reported history of heart disease.  He was diagnosed with COVID-19 approximately 3 weeks prior to his admission.  He reported occasional constipation, and for which he took a stool softener.  In the ED, telemetry monitoring showed nonsustained ventricular tachycardia.  He was given IV amiodarone with widening of QRS.  Lidocaine drip was started due to widening QRS with amiodarone administration.  After discontinuation of lidocaine and metoprolol, no further ventricular ectopy appreciated.  Ventricular rate were noted to be low with occasional PACs.  Catheterization was performed and showed mild to moderate nonobstructive CAD.  He had low normal LVEDP.    It was suspected that VT may have been mediated by significant electrolyte abnormalities and prolonged QT on admission.  EP consultation was performed with recommendation for electrolyte correction and device declined by patient.  It was recommended he avoid QT prolonging agents.  Given his chronic diarrhea, it was stressed that frequent outpatient labs to maintain potassium and magnesium levels at goal were recommended.  Given bradycardia and hypotension, beta-blocker/ACE/ARB were deferred.  He was continued on ASA with recommendation to defer statin in the setting of his comorbidities and LDL on admission of 69.  Today, 09/27/2020, he returns to clinic and notes ongoing weakness.   Today, we reviewed his admission with patient report he was told ICD was not recommended by EP, though he would potentially be  agreeable to it.  Darren Thomas states he was under the impression an ICD was not recommended as VT was likely secondary to electrolyte abnormalities and prolonged QT with recommendation to correct both.  On review of EP consult note, however, it is noted in the EP assessment and plan that ICD is not indicated given the reversibility of his NSVT and the fact that he has active metastatic malignancy.  ICD was discussed with the patient, and he also was not interested in pursuing ICD, given it did not align with his goals of care.  Recommendation was against antiarrhythmics at this time.  It was recommended the pharmacy review all medications and provide a list of QT prolonging medications to avoid.  Electrolyte repletion was recommended.  Today, he continues to note weakness, and he continues to have diarrhea with his oncologist reportedly about to switch up his outpatient cancer treatment to see if this resolves the patient's diarrhea.  With ongoing diarrhea, he has been maintained on potassium supplementation, provided at discharge.  We had a prolonged discussion on QT prolonging medications and recommendation to stop taking loperamide, given this will prolong his QT.  He denies cough but notes recent increased mucus that is white and clear.  He plans to see his allergist.  He denies chest pain and any signs or symptoms of volume overload.  He reports poor oral intake.   Home Medications   Current Outpatient Medications  Medication Instructions  . buPROPion (WELLBUTRIN XL) 150 mg, Oral, Daily  . carbidopa-levodopa (SINEMET IR) 25-250 MG tablet 2 tablets, Oral, 3 times daily  . diclofenac Sodium (VOLTAREN) 1 % GEL 1 application, Topical, Daily PRN  . fluticasone (FLONASE) 50 MCG/ACT nasal spray USE 2 SPRAYS IN EACH NOSTRIL EVERY DAY  . loratadine (CLARITIN) 10 mg, Oral, Daily PRN  . melatonin 5 mg, Oral, Daily at bedtime  . memantine (NAMENDA) 5 MG tablet Oral  . mirtazapine (REMERON) 7.5 MG tablet Oral   . Multiple Vitamins-Minerals (COMPLETE MULTIVITAMIN/MINERAL PO) 1 tablet, Daily  . Multiple Vitamins-Minerals (PRESERVISION AREDS 2 PO) 1 tablet, Oral, 2 times daily  . pembrolizumab (KEYTRUDA) 100 MG/4ML SOLN 2 mg/kg, Intravenous, Immunotherapy treatments every 6 weeks at [facility]   . potassium chloride (KLOR-CON) 20 MEQ packet 40 mEq, Oral, Daily  . senna (SENOKOT) 8.6 MG tablet 1 tablet, Oral, Daily PRN  . sodium chloride (MURO 128) 5 % ophthalmic solution 1 drop, Both Eyes, Daily  . tamsulosin (FLOMAX) 0.4 MG CAPS capsule 1 capsule, Oral, Every other day  . traZODone (DESYREL) 100 MG tablet 1 tablet, Oral, Daily at bedtime  . triamcinolone cream (KENALOG) 0.1 % 1 application, Topical, Daily PRN     Review of Systems    He denies chest pain, palpitations, dyspnea, pnd, orthopnea, n, v, dizziness, syncope, edema, weight gain, or early satiety.  He reports poor oral intake, diarrhea, and weakness.  All other systems reviewed and are otherwise negative except as noted above.  Physical Exam    VS:  BP (!) 100/58 (BP Location: Right Arm, Patient Position: Sitting, Cuff Size: Normal)   Pulse 78  Ht 6\' 1"  (1.854 m)   Wt 148 lb (67.1 kg)   SpO2 97%   BMI 19.53 kg/m  , BMI Body mass index is 19.53 kg/m. GEN: Thin and elderly male, cachectic, seated in wheelchair next to wife, in no acute distress. HEENT: normal. Neck: Supple, no JVD, carotid bruits, or masses. Cardiac: RRR, no murmurs, rubs, or gallops. No clubbing, cyanosis.  1-2+ bilateral edema.  Radials/DP/PT 2+ and equal bilaterally.  Respiratory: clear to auscultation bilaterally. GI: Soft, nontender, nondistended, BS + x 4. MS: no deformity or atrophy. Skin: warm and dry, no rash. Neuro:  Strength and sensation are intact. Psych: Normal affect.  Accessory Clinical Findings    ECG personally reviewed by me today - NSR, LBB with QRS 19ms and QTc 499 ms increased from 09/12/2020 EKG), RAE with peaked P waves, poor R wave  progression in V1-III  - no acute changes.  VITALS Reviewed today   Temp Readings from Last 3 Encounters:  09/18/20 97.7 F (36.5 C)  09/13/20 (!) 97.5 F (36.4 C) (Oral)  06/15/19 98.7 F (37.1 C) (Oral)   BP Readings from Last 3 Encounters:  09/27/20 (!) 100/58  09/18/20 110/62  09/13/20 (!) 135/52   Pulse Readings from Last 3 Encounters:  09/27/20 78  09/18/20 60  09/13/20 67    Wt Readings from Last 3 Encounters:  09/27/20 148 lb (67.1 kg)  09/18/20 153 lb 3.2 oz (69.5 kg)  09/13/20 157 lb 10.1 oz (71.5 kg)     LABS  reviewed today   Lab Results  Component Value Date   WBC 7.4 09/10/2020   HGB 12.2 (L) 09/10/2020   HCT 35.3 (L) 09/10/2020   MCV 86.5 09/10/2020   PLT 184 09/10/2020   Lab Results  Component Value Date   CREATININE 0.85 09/27/2020   BUN 14 09/27/2020   NA 141 09/27/2020   K 4.9 09/27/2020   CL 102 09/27/2020   CO2 23 09/27/2020   Lab Results  Component Value Date   ALT 9 09/07/2020   AST 21 09/07/2020   ALKPHOS 60 09/07/2020   BILITOT 1.0 09/07/2020   Lab Results  Component Value Date   CHOL 109 09/08/2020   HDL 29 (L) 09/08/2020   LDLCALC 69 09/08/2020   TRIG 53 09/08/2020   CHOLHDL 3.8 09/08/2020    Lab Results  Component Value Date   HGBA1C 4.8 05/15/2017   Lab Results  Component Value Date   TSH 3.265 09/08/2020     STUDIES/PROCEDURES reviewed today    2D echo 09/09/2020: 1. Left ventricular ejection fraction, by estimation, is 45 to 50%. The  left ventricle has mildly decreased function. The left ventricle has no  regional wall motion abnormalities. Left ventricular diastolic parameters  were normal.  2. Right ventricular systolic function is normal. The right ventricular  size is normal. There is normal pulmonary artery systolic pressure.  3. The mitral valve is grossly normal. Trivial mitral valve  regurgitation.  4. The aortic valve is grossly normal. Aortic valve regurgitation is not  visualized. Mild  aortic valve sclerosis is present, with no evidence of  aortic valve stenosis.  5. The inferior vena cava is normal in size with greater than 50%  respiratory variability, suggesting right atrial pressure of 3 mmHg. __________  LHC 09/11/2020: Conclusions: 1. Mild to moderate, nonobstructive coronary artery disease. No critical lesion identified to explain patient's ventricular tachycardia and/or cardiomyopathy. 2. Normal left ventricular filling pressure.  Recommendations: 1. Medical therapy and risk  factor modification to prevent progression of coronary artery disease. 2. EP consultation for further recommendations regarding management of syncope and ventricular tachycardia.   Assessment & Plan    Polymorphic VT -- No current tachypalpitations.  EKG without VT.  He continues his potassium supplementation with repeat labs ordered today, as I am unable to see the [place] labs at this time.  Recommend discontinuation of loperamide again, given this medication can prolong QT.  He was evaluated by EP 09/12/2020 and ICD not recommended in the setting of his current goals of care with metastatic cancer and due to reversibility of the cause of his NSVT.  Antiarrhythmics also not recommended.  EKG shows QTC today 499.  Repeat EKG at next clinic visit and ensure he is not taking loperamide -discussed as a medication that he should avoid moving forward.  Addition of beta-blocker deferred due to current hypotensive at 100/58 with ongoing diarrhea; however, rates have improved to 78 bpm (bradycardic during admission).  If room in BP at RTC, reassess addition of BB.  At discharge, electrolytes WNL.  Given he still has 1 more box of potassium supplementation, will obtain a BMET and magnesium today to ensure electrolytes still WNL. Further recommendations once labs resulted.  Will place live Zio AT today to ensure no further VT with reversibility of electrolyte abnormality and prolonged QT, and given the syncopal  event before admission.  Nonischemic cardiomyopathy -No reported shortness of breath.  EF mildly reduced by echo obtained during admission.  LHC with mild to moderate nonobstructive CAD.  As above, he is not on a beta-blocker due to history of bradycardic rates during admission and current soft pressure as above.  ACE/ARB/Arni/MRA deferred due to hypotension as well.  He does have increased lower extremity edema today.  Suspect at least some element of dependent edema.  We will continue to defer initiation of diuresis, given his soft pressures today with ongoing diarrhea.  Elevation and compression stockings discussed.  Nonobstructive CAD -- No reported chest pain.  LHC as above without obstructive disease.  Continue GDMT as tolerated by heart rate and blood pressure.  Aggressive risk factor modification recommended.  Lung cancer -- Continue to follow with the [place].  He reports that his oncologist intends to switch up his current treatment, which may improve his diarrhea.  He cannot use loperamide due to prolonged QT.  Medication changes: Deferred beta-blocker/ACE/ARB due to hypotension Labs ordered: BMET, magnesium Studies / Imaging ordered: Live Zio AT, given history of VT, and to ensure that no further arrhythmia now that electrolytes have been corrected and he is avoiding QT prolonging medications (he will discontinue loperamide) Future considerations:?  Follow-up with EP Disposition: RTC after Zio  *Please be aware that the above documentation was completed voice recognition software and may contain dictation errors.      Arvil Chaco, PA-C

## 2020-09-27 NOTE — Patient Instructions (Addendum)
Medication Instructions:  Your physician recommends that you continue on your current medications as directed. Please refer to the Current Medication list given to you today.  *If you need a refill on your cardiac medications before your next appointment, please call your pharmacy*   Lab Work: BMP and magnesium level to drawn drawn today  If you have labs (blood work) drawn today and your tests are completely normal, you will receive your results only by: Marland Kitchen MyChart Message (if you have MyChart) OR . A paper copy in the mail If you have any lab test that is abnormal or we need to change your treatment, we will call you to review the results.   Testing/Procedures: Your physician has recommended that you wear a Zio AT LIVE monitor.   This monitor is a medical device that records the heart's electrical activity. Doctors most often use these monitors to diagnose arrhythmias. Arrhythmias are problems with the speed or rhythm of the heartbeat. The monitor is a small device applied to your chest. You can wear one while you do your normal daily activities. While wearing this monitor if you have any symptoms to push the button and record what you felt. Once you have worn this monitor for the period of time provider prescribed (Usually 14 days), you will return the monitor device in the postage paid box. Once it is returned they will download the data collected and provide [place] with a report which the provider will then review and we will call you with those results. Important tips:  1. Avoid showering during the first 24 hours of wearing the monitor. 2. Avoid excessive sweating to help maximize wear time. 3. Do not submerge the device, no hot tubs, and no swimming pools. 4. Keep any lotions or oils away from the patch. 5. After 24 hours you may shower with the patch on. Take brief showers with your back facing the shower head.  6. Do not remove patch once it has been placed because that will interrupt  data and decrease adhesive wear time. 7. Push the button when you have any symptoms and write down what you were feeling. 8. Once you have completed wearing your monitor, remove and place into box which has postage paid and place in your outgoing mailbox.  9. If for some reason you have misplaced your box then call our office and we can provide another box and/or mail it off for you.  Follow-Up: At [facility], you and your health needs are our priority.  As part of our continuing mission to provide you with exceptional heart care, we have created designated Provider Care Teams.  These Care Teams include your primary Cardiologist (physician) and Advanced Practice Providers (APPs -  Physician Assistants and Nurse Practitioners) who all work together to provide you with the care you need, when you need it.  We recommend signing up for the patient portal called "MyChart".  Sign up information is provided on this After Visit Summary.  MyChart is used to connect with patients for Virtual Visits (Telemedicine).  Patients are able to view lab/test results, encounter notes, upcoming appointments, etc.  Non-urgent messages can be sent to your provider as well.   To learn more about what you can do with MyChart, go to [email or web address].    Your next appointment:   4 week(s)  The format for your next appointment:   In Person  Provider:  Marrianne Mood, PA-C

## 2020-09-28 DIAGNOSIS — I472 Ventricular tachycardia: Secondary | ICD-10-CM | POA: Diagnosis not present

## 2020-09-28 LAB — BASIC METABOLIC PANEL
BUN/Creatinine Ratio: 16 (ref 10–24)
BUN: 14 mg/dL (ref 8–27)
CO2: 23 mmol/L (ref 20–29)
Calcium: 9.2 mg/dL (ref 8.6–10.2)
Chloride: 102 mmol/L (ref 96–106)
Creatinine, Ser: 0.85 mg/dL (ref 0.76–1.27)
Glucose: 91 mg/dL (ref 65–99)
Potassium: 4.9 mmol/L (ref 3.5–5.2)
Sodium: 141 mmol/L (ref 134–144)
eGFR: 89 mL/min/{1.73_m2} (ref >59)

## 2020-10-03 ENCOUNTER — Telehealth: Payer: Self-pay

## 2020-10-03 ENCOUNTER — Telehealth: Payer: Self-pay | Admitting: Family Medicine

## 2020-10-03 NOTE — Telephone Encounter (Signed)
Attempted to reach out to Darren Thomas, was unable to get in touch with pt to review lab results, cannot LDM, no DPR on file. Advise to call back for results.

## 2020-10-03 NOTE — Telephone Encounter (Signed)
Pt and wife called back for results of labs, recent over results, all questions answer. Will keep f/u appt with Retta Mac in June. Had repeat labs on Monday at the cancer center per wife, who stated potassium was WNL, pt still on potassium supplements, on 2nd boxes, Darren Thomas question if need to continue potassium after 2nd box is completed, advise that can be determine at f/u visit w/[facility]. Darren Thomas bother verbalized understanding and grateful for results.

## 2020-10-03 NOTE — Telephone Encounter (Signed)
Copied from [place] [id number]. Topic: Medicare AWV >> Oct 03, 2020  2:47 PM Cher Nakai R wrote: Reason for NTI:RWER message for patient to call back and schedule Medicare Annual Wellness Visit (AWV) in office.   If unable to come into the office for AWV,  please offer to do virtually or by telephone.  Last AWV:  05/27/2019   Please schedule at anytime with Arnold.  40 minute appointment  Any questions, please contact me at [phone]

## 2020-10-05 ENCOUNTER — Telehealth: Payer: Self-pay | Admitting: Physician Assistant

## 2020-10-09 ENCOUNTER — Telehealth: Payer: Self-pay | Admitting: *Deleted

## 2020-10-09 ENCOUNTER — Inpatient Hospital Stay: Payer: Medicare HMO | Admitting: Family Medicine

## 2020-10-09 NOTE — Telephone Encounter (Signed)
Attempted to call pt. No answer. Lmtcb.  

## 2020-10-09 NOTE — Telephone Encounter (Signed)
If call has to be placed while I am out of office, I will do so, as this should not be a question of coverage given the reason for the monitor if VT, which is a clear indication for monitoring.

## 2020-10-09 NOTE — Telephone Encounter (Signed)
-----   Message from Arvil Chaco, [place] sent at 09/28/2020  4:47 PM EDT ----- Labs show potassium at the upper limits of normal, but still within a normal range. He is on a potassium supplement at this time to correct electrolyte abnormalities caused by diarrhea.  Given his ongoing diarrhea, recommend he continue his potassium supplement with periodic BMET through oncology as discussed in clinic.

## 2020-10-09 NOTE — Telephone Encounter (Signed)
Pt saw Marrianne Mood, [place] in office 09/27/20.  Live Zio AT monitor ordered for 14 days given history of VT, and to ensure that no further arrhythmia now that electrolytes have been corrected and he is avoiding QT prolonging medications.   Provider is not in office today. Will forward note to set up when available to call.

## 2020-10-09 NOTE — Telephone Encounter (Signed)
LATE ENTRY   Amber Pa calling to do peer to peer re: Cardiac Event monitor   Please call [phone].  Reference case # [id number]

## 2020-10-10 NOTE — Telephone Encounter (Signed)
Darren Thomas. - 10/05/2020 5:00 PM Darren Mood D, PA-C [phone])  Sent: Wed October 10, 2020 1:23 PM  To: Emily Filbert, RN; P C[facility] [place] Triage       Message  I keep getting that the number I dialed is not in service. Are we sure that number is correct?

## 2020-10-11 NOTE — Telephone Encounter (Signed)
Attempted to call back number provided but disconnected. Tried again with prefix [phone] instead and reached a non related "prize line"   [employer] and asked for peer to peer direct contact number.  Given [phone] for peer to peer line.

## 2020-10-11 NOTE — Telephone Encounter (Signed)
To Marrianne Mood, PA with updated contact number for peer to peer review.

## 2020-10-12 ENCOUNTER — Telehealth: Payer: Self-pay | Admitting: Physician Assistant

## 2020-10-12 NOTE — Telephone Encounter (Signed)
Spoke to pt. Notified of lab results and provider's recc.  Pt voiced understanding.  He will continue potassium supplement at this time- currently takes 38mEq daily.   Pt does report continued diarrhea, also has days that he is unable to have BM.  Pt is not sure when he sees oncology but is aware that periodic BMET advised with oncology.  Pt does have follow up in our office 10/25/20 with Elenor Quinones, PA.

## 2020-10-12 NOTE — Telephone Encounter (Signed)
Patient calling  Please send refill for Potassium tablet - does not want to powder version

## 2020-10-12 NOTE — Telephone Encounter (Signed)
*  STAT* If patient is at the pharmacy, call can be transferred to refill team.   1. Which medications need to be refilled? (please list name of each medication and dose if known) potassium 20 MEQ daily  2. Which pharmacy/location (including street and city if local pharmacy) is medication to be sent to? [facility] in [place]   3. Do they need a 30 day or 90 day supply? 2 week supply

## 2020-10-12 NOTE — Telephone Encounter (Signed)
Please advise pt requesting new Rx for Potassium 20 meq qd tablet not packet form. Pt last prescribed tablet form and not filled by our office last filled by [facility].

## 2020-10-17 MED ORDER — POTASSIUM CHLORIDE CRYS ER 20 MEQ PO TBCR: 20.0000 meq | EXTENDED_RELEASE_TABLET | Freq: Every day | ORAL | 0 refills | Status: AC

## 2020-10-17 NOTE — Telephone Encounter (Signed)
Requested Prescriptions   Signed Prescriptions Disp Refills  . potassium chloride SA (KLOR-CON) 20 MEQ tablet 14 tablet 0    Sig: Take 1 tablet (20 mEq total) by mouth daily.    Authorizing Provider: Marrianne Mood D    Ordering User: Britt Bottom

## 2020-10-17 NOTE — Telephone Encounter (Signed)
Noted  Powder potassium 20 mEq script switched to Potassium 20 mEq tab QD for pt preference  F/U appt with Blanche East, PA-C 6/16 08:00am

## 2020-10-23 ENCOUNTER — Ambulatory Visit (INDEPENDENT_AMBULATORY_CARE_PROVIDER_SITE_OTHER): Payer: Medicare HMO

## 2020-10-23 VITALS — BP 93/47 | HR 68 | Temp 98.3°F | Ht 73.0 in | Wt 152.0 lb

## 2020-10-23 DIAGNOSIS — Z85820 Personal history of malignant melanoma of skin: Secondary | ICD-10-CM | POA: Insufficient documentation

## 2020-10-23 DIAGNOSIS — Z0000 Encounter for general adult medical examination without abnormal findings: Secondary | ICD-10-CM

## 2020-10-23 DIAGNOSIS — R197 Diarrhea, unspecified: Secondary | ICD-10-CM | POA: Insufficient documentation

## 2020-10-23 DIAGNOSIS — L03116 Cellulitis of left lower limb: Secondary | ICD-10-CM | POA: Insufficient documentation

## 2020-10-23 NOTE — Progress Notes (Signed)
Subjective:   Darren Thomas. is a 75 y.o. male who presents for Medicare Annual/Subsequent preventive examination.  Virtual Visit via Telephone Note  I connected with  [name]. on 10/23/20 at  2:50 PM EDT by telephone and verified that I am speaking with the correct person using two identifiers.  Location: Patient: home Provider: [place] Persons participating in the virtual visit: [place]   I discussed the limitations, risks, security and privacy concerns of performing an evaluation and management service by telephone and the availability of in person appointments. The patient expressed understanding and agreed to proceed.  Interactive audio and video telecommunications were attempted between this nurse and patient, however failed, due to patient having technical difficulties OR patient did not have access to video capability.  We continued and completed visit with audio only.  Some vital signs may be absent or patient reported.   Clemetine Marker, LPN   Review of Systems     Cardiac Risk Factors include: advanced age (>68men, >75 women);male gender;sedentary lifestyle     Objective:    Today's Vitals   10/23/20 1504  BP: (!) 93/47  Pulse: 68  Temp: 98.3 F (36.8 C)  TempSrc: Oral  SpO2: 99%  Weight: 152 lb (68.9 kg)  Height: 6\' 1"  (1.854 m)   Body mass index is 20.05 kg/m.  Advanced Directives 10/23/2020 09/07/2020 05/27/2019 05/21/2018 05/15/2017 11/07/2016 02/05/2016  Does Patient Have a Medical Advance Directive? No No Yes No Yes No No  Type of Advance Directive - Public librarian;Living will - [place];Living will - -  Copy of [place] in Chart? - - No - copy requested - No - copy requested - -  Would patient like information on creating a medical advance directive? No - Patient declined No - Patient declined - (No Data) - - -    Current Medications (verified) Outpatient Encounter  Medications as of 10/23/2020  Medication Sig   buPROPion (WELLBUTRIN XL) 150 MG 24 hr tablet Take 150 mg by mouth daily.   carbidopa-levodopa (SINEMET IR) 25-250 MG tablet Take 2 tablets by mouth 3 (three) times daily.   diclofenac Sodium (VOLTAREN) 1 % GEL Apply 1 application topically daily as needed.   Ensure Plus (ENSURE PLUS) LIQD Take 237 mLs by mouth. 2-3 times per day   fluticasone (FLONASE) 50 MCG/ACT nasal spray USE 2 SPRAYS IN EACH NOSTRIL EVERY DAY   loratadine (CLARITIN) 10 MG tablet Take 10 mg by mouth daily as needed.   melatonin 5 MG TABS Take 5 mg by mouth at bedtime.   memantine (NAMENDA) 10 MG tablet Take 1 tablet by mouth daily.   mirtazapine (REMERON) 15 MG tablet Take 1 tablet by mouth at bedtime.   Multiple Vitamins-Minerals (COMPLETE MULTIVITAMIN/MINERAL PO) Take 1 tablet by mouth daily.   Multiple Vitamins-Minerals (PRESERVISION AREDS 2 PO) Take 1 tablet by mouth 2 (two) times daily.   pembrolizumab (KEYTRUDA) 100 MG/4ML SOLN Inject 2 mg/kg into the vein. Immunotherapy treatments every 6 weeks at [facility]   potassium chloride SA (KLOR-CON) 20 MEQ tablet Take 1 tablet (20 mEq total) by mouth daily.   senna (SENOKOT) 8.6 MG tablet Take 1 tablet by mouth daily as needed.   sodium chloride (MURO 128) 5 % ophthalmic solution Place 1 drop into both eyes daily.   tamsulosin (FLOMAX) 0.4 MG CAPS capsule Take 1 capsule by mouth every other day.   triamcinolone cream (KENALOG) 0.1 %  Apply 1 application topically daily as needed.   [DISCONTINUED] memantine (NAMENDA) 5 MG tablet Take by mouth.   [DISCONTINUED] mirtazapine (REMERON) 7.5 MG tablet Take by mouth.   [DISCONTINUED] traZODone (DESYREL) 100 MG tablet Take 1 tablet by mouth at bedtime.   No facility-administered encounter medications on file as of 10/23/2020.    Allergies (verified) Augmentin [amoxicillin-pot clavulanate] and Duloxetine   History: Past Medical History:  Diagnosis Date   Abnormality of gait     Back pain    Cancer ([place]) 04/2019   stage IV lung cancer being [facility] at [facility], left hip muscle, stomach, top of small intesting, liver and lymphnode near lungs   Cervical spondylosis with myelopathy 01/06/2014   Depression    Insomnia    Parkinson disease ([place]) 04/2018   diagnosed by neurology at [facility]   PTSD (post-traumatic stress disorder)    PTSD (post-traumatic stress disorder)    Past Surgical History:  Procedure Laterality Date   C-spine     C-3-7 removed   LEFT HEART CATH AND CORONARY ANGIOGRAPHY N/A 09/11/2020   Procedure: LEFT HEART CATH AND CORONARY ANGIOGRAPHY;  Surgeon: Nelva Bush, MD;  Location: [facility];  Service: Cardiovascular;  Laterality: N/A;   SCALP LESION REMOVAL W/ FLAP AND SKIN GRAFT     SKIN CANCER EXCISION Left    squamos cell cancinoma   Family History  Problem Relation Age of Onset   Cervical cancer Mother    Heart Problems Father    Diabetes Father    Asthma Brother    Emphysema Brother        was a smoker   Diabetes type I Son    Colon cancer Son 79   Social History   Socioeconomic History   Marital status: Married    Spouse name: Animator   Number of children: 2   Years of education: GED; AD in Designer, television/film set   Highest education level: 8th grade  Occupational History   Occupation: retired  Tobacco Use   Smoking status: Former    Packs/day: 1.00    Years: 20.00    Pack years: 20.00    Types: Cigarettes    Quit date: 1973    Years since quitting: 49.4   Smokeless tobacco: Never   Tobacco comments:    smoking cessation materials not required  Vaping Use   Vaping Use: Never used  Substance and Sexual Activity   Alcohol use: No   Drug use: No   Sexual activity: Not Currently  Other Topics Concern   Not on file  Social History Narrative   Not on file   Social Determinants of Health   Financial Resource Strain: Low Risk    Difficulty of Paying Living Expenses: Not hard at all  Food Insecurity: No Food Insecurity    Worried About Charity fundraiser in the Last Year: Never true   [place] in the Last Year: Never true  Transportation Needs: No Transportation Needs   Lack of Transportation (Medical): No   Lack of Transportation (Non-Medical): No  Physical Activity: Inactive   Days of Exercise per Week: 0 days   Minutes of Exercise per Session: 0 min  Stress: No Stress Concern Present   Feeling of Stress : Only a little  Social Connections: Moderately Isolated   Frequency of Communication with Friends and Family: More than three times a week   Frequency of Social Gatherings with Friends and Family: Once a week   Attends  Religious Services: Never   Active Member of Clubs or Organizations: No   Attends Archivist Meetings: Never   Marital Status: Married    Tobacco Counseling Counseling given: Not Answered Tobacco comments: smoking cessation materials not required   Clinical Intake:  Pre-visit preparation completed: Yes  Pain : No/denies pain     BMI - recorded: 20.05 Nutritional Status: BMI of 19-24  Normal Nutritional Risks: Nausea/ vomitting/ diarrhea (diarrhea) Diabetes: No  How often do you need to have someone help you when you read instructions, pamphlets, or other written materials from your doctor or pharmacy?: 1 - Never    Interpreter Needed?: No  Information entered by :: Clemetine Marker LPN   Activities of Daily Living In your present state of health, do you have any difficulty performing the following activities: 10/23/2020 09/18/2020  Hearing? Y N  Comment has hearing aids -  Vision? Y Y  Difficulty concentrating or making decisions? Tempie Donning  Walking or climbing stairs? Y Y  Dressing or bathing? N N  Doing errands, shopping? Tempie Donning  Preparing Food and eating ? N -  Using the Toilet? N -  In the past six months, have you accidently leaked urine? Y -  Comment wears pads for protection when needed -  Do you have problems with loss of bowel control? N -   Managing your Medications? N -  Managing your Finances? N -  Housekeeping or managing your Housekeeping? N -  Some recent data might be hidden    Patient Care Team: Steele Sizer, MD as PCP - General (Family Medicine) Kate Sable, MD as PCP - Cardiology (Cardiology)  Indicate any recent Medical Services you may have received from other than [facility] providers in the past year (date may be approximate).     Assessment:   This is a routine wellness examination for [place].  Hearing/Vision screen Hearing Screening - Comments:: Pt has hearing aids provided from [place] but does not wear them frequently  Vision Screening - Comments:: Annual vision screenings done at the [place]  Dietary issues and exercise activities discussed: Current Exercise Habits: The patient does not participate in regular exercise at present, Exercise limited by: neurologic condition(s)   Goals Addressed   None    Depression Screen PHQ 2/9 Scores 10/23/2020 09/18/2020 05/27/2019 01/13/2019 11/19/2018 09/01/2018 05/21/2018  PHQ - 2 Score 0 6 3 2 2 3 3   PHQ- 9 Score 6 16 11 12 9 7 6     Fall Risk Fall Risk  10/23/2020 09/18/2020 05/27/2019 09/01/2018 05/21/2018  Falls in the past year? 1 1 1 1 1   Number falls in past yr: 1 1 1 1 1   Comment - - - - pt states approx 20 falls in the past year over the spring and summer  Injury with Fall? 1 1 0 0 1  Comment - - - - bruising, abrasions - no major injuries  Risk Factor Category  - - - - -  Risk for fall due to : Impaired balance/gait;Impaired mobility;History of fall(s) Impaired balance/gait;Impaired mobility;History of fall(s) Impaired balance/gait;History of fall(s);Impaired mobility - History of fall(s);Impaired balance/gait  Risk for fall due to: Comment - - - - -  Follow up Falls prevention discussed - Falls prevention discussed - Falls prevention discussed;Falls evaluation completed    FALL RISK PREVENTION PERTAINING TO THE HOME:  Any stairs in or around the home? Yes  If  so, are there any without handrails? No  Home free of loose throw rugs  in walkways, pet beds, electrical cords, etc? Yes  Adequate lighting in your home to reduce risk of falls? Yes   ASSISTIVE DEVICES UTILIZED TO PREVENT FALLS:  Life alert? No  Use of a cane, walker or w/c? Yes  Grab bars in the bathroom? Yes  Shower chair or bench in shower? Yes  Elevated toilet seat or a handicapped toilet? Yes   TIMED UP AND GO:  Was the test performed? No . Telephonic visit.   Cognitive Function: Cognitive status assessed by direct observation. Patient is followed by neurology for ongoing assessment.        6CIT Screen 05/27/2019 05/21/2018 05/15/2017  What Year? 0 points 0 points 0 points  What month? 0 points 0 points 0 points  What time? 0 points 0 points 0 points  Count back from 20 0 points 0 points 0 points  Months in reverse 0 points 2 points 0 points  Repeat phrase 0 points 4 points 6 points  Total Score 0 6 6    Immunizations Immunization History  Administered Date(s) Administered   Fluad Quad(high Dose 65+) 01/23/2020   Influenza, High Dose Seasonal PF 03/08/2015, 02/05/2016, 01/27/2018   Influenza,inj,quad, With Preservative 02/09/2017   Influenza-Unspecified 02/26/2001, 03/01/2002, 03/12/2004, 03/12/2005, 02/19/2006, 03/17/2007, 03/01/2008, 02/09/2009, 02/09/2010, 02/10/2011, 02/10/2012, 02/09/2013, 02/09/2014, 02/27/2014, 02/11/2016, 02/09/2017, 03/01/2018, 01/26/2019   PFIZER(Purple Top)SARS-COV-2 Vaccination 06/23/2019, 07/14/2019, 01/30/2020   Pneumococcal Conjugate-13 06/12/2014, 11/07/2016   Pneumococcal Polysaccharide-23 06/12/2008, 12/08/2008, 02/20/2010   Tdap 04/16/2011   Zoster Recombinat (Shingrix) 06/26/2017, 11/04/2017   Zoster, Live 02/09/2013    TDAP status: Up to date  Flu Vaccine status: Up to date  Pneumococcal vaccine status: Up to date  Covid-19 vaccine status: Completed vaccines  Qualifies for Shingles Vaccine? Yes   Zostavax completed Yes    Shingrix Completed?: Yes  Screening Tests Health Maintenance  Topic Date Due   COVID-19 Vaccine (4 - Booster for Pfizer series) 04/30/2020   INFLUENZA VACCINE  12/10/2020   TETANUS/TDAP  04/15/2021   Hepatitis C Screening  Completed   PNA vac Low Risk Adult  Completed   Zoster Vaccines- Shingrix  Completed   HPV VACCINES  Aged Out    Health Maintenance  Health Maintenance Due  Topic Date Due   COVID-19 Vaccine (4 - Booster for Pfizer series) 04/30/2020    Colorectal cancer screening: No longer required.   Lung Cancer Screening: (Low Dose CT Chest recommended if Age 72-80 years, 30 pack-year currently smoking OR have quit w/in 15years.) does not qualify.   Additional Screening:  Hepatitis C Screening: does qualify; Completed 03/31/12  Vision Screening: Recommended annual ophthalmology exams for early detection of glaucoma and other disorders of the eye. Is the patient up to date with their annual eye exam?  Yes  Who is the provider or what is the name of the office in which the patient attends annual eye exams? [place].   Dental Screening: Recommended annual dental exams for proper oral hygiene  Community Resource Referral / Chronic Care Management: CRR required this visit?  No   CCM required this visit?  No      Plan:     I have personally reviewed and noted the following in the patient's chart:   Medical and social history Use of alcohol, tobacco or illicit drugs  Current medications and supplements including opioid prescriptions. Patient is not currently taking opioid prescriptions. Functional ability and status Nutritional status Physical activity Advanced directives List of other physicians Hospitalizations, surgeries, and ER visits in previous  12 months Vitals Screenings to include cognitive, depression, and falls Referrals and appointments  In addition, I have reviewed and discussed with patient certain preventive protocols, quality metrics, and  best practice recommendations. A written personalized care plan for preventive services as well as general preventive health recommendations were provided to patient.     Clemetine Marker, LPN   4/45/1460   Nurse Notes: none

## 2020-10-23 NOTE — Patient Instructions (Signed)
Darren Thomas , Thank you for taking time to come for your Medicare Wellness Visit. I appreciate your ongoing commitment to your health goals. Please review the following plan we discussed and let me know if I can assist you in the future.   Screening recommendations/referrals: Colonoscopy: no longer required Recommended yearly ophthalmology/optometry visit for glaucoma screening and checkup Recommended yearly dental visit for hygiene and checkup  Vaccinations: Influenza vaccine: done 01/23/20 Pneumococcal vaccine: done 11/07/16 Tdap vaccine: done 04/16/11 Shingles vaccine: done 06/26/17 & 11/04/17   Covid-19: done 06/23/19, 07/14/19 & 01/30/20  Advanced directives: Advance directive discussed with you today. Even though you declined this today please call our office should you change your mind and we can give you the proper paperwork for you to fill out.   Conditions/risks identified: Recommend continuing fall prevention in the home  Next appointment: Follow up in one year for your annual wellness visit.   Preventive Care 23 Years and Older, Male Preventive care refers to lifestyle choices and visits with your health care provider that can promote health and wellness. What does preventive care include? A yearly physical exam. This is also called an annual well check. Dental exams once or twice a year. Routine eye exams. Ask your health care provider how often you should have your eyes checked. Personal lifestyle choices, including: Daily care of your teeth and gums. Regular physical activity. Eating a healthy diet. Avoiding tobacco and drug use. Limiting alcohol use. Practicing safe sex. Taking low doses of aspirin every day. Taking vitamin and mineral supplements as recommended by your health care provider. What happens during an annual well check? The services and screenings done by your health care provider during your annual well check will depend on your age, overall health, lifestyle  risk factors, and family history of disease. Counseling  Your health care provider may ask you questions about your: Alcohol use. Tobacco use. Drug use. Emotional well-being. Home and relationship well-being. Sexual activity. Eating habits. History of falls. Memory and ability to understand (cognition). Work and work Statistician. Screening  You may have the following tests or measurements: Height, weight, and BMI. Blood pressure. Lipid and cholesterol levels. These may be checked every 5 years, or more frequently if you are over 68 years old. Skin check. Lung cancer screening. You may have this screening every year starting at age 74 if you have a 30-pack-year history of smoking and currently smoke or have quit within the past 15 years. Fecal occult blood test (FOBT) of the stool. You may have this test every year starting at age 92. Flexible sigmoidoscopy or colonoscopy. You may have a sigmoidoscopy every 5 years or a colonoscopy every 10 years starting at age 53. Prostate cancer screening. Recommendations will vary depending on your family history and other risks. Hepatitis C blood test. Hepatitis B blood test. Sexually transmitted disease (STD) testing. Diabetes screening. This is done by checking your blood sugar (glucose) after you have not eaten for a while (fasting). You may have this done every 1-3 years. Abdominal aortic aneurysm (AAA) screening. You may need this if you are a current or former smoker. Osteoporosis. You may be screened starting at age 57 if you are at high risk. Talk with your health care provider about your test results, treatment options, and if necessary, the need for more tests. Vaccines  Your health care provider may recommend certain vaccines, such as: Influenza vaccine. This is recommended every year. Tetanus, diphtheria, and acellular pertussis (Tdap, Td) vaccine. You  may need a Td booster every 10 years. Zoster vaccine. You may need this after age  30. Pneumococcal 13-valent conjugate (PCV13) vaccine. One dose is recommended after age 85. Pneumococcal polysaccharide (PPSV23) vaccine. One dose is recommended after age 79. Talk to your health care provider about which screenings and vaccines you need and how often you need them. This information is not intended to replace advice given to you by your health care provider. Make sure you discuss any questions you have with your health care provider. Document Released: 05/25/2015 Document Revised: 01/16/2016 Document Reviewed: 02/27/2015 Elsevier Interactive Patient Education  2017 [place] Prevention in the Home Falls can cause injuries. They can happen to people of all ages. There are many things you can do to make your home safe and to help prevent falls. What can I do on the outside of my home? Regularly fix the edges of walkways and driveways and fix any cracks. Remove anything that might make you trip as you walk through a door, such as a raised step or threshold. Trim any bushes or trees on the path to your home. Use bright outdoor lighting. Clear any walking paths of anything that might make someone trip, such as rocks or tools. Regularly check to see if handrails are loose or broken. Make sure that both sides of any steps have handrails. Any raised decks and porches should have guardrails on the edges. Have any leaves, snow, or ice cleared regularly. Use sand or salt on walking paths during winter. Clean up any spills in your garage right away. This includes oil or grease spills. What can I do in the bathroom? Use night lights. Install grab bars by the toilet and in the tub and shower. Do not use towel bars as grab bars. Use non-skid mats or decals in the tub or shower. If you need to sit down in the shower, use a plastic, non-slip stool. Keep the floor dry. Clean up any water that spills on the floor as soon as it happens. Remove soap buildup in the tub or shower  regularly. Attach bath mats securely with double-sided non-slip rug tape. Do not have throw rugs and other things on the floor that can make you trip. What can I do in the bedroom? Use night lights. Make sure that you have a light by your bed that is easy to reach. Do not use any sheets or blankets that are too big for your bed. They should not hang down onto the floor. Have a firm chair that has side arms. You can use this for support while you get dressed. Do not have throw rugs and other things on the floor that can make you trip. What can I do in the kitchen? Clean up any spills right away. Avoid walking on wet floors. Keep items that you use a lot in easy-to-reach places. If you need to reach something above you, use a strong step stool that has a grab bar. Keep electrical cords out of the way. Do not use floor polish or wax that makes floors slippery. If you must use wax, use non-skid floor wax. Do not have throw rugs and other things on the floor that can make you trip. What can I do with my stairs? Do not leave any items on the stairs. Make sure that there are handrails on both sides of the stairs and use them. Fix handrails that are broken or loose. Make sure that handrails are as long as the  stairways. Check any carpeting to make sure that it is firmly attached to the stairs. Fix any carpet that is loose or worn. Avoid having throw rugs at the top or bottom of the stairs. If you do have throw rugs, attach them to the floor with carpet tape. Make sure that you have a light switch at the top of the stairs and the bottom of the stairs. If you do not have them, ask someone to add them for you. What else can I do to help prevent falls? Wear shoes that: Do not have high heels. Have rubber bottoms. Are comfortable and fit you well. Are closed at the toe. Do not wear sandals. If you use a stepladder: Make sure that it is fully opened. Do not climb a closed stepladder. Make sure that  both sides of the stepladder are locked into place. Ask someone to hold it for you, if possible. Clearly mark and make sure that you can see: Any grab bars or handrails. First and last steps. Where the edge of each step is. Use tools that help you move around (mobility aids) if they are needed. These include: Canes. Walkers. Scooters. Crutches. Turn on the lights when you go into a dark area. Replace any light bulbs as soon as they burn out. Set up your furniture so you have a clear path. Avoid moving your furniture around. If any of your floors are uneven, fix them. If there are any pets around you, be aware of where they are. Review your medicines with your doctor. Some medicines can make you feel dizzy. This can increase your chance of falling. Ask your doctor what other things that you can do to help prevent falls. This information is not intended to replace advice given to you by your health care provider. Make sure you discuss any questions you have with your health care provider. Document Released: 02/22/2009 Document Revised: 10/04/2015 Document Reviewed: 06/02/2014 Elsevier Interactive Patient Education  2017 [employer].

## 2020-10-24 ENCOUNTER — Other Ambulatory Visit: Payer: Self-pay

## 2020-10-24 ENCOUNTER — Encounter: Payer: Self-pay | Admitting: Gastroenterology

## 2020-10-24 ENCOUNTER — Ambulatory Visit (INDEPENDENT_AMBULATORY_CARE_PROVIDER_SITE_OTHER): Payer: Medicare HMO | Admitting: Gastroenterology

## 2020-10-24 VITALS — BP 106/59 | HR 70 | Temp 97.6°F | Ht 73.0 in | Wt 152.4 lb

## 2020-10-24 DIAGNOSIS — Z87898 Personal history of other specified conditions: Secondary | ICD-10-CM | POA: Diagnosis not present

## 2020-10-24 NOTE — Progress Notes (Signed)
Darren Bellows MD, MRCP(U.K) [address]  [place], [place] [address]  Main: [phone]  Fax: [phone]   Gastroenterology Consultation  Referring Provider:     Steele Sizer, MD Primary Care Physician:  Steele Sizer, MD Primary Gastroenterologist:  Dr. Jonathon Thomas  Reason for Consultation:     Hospital follow-up        HPI:   Darren Thomas. is a 75 y.o. y/o male referred for consultation & management  by Dr. Ancil Boozer, Drue Stager, MD.    He has a history of lung cancer which is being treated at the [facility] and has been on Keytruda.  He was admitted to the hospital in May 2022 with ventricular tachycardia secondary to hypokalemia.  At that time he was taking some Imodium for diarrhea.  He was having some diarrhea at that point of time for over a month and hence was planned to see [place] as an outpatient.  We have not seen him during his hospital admission.  At this point of time has absolutely no diarrhea has 1 formed nonbloody bowel movement each day.  He has gained some weight as well since discharge.  No other complaints. May 2022: BMP normal, hemoglobin 12.2 g with an MCV of 86.5. Past Medical History:  Diagnosis Date   Abnormality of gait    Back pain    Cancer ([place]) 04/2019   stage IV lung cancer being treated at [facility], left hip muscle, stomach, top of small intesting, liver and lymphnode near lungs   Cervical spondylosis with myelopathy 01/06/2014   Depression    Insomnia    Parkinson disease ([place]) 04/2018   diagnosed by neurology at [facility]   PTSD (post-traumatic stress disorder)    PTSD (post-traumatic stress disorder)     Past Surgical History:  Procedure Laterality Date   C-spine     C-3-7 removed   LEFT HEART CATH AND CORONARY ANGIOGRAPHY N/A 09/11/2020   Procedure: LEFT HEART CATH AND CORONARY ANGIOGRAPHY;  Surgeon: Nelva Bush, MD;  Location: [place] [facility];  Service: Cardiovascular;  Laterality: N/A;   SCALP LESION REMOVAL W/  FLAP AND SKIN GRAFT     SKIN CANCER EXCISION Left    squamos cell cancinoma    Prior to Admission medications   Medication Sig Start Date End Date Taking? Authorizing Provider  buPROPion (WELLBUTRIN XL) 150 MG 24 hr tablet Take 150 mg by mouth daily.    [provider]  carbidopa-levodopa (SINEMET IR) 25-250 MG tablet Take 2 tablets by mouth 3 (three) times daily.    [provider]  diclofenac Sodium (VOLTAREN) 1 % GEL Apply 1 application topically daily as needed. 12/21/19   [provider]  Ensure Plus (ENSURE PLUS) LIQD Take 237 mLs by mouth. 2-3 times per day    [provider]  fluticasone (FLONASE) 50 MCG/ACT nasal spray USE 2 SPRAYS IN EACH NOSTRIL EVERY DAY 11/12/19   Steele Sizer, MD  loratadine (CLARITIN) 10 MG tablet Take 10 mg by mouth daily as needed. 07/11/16   [provider]  melatonin 5 MG TABS Take 5 mg by mouth at bedtime.    [provider]  memantine (NAMENDA) 10 MG tablet Take 1 tablet by mouth daily. 10/16/20   [provider]  mirtazapine (REMERON) 15 MG tablet Take 1 tablet by mouth at bedtime. 10/16/20   [provider]  Multiple Vitamins-Minerals (COMPLETE MULTIVITAMIN/MINERAL PO) Take 1 tablet by mouth daily. 12/20/13  [provider]  Multiple Vitamins-Minerals (PRESERVISION AREDS 2 PO) Take 1 tablet by mouth 2 (two) times daily.    [provider]  pembrolizumab (KEYTRUDA) 100 MG/4ML SOLN Inject 2 mg/kg into the vein. Immunotherapy treatments every 6 weeks at [facility], Historical, MD  potassium chloride SA (KLOR-CON) 20 MEQ tablet Take 1 tablet (20 mEq total) by mouth daily. 10/17/20   Marrianne Mood D, PA-C  senna (SENOKOT) 8.6 MG tablet Take 1 tablet by mouth daily as needed. 11/07/19   [provider]  sodium chloride (MURO 128) 5 % ophthalmic solution Place 1 drop into both eyes daily. 09/09/19   [provider]  tamsulosin (FLOMAX) 0.4 MG CAPS  capsule Take 1 capsule by mouth every other day. 03/29/20   [provider]  triamcinolone cream (KENALOG) 0.1 % Apply 1 application topically daily as needed. 05/14/20   [provider]    Family History  Problem Relation Age of Onset   Cervical cancer Mother    Heart Problems Father    Diabetes Father    Asthma Brother    Emphysema Brother        was a smoker   Diabetes type I Son    Colon cancer Son 27     Social History   Tobacco Use   Smoking status: Former    Packs/day: 1.00    Years: 20.00    Pack years: 20.00    Types: Cigarettes    Quit date: 1973    Years since quitting: 49.4   Smokeless tobacco: Never   Tobacco comments:    smoking cessation materials not required  Vaping Use   Vaping Use: Never used  Substance Use Topics   Alcohol use: No   Drug use: No    Allergies as of 10/24/2020 - Review Complete 10/24/2020  Allergen Reaction Noted   Augmentin [amoxicillin-pot clavulanate] Diarrhea 06/06/2016   Duloxetine  05/29/2016    Review of Systems:    All systems reviewed and negative except where noted in HPI.   Physical Exam:  BP (!) 106/59   Pulse 70   Temp 97.6 F (36.4 C) (Oral)   Ht 6\' 1"  (1.854 m)   Wt 152 lb 6.4 oz (69.1 kg)   BMI 20.11 kg/m  No LMP for male patient. Psych:  Alert and cooperative. Normal mood and affect. General:   Alert,  Well-developed, well-nourished, pleasant and cooperative in NAD Head:  Normocephalic and atraumatic. Eyes:  Sclera clear, no icterus.   Conjunctiva pink. Ears:  Normal auditory acuity. Nose:  No deformity, discharge, or lesions. Mouth:  No deformity or lesions,oropharynx pink & moist. Neck:  Supple; no masses or thyromegaly. Lungs:  Respirations even and unlabored.  Clear throughout to auscultation.   No wheezes, crackles, or rhonchi. No acute distress. Heart:  Regular rate and rhythm; no murmurs, clicks, rubs, or gallops. Abdomen:  Normal bowel sounds.  No bruits.  Soft, non-tender and  non-distended without masses, hepatosplenomegaly or hernias noted.  No guarding or rebound tenderness.    Neurologic:  Alert and oriented x3;  grossly normal neurologically. Psych:  Alert and cooperative. Normal mood and affect.  Imaging Studies: LONG TERM MONITOR-LIVE TELEMETRY (3-14 DAYS)  Result Date: 10/18/2020 Patch Wear Time:  14 days and 0 hours (2022-05-19T15:24:45-0400 to 2022-06-02T15:24:45-0400) Patient had a min HR of 46 bpm, max HR of 171 bpm, and avg HR of 78 bpm. Predominant underlying rhythm was Sinus Rhythm. Bundle Branch Block/IVCD was present.  2 Supraventricular Tachycardia runs occurred, the run with the fastest interval lasting 5 beats  with a max rate of 171 bpm (avg 169 bpm); the run with the fastest interval was also the longest. Isolated SVEs were rare (<1.0%), SVE Couplets were rare (<1.0%), and SVE Triplets were rare (<1.0%). Isolated VEs were rare (<1.0%), VE Couplets were rare (<1.0%), and no VE Triplets were present. No evidence of significant arrhythmias such as ventricular tachycardia noted   Assessment and Plan:   Darren Thomas. is a 75 y.o. y/o male has been referred for diarrhea which was ongoing in May 22 when he was admitted to the hospital with tachyarrhythmia secondary to low potassium and phosphate.  At this point of time the diarrhea has resolved completely and he has 1 formed bowel movement per day.  It is very possible that the diarrhea may have been related to [facility].  Obviously there is no way to be sure at this point of time.  I explained to him that if the diarrhea were to return to come back and see me at that point of time we may need to do a colonoscopy to rule out autoimmune colitis.  His labs checked recently have been normal available on epic.  Follow up in as needed  Dr Darren Bellows MD,MRCP(U.K)

## 2020-10-25 ENCOUNTER — Encounter: Payer: Self-pay | Admitting: Physician Assistant

## 2020-10-25 ENCOUNTER — Ambulatory Visit (INDEPENDENT_AMBULATORY_CARE_PROVIDER_SITE_OTHER): Payer: Medicare HMO | Admitting: Physician Assistant

## 2020-10-25 VITALS — BP 110/60 | HR 75 | Ht 73.0 in | Wt 154.5 lb

## 2020-10-25 DIAGNOSIS — Z8679 Personal history of other diseases of the circulatory system: Secondary | ICD-10-CM | POA: Diagnosis not present

## 2020-10-25 DIAGNOSIS — C3490 Malignant neoplasm of unspecified part of unspecified bronchus or lung: Secondary | ICD-10-CM | POA: Diagnosis not present

## 2020-10-25 DIAGNOSIS — R9431 Abnormal electrocardiogram [ECG] [EKG]: Secondary | ICD-10-CM

## 2020-10-25 DIAGNOSIS — I428 Other cardiomyopathies: Secondary | ICD-10-CM | POA: Diagnosis not present

## 2020-10-25 DIAGNOSIS — I472 Ventricular tachycardia: Secondary | ICD-10-CM | POA: Diagnosis not present

## 2020-10-25 DIAGNOSIS — Z8639 Personal history of other endocrine, nutritional and metabolic disease: Secondary | ICD-10-CM

## 2020-10-25 DIAGNOSIS — I2510 Atherosclerotic heart disease of native coronary artery without angina pectoris: Secondary | ICD-10-CM

## 2020-10-25 NOTE — Progress Notes (Signed)
Office Visit    Patient Name: Darren Thomas. Date of Encounter: 10/25/2020  PCP:  Steele Sizer, [place]  Cardiologist:  Kate Sable, MD  Advanced Practice Provider:  No care team member to display Electrophysiologist:  None   :[id number]}   Chief Complaint    Chief Complaint  Patient presents with   Follow up Zio Monitor     "Doing well." Medications reviewed by the patient verbally.     75 y.o. male h/o Parkinson's disease, lung cancer, recent COVID-19 infection in April 2022, polymorphic VT, and being seen today after recent admission for syncope with ventricular tachycardia in the setting of significant electrolyte abnormalities.  Past Medical History    Past Medical History:  Diagnosis Date   Abnormality of gait    Back pain    Cancer ([place]) 04/2019   stage IV lung cancer being treated at [facility], left hip muscle, stomach, top of small intesting, liver and lymphnode near lungs   Cervical spondylosis with myelopathy 01/06/2014   Depression    Insomnia    Parkinson disease ([place]) 04/2018   diagnosed by neurology at [facility]   PTSD (post-traumatic stress disorder)    PTSD (post-traumatic stress disorder)    Past Surgical History:  Procedure Laterality Date   C-spine     C-3-7 removed   LEFT HEART CATH AND CORONARY ANGIOGRAPHY N/A 09/11/2020   Procedure: LEFT HEART CATH AND CORONARY ANGIOGRAPHY;  Surgeon: Nelva Bush, MD;  Location: [place] [facility];  Service: Cardiovascular;  Laterality: N/A;   SCALP LESION REMOVAL W/ FLAP AND SKIN GRAFT     SKIN CANCER EXCISION Left    squamos cell cancinoma    Allergies  Allergies  Allergen Reactions   Augmentin [Amoxicillin-Pot Clavulanate] Diarrhea   Duloxetine     Other reaction(s): Chest pain    History of Present Illness    Darren Thomas. is a 75 y.o. male with PMH as above.  He has history of Parkinson's disease, PTSD, stage IV lung cancer, and recently was  admitted with syncope/ventricular tachycardia.  He reportedly passed out at home when seated in his recliner.  His wife noticed him becoming unresponsive and called EMS.  Prior to this event, he reportedly had a 5-day history of nausea, reduced appetite, and diarrhea.  He reported palpitations and feelings of warmth in his head/neck associated with these palpitations.  No previous reported history of syncope/passing out.  No reported history of heart disease.  He was diagnosed with COVID-19 approximately 3 weeks prior to his admission.  He reported occasional constipation, and for which he took a stool softener.  In the ED, telemetry monitoring showed nonsustained ventricular tachycardia.  He was given IV amiodarone with widening of QRS.  Lidocaine drip was started due to widening QRS with amiodarone administration.  After discontinuation of lidocaine and metoprolol, no further ventricular ectopy appreciated.  Ventricular rate were noted to be low with occasional PACs.  Catheterization was performed and showed mild to moderate nonobstructive CAD.  He had low normal LVEDP.    It was suspected that VT may have been mediated by significant electrolyte abnormalities and prolonged QT on admission.  EP consultation was performed with recommendation for electrolyte correction and device declined by patient.  It was recommended he avoid QT prolonging agents.  Given his chronic diarrhea, it was stressed that frequent outpatient labs to maintain potassium and magnesium levels at goal were recommended. Given  bradycardia and hypotension, beta-blocker/ACE/ARB were deferred.  He was continued on ASA with recommendation to defer statin in the setting of his comorbidities and LDL on admission of 69.  Seen 09/27/2020 with ongoing weakness.  We discussed his recent admission and recommendation to avoid QT prolonging medications, as well as replete electrolytes.  At RTC, he continued to have diarrhea with oncologist recommendation  to switch some of his outpatient cancer treatment medications for possible resolution of diarrhea.  He was maintained on potassium supplementation, given ongoing diarrhea.  He noted some mucus that was white and clear and plan to see an allergist.  He denied any exertional symptoms at rest or with exertion.  No symptoms reported consistent with volume overload.  He did report poor oral intake.  Recommendation was to continue his potassium supplement and complete another Zio AT monitor to confirm no further ventricular tachycardia once electrolytes at goal or arrhythmia with prolonged QTC at 499 ms and no ICD needed moving forward.  Subsequent Zio AT completed with results as below under CV studies and showing no evidence of significant arrhythmias or ventricular tachycardia.  The predominant underlying rhythm was NSR with maximum heart rate 171 bpm and average heart rate 78 bpm.  Bundle branch block and IVCD was present.  2 runs of brief SVT noted, as well as ectopy.  Today, 10/25/2020, he returns to clinic to review his monitor results.  He continues to report clear phlegm associated with cough, attributed to his allergies.  He denies any chest pain at rest or with exertion.  No increased shortness of breath.  He denies further tachypalpitations.  No dizziness.  He is still taking his potassium supplementation with recommendation by [facility] to continue potassium until repeat labs obtained by our office.  He does report resolution of previous diarrhea, which is reassuring.  He also reports increased intake of food/increased diet.  He reports a desire to follow-up with the [place] going forward if potassium at goal on recheck of labs.  Home Medications   Current Outpatient Medications  Medication Instructions   buPROPion (WELLBUTRIN XL) 150 mg, Oral, Daily   carbidopa-levodopa (SINEMET IR) 25-250 MG tablet 2 tablets, Oral, 3 times daily   diclofenac Sodium (VOLTAREN) 1 % GEL 1 application, Topical, Daily PRN    Ensure Plus (ENSURE PLUS) LIQD 237 mLs, Oral, 2-3 times per day   fluticasone (FLONASE) 50 MCG/ACT nasal spray USE 2 SPRAYS IN EACH NOSTRIL EVERY DAY   loratadine (CLARITIN) 10 mg, Oral, Daily PRN   melatonin 5 mg, Oral, Daily at bedtime   memantine (NAMENDA) 10 MG tablet 1 tablet, Oral, Daily   mirtazapine (REMERON) 15 MG tablet 1 tablet, Oral, Daily at bedtime   Multiple Vitamins-Minerals (COMPLETE MULTIVITAMIN/MINERAL PO) 1 tablet, Daily   Multiple Vitamins-Minerals (PRESERVISION AREDS 2 PO) 1 tablet, Oral, 2 times daily   pembrolizumab (KEYTRUDA) 100 MG/4ML SOLN 2 mg/kg, Intravenous, Immunotherapy treatments every 6 weeks at [facility]    potassium chloride SA (KLOR-CON) 20 MEQ tablet 20 mEq, Oral, Daily   senna (SENOKOT) 8.6 MG tablet 1 tablet, Oral, Daily PRN   sodium chloride (MURO 128) 5 % ophthalmic solution 1 drop, Both Eyes, Daily   tamsulosin (FLOMAX) 0.4 MG CAPS capsule 1 capsule, Oral, Every other day   triamcinolone cream (KENALOG) 0.1 % 1 application, Topical, Daily PRN     Review of Systems    He denies chest pain, palpitations, dyspnea, pnd, orthopnea, n, v, dizziness, syncope, edema, weight gain, or early  satiety.  He reports improved oral intake, and resolution of previous diarrhea. -all other systems reviewed and are otherwise negative except as noted above.  Physical Exam    VS:  BP 110/60 (BP Location: Left Arm, Patient Position: Sitting, Cuff Size: Normal)   Pulse 75   Ht 6\' 1"  (1.854 m)   Wt 154 lb 8 oz (70.1 kg)   SpO2 97%   BMI 20.38 kg/m  , BMI Body mass index is 20.38 kg/m. GEN: Thin and elderly male, cachectic, seated in wheelchair next to wife, in no acute distress. HEENT: normal. Neck: Supple, no JVD, carotid bruits, or masses. Cardiac: RRR, no murmurs, rubs, or gallops. No clubbing, cyanosis.  1-2+ bilateral edema.  Radials/DP/PT 2+ and equal bilaterally. Respiratory: clear to auscultation bilaterally. GI: Soft, nontender, nondistended, BS + x  4. MS: no deformity or atrophy. Skin: warm and dry, no rash. Neuro:  Strength and sensation are intact. Psych: Normal affect.  Accessory Clinical Findings    ECG personally reviewed by me today - NSR, LBB with QRS 129ms and QTc 498 ms, LVH with repolarization abnormalities, RAE with peaked P waves, poor R wave progression in V1-III  - no acute changes.  VITALS Reviewed today   Temp Readings from Last 3 Encounters:  10/24/20 97.6 F (36.4 C) (Oral)  10/23/20 98.3 F (36.8 C) (Oral)  09/18/20 97.7 F (36.5 C)   BP Readings from Last 3 Encounters:  10/25/20 110/60  10/24/20 (!) 106/59  10/23/20 (!) 93/47   Pulse Readings from Last 3 Encounters:  10/25/20 75  10/24/20 70  10/23/20 68    Wt Readings from Last 3 Encounters:  10/25/20 154 lb 8 oz (70.1 kg)  10/24/20 152 lb 6.4 oz (69.1 kg)  10/23/20 152 lb (68.9 kg)     LABS  reviewed today   Lab Results  Component Value Date   WBC 7.4 09/10/2020   HGB 12.2 (L) 09/10/2020   HCT 35.3 (L) 09/10/2020   MCV 86.5 09/10/2020   PLT 184 09/10/2020   Lab Results  Component Value Date   CREATININE 0.85 09/27/2020   BUN 14 09/27/2020   NA 141 09/27/2020   K 4.9 09/27/2020   CL 102 09/27/2020   CO2 23 09/27/2020   Lab Results  Component Value Date   ALT 9 09/07/2020   AST 21 09/07/2020   ALKPHOS 60 09/07/2020   BILITOT 1.0 09/07/2020   Lab Results  Component Value Date   CHOL 109 09/08/2020   HDL 29 (L) 09/08/2020   LDLCALC 69 09/08/2020   TRIG 53 09/08/2020   CHOLHDL 3.8 09/08/2020    Lab Results  Component Value Date   HGBA1C 4.8 05/15/2017   Lab Results  Component Value Date   TSH 3.265 09/08/2020     STUDIES/PROCEDURES reviewed today    Zio AT 09/27/2020 Patient had a min HR of 46 bpm, max HR of 171 bpm, and avg HR of 78 bpm. Predominant underlying rhythm was Sinus Rhythm. Bundle Branch Block/IVCD was present. 2 Supraventricular Tachycardia runs occurred, the run with the fastest interval lasting 5  beats  with a max rate of 171 bpm (avg 169 bpm); the run with the fastest interval was also the longest. Isolated SVEs were rare (<1.0%), SVE Couplets were rare (<1.0%), and SVE Triplets were rare (<1.0%). Isolated VEs were rare (<1.0%), VE Couplets were rare (<1.0%), and no VE Triplets were present. No evidence of significant arrhythmias such as ventricular tachycardia noted  2D echo 09/09/2020:  1. Left ventricular ejection fraction, by estimation, is 45 to 50%. The  left ventricle has mildly decreased function. The left ventricle has no  regional wall motion abnormalities. Left ventricular diastolic parameters  were normal.   2. Right ventricular systolic function is normal. The right ventricular  size is normal. There is normal pulmonary artery systolic pressure.   3. The mitral valve is grossly normal. Trivial mitral valve  regurgitation.   4. The aortic valve is grossly normal. Aortic valve regurgitation is not  visualized. Mild aortic valve sclerosis is present, with no evidence of  aortic valve stenosis.   5. The inferior vena cava is normal in size with greater than 50%  respiratory variability, suggesting right atrial pressure of 3 mmHg. __________   LHC 09/11/2020: Conclusions: Mild to moderate, nonobstructive coronary artery disease.  No critical lesion identified to explain patient's ventricular tachycardia and/or cardiomyopathy. Normal left ventricular filling pressure.   Recommendations: Medical therapy and risk factor modification to prevent progression of coronary artery disease. EP consultation for further recommendations regarding management of syncope and ventricular tachycardia.   Assessment & Plan    Polymorphic VT -- No recent tachypalpitations.  No further syncopal episodes.  No presyncope.  Since oncology has changed his medications, he denies further diarrhea.  EKG today without VT, though continues to show prolonged QTC. Zio AT monitor reviewed as above and  without significant ventricular arrhythmia.  No indication to refer to EP at this time. We will recheck electrolytes today, per patient request.  If electrolytes WNL, he will follow-up with his [place] cardiologist moving forward.  Prolonged QT  -- QT today prolonged.  He does have known history of left bundle branch block.  Per review of medication list, he is on mirtazapine with recommendation that QT prolonging medications be avoided at this time to reduce the risk of harmful arrhythmia moving forward.  Recommend follow-up EKG per primary cardiologist/[facility] cardiologist and close monitoring of QT moving forward.  Ensure electrolytes at goal.  No significant arrhythmia on live Zio monitoring as above, which is reassuring.  As above, we plan to recheck a BMET today.  Nonischemic cardiomyopathy/HFrEF -No reported shortness of breath.  EF mildly reduced by echo obtained during admission.  LHC with mild to moderate nonobstructive CAD.  As above, he is not on a beta-blocker due to history of bradycardic rates and soft BP.  ACE/ARB/Arni/MRA deferred due to hypotension as well.  He continues to have lower extremity edema and suspect at least some element of dependent edema.  Will defer any initiation of diuretic to primary cardiologist and wants he has been without diarrhea for some time.  Continue to defer diuresis today given his ongoing soft pressures and comorbid conditions, and as he wishes to follow-up with his [facility] MD moving forward.  Elevation and compression stockings discussed.  Will defer from any medication changes at this time given patient preference to follow-up with his [facility] MD moving forward.  Nonobstructive CAD -- No symptoms concerning for angina.  EKG without acute ST/T changes.  LHC as above without obstructive disease.  Continue GDMT as tolerated by heart rate and blood pressure.  Aggressive risk factor modification recommended.  Follow-up with [facility] MD as recommended.  Lung cancer -- Continues to  follow with the [place]. he reports resolution of previous diarrhea with medication changes made by his oncologist.  Medication changes: None Labs ordered: BMET per patient request Studies / Imaging ordered: None Future considerations: None Disposition: [facility] with [facility] as recommended  *  Please be aware that the above documentation was completed voice recognition software and may contain dictation errors.      Arvil Chaco, PA-C

## 2020-10-25 NOTE — Patient Instructions (Signed)
Medication Instructions:  Your physician recommends that you continue on your current medications as directed. Please refer to the Current Medication list given to you today.  *If you need a refill on your cardiac medications before your next appointment, please call your pharmacy*   Lab Work:  BMET today  Testing/Procedures: None ordered   Follow-Up: At [facility], you and your health needs are our priority.  As part of our continuing mission to provide you with exceptional heart care, we have created designated Provider Care Teams.  These Care Teams include your primary Cardiologist (physician) and Advanced Practice Providers (APPs -  Physician Assistants and Nurse Practitioners) who all work together to provide you with the care you need, when you need it.  We recommend signing up for the patient portal called "MyChart".  Sign up information is provided on this After Visit Summary.  MyChart is used to connect with patients for Virtual Visits (Telemedicine).  Patients are able to view lab/test results, encounter notes, upcoming appointments, etc.  Non-urgent messages can be sent to your provider as well.   To learn more about what you can do with MyChart, go to [email or web address].    Your next appointment:    Follow up as needed

## 2020-10-26 LAB — BASIC METABOLIC PANEL
BUN/Creatinine Ratio: 16 (ref 10–24)
BUN: 12 mg/dL (ref 8–27)
CO2: 27 mmol/L (ref 20–29)
Calcium: 8.8 mg/dL (ref 8.6–10.2)
Chloride: 102 mmol/L (ref 96–106)
Creatinine, Ser: 0.76 mg/dL (ref 0.76–1.27)
Glucose: 81 mg/dL (ref 65–99)
Potassium: 4 mmol/L (ref 3.5–5.2)
Sodium: 142 mmol/L (ref 134–144)
eGFR: 93 mL/min/{1.73_m2} (ref >59)
